# Patient Record
Sex: Female | Born: 1970 | Race: White | Hispanic: No | Marital: Married | Smoking: Never smoker
Health system: Southern US, Community
[De-identification: ages and names within clinical notes are randomized; demographics above are authoritative.]

## PROBLEM LIST (undated history)

## (undated) ENCOUNTER — Emergency Department (HOSPITAL_COMMUNITY): Payer: Self-pay | Source: Home / Self Care

## (undated) DIAGNOSIS — Q059 Spina bifida, unspecified: Secondary | ICD-10-CM

## (undated) DIAGNOSIS — T883XXA Malignant hyperthermia due to anesthesia, initial encounter: Secondary | ICD-10-CM

## (undated) DIAGNOSIS — Q0701 Arnold-Chiari syndrome with spina bifida: Secondary | ICD-10-CM

## (undated) DIAGNOSIS — G919 Hydrocephalus, unspecified: Secondary | ICD-10-CM

## (undated) DIAGNOSIS — Z9889 Other specified postprocedural states: Secondary | ICD-10-CM

## (undated) DIAGNOSIS — R112 Nausea with vomiting, unspecified: Secondary | ICD-10-CM

## (undated) DIAGNOSIS — M1990 Unspecified osteoarthritis, unspecified site: Secondary | ICD-10-CM

## (undated) DIAGNOSIS — K219 Gastro-esophageal reflux disease without esophagitis: Secondary | ICD-10-CM

## (undated) DIAGNOSIS — Z8489 Family history of other specified conditions: Secondary | ICD-10-CM

## (undated) DIAGNOSIS — G709 Myoneural disorder, unspecified: Secondary | ICD-10-CM

## (undated) HISTORY — DX: Hydrocephalus, unspecified: G91.9

## (undated) HISTORY — DX: Unspecified osteoarthritis, unspecified site: M19.90

## (undated) HISTORY — PX: ANKLE ARTHROSCOPY: SUR85

## (undated) HISTORY — DX: Arnold-Chiari syndrome with spina bifida: Q07.01

## (undated) HISTORY — PX: WISDOM TOOTH EXTRACTION: SHX21

## (undated) HISTORY — DX: Gastro-esophageal reflux disease without esophagitis: K21.9

## (undated) HISTORY — DX: Spina bifida, unspecified: Q05.9

## (undated) HISTORY — PX: SHUNT EXTERNALIZATION: SHX341

---
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
## 1999-09-16 ENCOUNTER — Encounter: Admission: RE | Admit: 1999-09-16 | Discharge: 1999-09-16 | Payer: Self-pay | Admitting: Neurosurgery

## 2000-01-29 ENCOUNTER — Other Ambulatory Visit: Admission: RE | Admit: 2000-01-29 | Discharge: 2000-01-29 | Payer: Self-pay | Admitting: *Deleted

## 2000-04-02 ENCOUNTER — Encounter: Payer: Self-pay | Admitting: Neurosurgery

## 2000-04-02 ENCOUNTER — Encounter: Admission: RE | Admit: 2000-04-02 | Discharge: 2000-04-02 | Payer: Self-pay | Admitting: Neurosurgery

## 2000-04-24 ENCOUNTER — Encounter: Payer: Self-pay | Admitting: Orthopaedic Surgery

## 2000-04-24 ENCOUNTER — Encounter: Admission: RE | Admit: 2000-04-24 | Discharge: 2000-04-24 | Payer: Self-pay | Admitting: Orthopaedic Surgery

## 2000-05-18 ENCOUNTER — Emergency Department (HOSPITAL_COMMUNITY): Admission: EM | Admit: 2000-05-18 | Discharge: 2000-05-18 | Payer: Self-pay | Admitting: Emergency Medicine

## 2000-05-19 ENCOUNTER — Encounter: Payer: Self-pay | Admitting: Emergency Medicine

## 2000-10-02 ENCOUNTER — Encounter: Payer: Self-pay | Admitting: *Deleted

## 2000-10-02 ENCOUNTER — Encounter (HOSPITAL_COMMUNITY): Admission: RE | Admit: 2000-10-02 | Discharge: 2000-12-31 | Payer: Self-pay | Admitting: *Deleted

## 2000-10-13 ENCOUNTER — Encounter: Payer: Self-pay | Admitting: *Deleted

## 2000-10-27 ENCOUNTER — Encounter: Payer: Self-pay | Admitting: *Deleted

## 2000-11-03 ENCOUNTER — Encounter: Payer: Self-pay | Admitting: *Deleted

## 2000-11-10 ENCOUNTER — Encounter: Payer: Self-pay | Admitting: *Deleted

## 2000-11-25 ENCOUNTER — Encounter: Payer: Self-pay | Admitting: *Deleted

## 2000-12-03 ENCOUNTER — Inpatient Hospital Stay (HOSPITAL_COMMUNITY): Admission: AD | Admit: 2000-12-03 | Discharge: 2000-12-03 | Payer: Self-pay | Admitting: Obstetrics

## 2000-12-08 ENCOUNTER — Encounter: Payer: Self-pay | Admitting: *Deleted

## 2000-12-13 ENCOUNTER — Inpatient Hospital Stay (HOSPITAL_COMMUNITY): Admission: AD | Admit: 2000-12-13 | Discharge: 2000-12-13 | Payer: Self-pay | Admitting: Obstetrics & Gynecology

## 2000-12-14 ENCOUNTER — Inpatient Hospital Stay (HOSPITAL_COMMUNITY): Admission: AD | Admit: 2000-12-14 | Discharge: 2000-12-14 | Payer: Self-pay | Admitting: Obstetrics

## 2000-12-22 ENCOUNTER — Inpatient Hospital Stay (HOSPITAL_COMMUNITY): Admission: AD | Admit: 2000-12-22 | Discharge: 2000-12-25 | Payer: Self-pay | Admitting: *Deleted

## 2001-01-07 ENCOUNTER — Encounter (HOSPITAL_COMMUNITY): Admission: RE | Admit: 2001-01-07 | Discharge: 2001-03-22 | Payer: Self-pay | Admitting: *Deleted

## 2001-01-11 ENCOUNTER — Inpatient Hospital Stay (HOSPITAL_COMMUNITY): Admission: AD | Admit: 2001-01-11 | Discharge: 2001-01-12 | Payer: Self-pay | Admitting: *Deleted

## 2001-01-12 ENCOUNTER — Encounter: Payer: Self-pay | Admitting: *Deleted

## 2001-03-11 ENCOUNTER — Inpatient Hospital Stay (HOSPITAL_COMMUNITY): Admission: AD | Admit: 2001-03-11 | Discharge: 2001-03-11 | Payer: Self-pay | Admitting: Obstetrics & Gynecology

## 2001-03-12 ENCOUNTER — Inpatient Hospital Stay (HOSPITAL_COMMUNITY): Admission: AD | Admit: 2001-03-12 | Discharge: 2001-03-12 | Payer: Self-pay | Admitting: *Deleted

## 2001-03-21 ENCOUNTER — Inpatient Hospital Stay (HOSPITAL_COMMUNITY): Admission: AD | Admit: 2001-03-21 | Discharge: 2001-03-24 | Payer: Self-pay | Admitting: *Deleted

## 2001-03-28 ENCOUNTER — Inpatient Hospital Stay (HOSPITAL_COMMUNITY): Admission: AD | Admit: 2001-03-28 | Discharge: 2001-03-28 | Payer: Self-pay | Admitting: Obstetrics & Gynecology

## 2001-04-01 ENCOUNTER — Encounter: Admission: RE | Admit: 2001-04-01 | Discharge: 2001-06-02 | Payer: Self-pay | Admitting: Sports Medicine

## 2001-04-30 ENCOUNTER — Inpatient Hospital Stay (HOSPITAL_COMMUNITY): Admission: AD | Admit: 2001-04-30 | Discharge: 2001-04-30 | Payer: Self-pay | Admitting: *Deleted

## 2001-04-30 ENCOUNTER — Encounter (INDEPENDENT_AMBULATORY_CARE_PROVIDER_SITE_OTHER): Payer: Self-pay | Admitting: Specialist

## 2003-09-28 ENCOUNTER — Encounter: Admission: RE | Admit: 2003-09-28 | Discharge: 2003-09-28 | Payer: Self-pay | Admitting: Family Medicine

## 2004-05-28 ENCOUNTER — Encounter: Admission: RE | Admit: 2004-05-28 | Discharge: 2004-05-28 | Payer: Self-pay | Admitting: Neurosurgery

## 2004-12-13 ENCOUNTER — Emergency Department (HOSPITAL_COMMUNITY): Admission: EM | Admit: 2004-12-13 | Discharge: 2004-12-13 | Payer: Self-pay | Admitting: Emergency Medicine

## 2005-06-04 ENCOUNTER — Encounter: Admission: RE | Admit: 2005-06-04 | Discharge: 2005-06-04 | Payer: Self-pay | Admitting: Neurosurgery

## 2005-06-24 ENCOUNTER — Emergency Department (HOSPITAL_COMMUNITY): Admission: EM | Admit: 2005-06-24 | Discharge: 2005-06-24 | Payer: Self-pay | Admitting: Emergency Medicine

## 2005-08-01 ENCOUNTER — Inpatient Hospital Stay (HOSPITAL_COMMUNITY): Admission: RE | Admit: 2005-08-01 | Discharge: 2005-08-05 | Payer: Self-pay | Admitting: Neurosurgery

## 2005-08-15 ENCOUNTER — Emergency Department (HOSPITAL_COMMUNITY): Admission: EM | Admit: 2005-08-15 | Discharge: 2005-08-16 | Payer: Self-pay | Admitting: Emergency Medicine

## 2005-09-25 ENCOUNTER — Encounter: Admission: RE | Admit: 2005-09-25 | Discharge: 2005-09-25 | Payer: Self-pay | Admitting: Neurosurgery

## 2005-11-07 ENCOUNTER — Encounter: Admission: RE | Admit: 2005-11-07 | Discharge: 2005-11-07 | Payer: Self-pay | Admitting: Neurosurgery

## 2005-11-17 ENCOUNTER — Encounter: Admission: RE | Admit: 2005-11-17 | Discharge: 2005-11-17 | Payer: Self-pay | Admitting: Neurosurgery

## 2005-11-27 ENCOUNTER — Encounter: Admission: RE | Admit: 2005-11-27 | Discharge: 2005-11-27 | Payer: Self-pay | Admitting: Neurosurgery

## 2006-01-23 ENCOUNTER — Encounter: Admission: RE | Admit: 2006-01-23 | Discharge: 2006-01-23 | Payer: Self-pay | Admitting: Neurosurgery

## 2006-08-31 ENCOUNTER — Encounter: Admission: RE | Admit: 2006-08-31 | Discharge: 2006-08-31 | Payer: Self-pay | Admitting: Neurosurgery

## 2007-02-10 ENCOUNTER — Encounter: Admission: RE | Admit: 2007-02-10 | Discharge: 2007-02-10 | Payer: Self-pay | Admitting: Neurosurgery

## 2007-03-01 ENCOUNTER — Encounter: Admission: RE | Admit: 2007-03-01 | Discharge: 2007-03-01 | Payer: Self-pay | Admitting: Neurosurgery

## 2007-06-11 ENCOUNTER — Encounter: Admission: RE | Admit: 2007-06-11 | Discharge: 2007-06-11 | Payer: Self-pay | Admitting: Neurosurgery

## 2007-08-18 ENCOUNTER — Encounter: Admission: RE | Admit: 2007-08-18 | Discharge: 2007-08-18 | Payer: Self-pay | Admitting: Neurosurgery

## 2008-01-11 ENCOUNTER — Encounter: Admission: RE | Admit: 2008-01-11 | Discharge: 2008-01-11 | Payer: Self-pay | Admitting: Neurosurgery

## 2008-07-04 ENCOUNTER — Encounter: Admission: RE | Admit: 2008-07-04 | Discharge: 2008-07-04 | Payer: Self-pay | Admitting: Neurosurgery

## 2009-03-05 ENCOUNTER — Encounter: Admission: RE | Admit: 2009-03-05 | Discharge: 2009-03-05 | Payer: Self-pay | Admitting: Neurosurgery

## 2009-06-01 ENCOUNTER — Encounter: Admission: RE | Admit: 2009-06-01 | Discharge: 2009-06-01 | Payer: Self-pay | Admitting: Neurosurgery

## 2009-06-06 ENCOUNTER — Encounter: Admission: RE | Admit: 2009-06-06 | Discharge: 2009-06-06 | Payer: Self-pay | Admitting: Neurosurgery

## 2009-06-20 ENCOUNTER — Encounter: Admission: RE | Admit: 2009-06-20 | Discharge: 2009-06-20 | Payer: Self-pay | Admitting: Neurosurgery

## 2010-08-26 ENCOUNTER — Encounter: Admission: RE | Admit: 2010-08-26 | Discharge: 2010-08-26 | Payer: Self-pay | Admitting: Neurosurgery

## 2010-10-23 ENCOUNTER — Encounter
Admission: RE | Admit: 2010-10-23 | Discharge: 2010-11-28 | Payer: Self-pay | Source: Home / Self Care | Attending: Neurosurgery | Admitting: Neurosurgery

## 2010-11-20 ENCOUNTER — Encounter
Admission: RE | Admit: 2010-11-20 | Discharge: 2010-12-31 | Payer: Self-pay | Source: Home / Self Care | Attending: Neurosurgery | Admitting: Neurosurgery

## 2010-12-01 HISTORY — PX: BACK SURGERY: SHX140

## 2010-12-03 ENCOUNTER — Encounter
Admission: RE | Admit: 2010-12-03 | Discharge: 2010-12-31 | Payer: Self-pay | Source: Home / Self Care | Attending: Neurosurgery | Admitting: Neurosurgery

## 2010-12-22 ENCOUNTER — Encounter: Payer: Self-pay | Admitting: Orthopaedic Surgery

## 2010-12-22 ENCOUNTER — Encounter: Payer: Self-pay | Admitting: Neurosurgery

## 2011-01-01 ENCOUNTER — Ambulatory Visit: Payer: Self-pay | Admitting: Physical Therapy

## 2011-01-03 ENCOUNTER — Ambulatory Visit: Payer: Self-pay | Admitting: Physical Therapy

## 2011-01-07 ENCOUNTER — Ambulatory Visit: Payer: Self-pay | Admitting: Physical Therapy

## 2011-01-07 ENCOUNTER — Ambulatory Visit: Payer: 59 | Attending: Neurosurgery | Admitting: Physical Therapy

## 2011-01-07 DIAGNOSIS — R269 Unspecified abnormalities of gait and mobility: Secondary | ICD-10-CM | POA: Insufficient documentation

## 2011-01-07 DIAGNOSIS — M6281 Muscle weakness (generalized): Secondary | ICD-10-CM | POA: Insufficient documentation

## 2011-01-07 DIAGNOSIS — IMO0001 Reserved for inherently not codable concepts without codable children: Secondary | ICD-10-CM | POA: Insufficient documentation

## 2011-01-09 ENCOUNTER — Ambulatory Visit: Payer: Self-pay | Admitting: Physical Therapy

## 2011-01-09 ENCOUNTER — Ambulatory Visit: Payer: 59 | Admitting: Physical Therapy

## 2011-01-13 ENCOUNTER — Ambulatory Visit: Payer: Self-pay | Admitting: Physical Therapy

## 2011-01-15 ENCOUNTER — Ambulatory Visit: Payer: 59 | Admitting: Physical Therapy

## 2011-01-20 ENCOUNTER — Ambulatory Visit: Payer: Self-pay | Admitting: Physical Therapy

## 2011-01-22 ENCOUNTER — Ambulatory Visit: Payer: 59 | Admitting: Physical Therapy

## 2011-01-24 ENCOUNTER — Ambulatory Visit: Payer: 59 | Admitting: Physical Therapy

## 2011-01-27 ENCOUNTER — Ambulatory Visit: Payer: 59 | Admitting: Physical Therapy

## 2011-01-30 ENCOUNTER — Ambulatory Visit: Payer: 59 | Attending: Neurosurgery | Admitting: Physical Therapy

## 2011-01-30 DIAGNOSIS — IMO0001 Reserved for inherently not codable concepts without codable children: Secondary | ICD-10-CM | POA: Insufficient documentation

## 2011-01-30 DIAGNOSIS — R269 Unspecified abnormalities of gait and mobility: Secondary | ICD-10-CM | POA: Insufficient documentation

## 2011-01-30 DIAGNOSIS — M6281 Muscle weakness (generalized): Secondary | ICD-10-CM | POA: Insufficient documentation

## 2011-02-04 ENCOUNTER — Ambulatory Visit: Payer: 59 | Admitting: Physical Therapy

## 2011-02-11 ENCOUNTER — Ambulatory Visit: Payer: 59 | Admitting: Physical Therapy

## 2011-02-17 ENCOUNTER — Ambulatory Visit: Payer: 59 | Admitting: Physical Therapy

## 2011-02-18 ENCOUNTER — Ambulatory Visit: Payer: 59 | Admitting: Physical Therapy

## 2011-04-18 NOTE — Op Note (Signed)
[name], RANDLE NO.:  [id number]   MEDICAL RECORD NO.:  [id number]          PATIENT TYPE:  INP   LOCATION:  2861                         FACILITY:  [facility]   PHYSICIAN:  Payton Doughty, M.D.      DATE OF BIRTH:  1971/08/06   DATE OF PROCEDURE:  08/01/2005  DATE OF DISCHARGE:                                 OPERATIVE REPORT   PREOPERATIVE DIAGNOSIS:  Hydrocephalus.   POSTOPERATIVE DIAGNOSIS:  Hydrocephalus.   OPERATION PERFORMED:  Right frontal ventriculoperitoneal shunt placement.   SURGEON:  Payton Doughty, M.D.   ANESTHESIA:  General endotracheal.   PREP:  Sterile Betadine prep and scrub with alcohol wipe.   COMPLICATIONS:  None.   NURSE ASSISTANT:  [place].   DOCTOR ASSISTANT:  Hilda Lias, M.D.   INDICATIONS FOR PROCEDURE:  The patient is a 40 year old female with  hydrocephalus.  She has not had a shunt for 35 years.   DESCRIPTION OF PROCEDURE:  The patient was taken to the operating room,  smoothly anesthetized, intubated.  Precautions were taken for malignant  hyperthermia and latex allergy.  Following shave, prep and drape in the  usual sterile fashion, skin was infiltrated with 1% lidocaine with  epinephrine 1:400,000 epinephrine and a small horseshoe shaped skin flap was  created in the right frontal area and a bur hole created.  Through this was  passed a catheter into the right frontal horn.  Estimated pressure was about  120 upon ventriculostomy entering the ventricle.  Tunnel was then created to  the right upper quadrant of the abdomen where through paramidline incision,  the rectus sheath, rectus abdominis muscle and the peritoneum were  identified, opened, confirmation of intestine was seen.  The adjustable  valve in line system was attached to the ventricular catheter which was  placed to 7 cm.  The catheter had been set at 100.  The tubing was passed  through the subcutaneous tunnel, passed into the abdomen with free flow of  CSF  noted.  Both incisions were irrigated and hemostasis assured and closed  with successive layers of 2-0 Vicryl and 3-0 nylon.  Betadine Telfa  dressings were applied.  The patient returned to recovery room in good  condition.           ______________________________  Payton Doughty, M.D.    MWR/MEDQ  D:  08/01/2005  T:  08/01/2005  Job:  [id number]

## 2011-04-18 NOTE — Discharge Summary (Signed)
[name], [name] NO.:  [id number]   MEDICAL RECORD NO.:  [id number]          PATIENT TYPE:  INP   LOCATION:  3005                         FACILITY:  [facility]   PHYSICIAN:  Payton Doughty, M.D.      DATE OF BIRTH:  08/17/1971   DATE OF ADMISSION:  08/01/2005  DATE OF DISCHARGE:  08/05/2005                                 DISCHARGE SUMMARY   ADMITTING DIAGNOSIS:  Hydrocephalus.   DISCHARGE DIAGNOSIS:  Hydrocephalus.   PROCEDURE:  Right frontal ventricular peritoneal shunt.   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  The shunt has not been  revised for 35 years.   Medical history is otherwise benign.   PHYSICAL EXAMINATION:  GENERAL:  Remarkable for obesity.  NEUROLOGIC:  Basically intact with head titubations and Lhermitte's  phenomenon with Valsalva.   She was admitted.  After ascertaining normal laboratory values, she  underwent a right frontal VP shunt.  Postoperatively she has done well.  She  spent the first couple of days in the ICU.  She had some complaints of  headache.  She also was febrile.  During the time of her fever, CBC, C-  reactive protein, chest x-ray , and urinalysis were negative.  Currently her  temperature is 98.4 to 99.5.  On exam she is awake, alert, and oriented.  Cranial nerves are intact.  There is no evidence of papilledema.  Incisions  are dry and well-healed.  She is being discharged home in the care of her  family.  Her followup will be in [facility] office in  10 days.           ______________________________  Payton Doughty, M.D.     MWR/MEDQ  D:  08/05/2005  T:  08/05/2005  Job:  [id number]

## 2011-04-18 NOTE — H&P (Signed)
NAMERASHENA, [name] NO.:  [id number]   MEDICAL RECORD NO.:  [id number]          PATIENT TYPE:  INP   LOCATION:                               FACILITY:  [facility]   PHYSICIAN:  Payton Doughty, M.D.      DATE OF BIRTH:  1971-11-21   DATE OF ADMISSION:  08/01/2005  DATE OF DISCHARGE:                                HISTORY & PHYSICAL   ADMITTING DIAGNOSIS:  Hydrocephalus.   HISTORY OF PRESENT ILLNESS:  This is a now 40 year old right-handed white  lady who had a history of myelomeningocele repair as a child, she was  shunted as an infant, been doing well, has some difficulty with lumbar  spondylosis, low back pain.  Visited with her periodically over the years  and then recently she has developed headaches that are Valsalva related, she  has limited titubation of the head and she complains of progressive weakness  in her arms and legs.  She had an MRI that demonstrated hydrocephalus and  the plan is for a right frontoventricular peritoneal shunt.   MEDICAL HISTORY:  Her medical history is remarkable for myelomeningocele  repaired as a child; she has had no abdominal operations.   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   SOCIAL HISTORY:  She does not smoke and does not drink.  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   FAMILY HISTORY:  Mom is 27 and with leukemia and malignant hyperthermia.  Father is 65, has cervical spondylosis and ___________.   REVIEW OF SYSTEMS:  Review of systems is remarkable for headache, dizziness,  dystaxia.   PHYSICAL EXAMINATION:  HEENT:  Within normal limits.  She does not have  papilledema.  She has reasonably good range of motion in her neck.  CHEST:  Chest clear.  Cardiac exam is regular rate and rhythm, heart rate is  in the 60s.  ABDOMEN:  Her abdomen is nontender; no hepatosplenomegaly.  EXTREMITIES:  Without clubbing or cyanosis.  Peripheral pulses are good.  NEUROLOGICAL:  She is awake, alert and oriented.  Cranial nerves are intact.  She does not have ___________ syndrome.  Pupils equal, round, reactive to  light.  Extraocular movements are intact.  Facial movements and sensation  are intact.  Tongue protrudes in the midline.  Shoulder shrug is normal.  She describes no swallowing difficulties.  Motor exam demonstrates 5/5  strength throughout the upper and lower extremities; she is slightly  dysmetric bilaterally.  Lower extremity strength she has weakness of the  dorsi and plantar flexors which is stable at her baseline.  GU:  Exam deferred.   CLINICAL DATA:  Her MR demonstrates hydrocephalus as noted above.   IMPRESSION:  There is compression of the brainstem secondary to Chiari.   PLAN:  The plan is for placement of a ventriculoperitoneal shunt.  We will  put in a programmable shunt and try to  set her at about 100.           ______________________________  Payton Doughty, M.D.     MWR/MEDQ  D:  08/01/2005  T:  08/01/2005  Job:  [id number]

## 2011-12-05 ENCOUNTER — Ambulatory Visit: Payer: 59

## 2011-12-05 DIAGNOSIS — J0100 Acute maxillary sinusitis, unspecified: Secondary | ICD-10-CM

## 2011-12-09 ENCOUNTER — Other Ambulatory Visit: Payer: Self-pay | Admitting: Neurosurgery

## 2011-12-17 ENCOUNTER — Other Ambulatory Visit: Payer: 59

## 2014-03-07 ENCOUNTER — Other Ambulatory Visit: Payer: Self-pay | Admitting: Neurosurgery

## 2014-03-07 DIAGNOSIS — M79605 Pain in left leg: Secondary | ICD-10-CM

## 2014-03-07 DIAGNOSIS — M79604 Pain in right leg: Secondary | ICD-10-CM

## 2014-03-07 DIAGNOSIS — M791 Myalgia, unspecified site: Secondary | ICD-10-CM

## 2014-03-09 ENCOUNTER — Ambulatory Visit
Admission: RE | Admit: 2014-03-09 | Discharge: 2014-03-09 | Disposition: A | Discharge status: Home / Self Care | Payer: 59 | Source: Ambulatory Visit | Attending: Neurosurgery | Admitting: Neurosurgery

## 2014-03-09 DIAGNOSIS — M79604 Pain in right leg: Secondary | ICD-10-CM

## 2014-03-09 DIAGNOSIS — M791 Myalgia, unspecified site: Secondary | ICD-10-CM

## 2014-03-09 DIAGNOSIS — M79605 Pain in left leg: Secondary | ICD-10-CM

## 2016-07-03 ENCOUNTER — Ambulatory Visit: Payer: Self-pay

## 2016-07-03 ENCOUNTER — Ambulatory Visit (INDEPENDENT_AMBULATORY_CARE_PROVIDER_SITE_OTHER): Payer: 59 | Admitting: Podiatry

## 2016-07-03 ENCOUNTER — Ambulatory Visit (INDEPENDENT_AMBULATORY_CARE_PROVIDER_SITE_OTHER): Payer: 59

## 2016-07-03 ENCOUNTER — Encounter: Payer: Self-pay | Admitting: Podiatry

## 2016-07-03 VITALS — BP 115/67 | HR 86 | Resp 16 | Ht 60.0 in | Wt 231.0 lb

## 2016-07-03 DIAGNOSIS — M722 Plantar fascial fibromatosis: Secondary | ICD-10-CM | POA: Diagnosis not present

## 2016-07-03 DIAGNOSIS — M79671 Pain in right foot: Secondary | ICD-10-CM | POA: Diagnosis not present

## 2016-07-03 DIAGNOSIS — M79672 Pain in left foot: Secondary | ICD-10-CM

## 2016-07-03 MED ORDER — TRIAMCINOLONE ACETONIDE 10 MG/ML IJ SUSP
10.0000 mg | Freq: Once | INTRAMUSCULAR | Status: AC
  Administered 2016-07-03: 10 mg

## 2016-07-03 NOTE — Patient Instructions (Signed)

## 2016-07-03 NOTE — Progress Notes (Signed)
   Subjective:    Patient ID: [name], female    DOB: [birth year], 45 y.o.   MRN: [id number]  HPI Chief Complaint  Patient presents with  . Foot Pain    Bilateral; heel & arch; pt stated, "Right foot swells all the time"; x1 month      Review of Systems  HENT: Positive for tinnitus.   Cardiovascular: Positive for leg swelling.  Neurological: Positive for dizziness and headaches.  All other systems reviewed and are negative.      Objective:   Physical Exam        Assessment & Plan:

## 2016-07-03 NOTE — Progress Notes (Signed)
Subjective:     Patient ID: [name], female   DOB: 1971-09-22, 46 y.o.   MRN: [id number]  HPI patient presents stating that she has developed heel pain in both heels and that she does have spina bifida which has started to become more symptomatic over the last 10 years causing her to use a walker   Review of Systems  All other systems reviewed and are negative.      Objective:   Physical Exam  Constitutional: She is oriented to person, place, and time.  Cardiovascular: Intact distal pulses.   Musculoskeletal: Normal range of motion.  Neurological: She is oriented to person, place, and time.  Skin: Skin is warm.  Nursing note and vitals reviewed.  neurovascular status was found to be intact with significant lower leg lymphedema secondary to pathological systemic condition with discomfort in the plantar heel region bilateral with fluid buildup around the medial tendon at the insertion into the calcaneus. Patient's found to have good digital perfusion and is well oriented 3     Assessment:     Plantar fasciitis bilateral with inflammation fluid buildup and lymphedema which does put more stress on the legs    Plan:     H&P and x-rays reviewed. Today I injected the plantar fascia bilateral 3 mg Kenalog 5 mg Xylocaine and discussed long-term orthotics. Patient be seen back for [place] to recheck again in the next several weeks or earlier if any issues should occur  X-rays reveal significant lower limb edema with no other significant pathology noted

## 2016-07-17 ENCOUNTER — Ambulatory Visit (INDEPENDENT_AMBULATORY_CARE_PROVIDER_SITE_OTHER): Payer: 59 | Admitting: Podiatry

## 2016-07-17 DIAGNOSIS — M722 Plantar fascial fibromatosis: Secondary | ICD-10-CM | POA: Diagnosis not present

## 2016-07-17 DIAGNOSIS — M79672 Pain in left foot: Secondary | ICD-10-CM

## 2016-07-17 MED ORDER — TRIAMCINOLONE ACETONIDE 10 MG/ML IJ SUSP
10.0000 mg | Freq: Once | INTRAMUSCULAR | Status: AC
  Administered 2016-07-17: 10 mg

## 2016-07-17 NOTE — Progress Notes (Signed)
Subjective:     Patient ID: Renee FreesBarbara L Bergeron, female   DOB: 12-14-1970, 45 y.o.   MRN: [id number]  HPI patient has plantar fasciitis of the heels that is improved but still quite tender when palpated   Review of Systems     Objective:   Physical Exam Neurovascular status intact muscle strength adequate range of motion within normal limits with patient found to have discomfort still in the plantar heels bilateral with fluid buildup noted. Patient has moderate depression of the arch and does have history of spina bifida which causes changes in gait    Assessment:     Plantar fasciitis bilateral with inflammation     Plan:     Reinjected the plantar fascia bilateral 3 mg Kenalog 5 mg Xylocaine and scanned for custom orthotics to reduce pressure against the feet

## 2016-08-12 ENCOUNTER — Other Ambulatory Visit: Payer: 59

## 2016-08-15 ENCOUNTER — Ambulatory Visit: Payer: 59 | Admitting: *Deleted

## 2016-08-15 DIAGNOSIS — M722 Plantar fascial fibromatosis: Secondary | ICD-10-CM

## 2016-08-15 NOTE — Patient Instructions (Signed)

## 2016-08-15 NOTE — Progress Notes (Signed)
Patient ID: Renee FreesBarbara L Beltran, female   DOB: Dec 26, 1970, 45 y.o.   MRN: [id number]  Patient presents for orthotic pick up.  Verbal and written break in and wear instructions given.  Patient will follow up in 4 weeks if symptoms worsen or fail to improve.

## 2018-04-12 ENCOUNTER — Emergency Department (HOSPITAL_COMMUNITY): Payer: No Typology Code available for payment source

## 2018-04-12 ENCOUNTER — Encounter (HOSPITAL_COMMUNITY): Payer: Self-pay | Admitting: Emergency Medicine

## 2018-04-12 ENCOUNTER — Other Ambulatory Visit: Payer: Self-pay

## 2018-04-12 ENCOUNTER — Emergency Department (HOSPITAL_COMMUNITY)
Admission: EM | Admit: 2018-04-12 | Discharge: 2018-04-12 | Disposition: A | Discharge status: Home / Self Care | Payer: No Typology Code available for payment source | Attending: Emergency Medicine | Admitting: Emergency Medicine

## 2018-04-12 DIAGNOSIS — S82851A Displaced trimalleolar fracture of right lower leg, initial encounter for closed fracture: Secondary | ICD-10-CM | POA: Insufficient documentation

## 2018-04-12 DIAGNOSIS — Z91040 Latex allergy status: Secondary | ICD-10-CM | POA: Insufficient documentation

## 2018-04-12 DIAGNOSIS — R52 Pain, unspecified: Secondary | ICD-10-CM

## 2018-04-12 DIAGNOSIS — S99911A Unspecified injury of right ankle, initial encounter: Secondary | ICD-10-CM | POA: Diagnosis present

## 2018-04-12 DIAGNOSIS — Y929 Unspecified place or not applicable: Secondary | ICD-10-CM | POA: Diagnosis not present

## 2018-04-12 DIAGNOSIS — Y939 Activity, unspecified: Secondary | ICD-10-CM | POA: Diagnosis not present

## 2018-04-12 DIAGNOSIS — Q059 Spina bifida, unspecified: Secondary | ICD-10-CM | POA: Diagnosis not present

## 2018-04-12 DIAGNOSIS — X501XXA Overexertion from prolonged static or awkward postures, initial encounter: Secondary | ICD-10-CM | POA: Insufficient documentation

## 2018-04-12 DIAGNOSIS — Y999 Unspecified external cause status: Secondary | ICD-10-CM | POA: Diagnosis not present

## 2018-04-12 HISTORY — DX: Malignant hyperthermia due to anesthesia, initial encounter: T88.3XXA

## 2018-04-12 MED ORDER — MORPHINE SULFATE (PF) 4 MG/ML IV SOLN
4.0000 mg | Freq: Once | INTRAVENOUS | Status: AC
  Administered 2018-04-12: 4 mg via INTRAVENOUS
  Filled 2018-04-12: qty 1

## 2018-04-12 MED ORDER — KETAMINE HCL 50 MG/5ML IJ SOSY
0.3000 mg/kg | PREFILLED_SYRINGE | Freq: Once | INTRAMUSCULAR | Status: AC
  Administered 2018-04-12: 37 mg via INTRAVENOUS
  Filled 2018-04-12: qty 5

## 2018-04-12 MED ORDER — HYDROCODONE-ACETAMINOPHEN 5-325 MG PO TABS
2.0000 | ORAL_TABLET | Freq: Once | ORAL | Status: AC
  Administered 2018-04-12: 2 via ORAL
  Filled 2018-04-12: qty 2

## 2018-04-12 MED ORDER — HYDROCODONE-ACETAMINOPHEN 5-325 MG PO TABS: 1.0000 | ORAL_TABLET | Freq: Four times a day (QID) | ORAL | 0 refills | Status: DC | PRN

## 2018-04-12 MED ORDER — ONDANSETRON HCL 4 MG/2ML IJ SOLN
4.0000 mg | Freq: Once | INTRAMUSCULAR | Status: AC
  Administered 2018-04-12: 4 mg via INTRAVENOUS
  Filled 2018-04-12: qty 2

## 2018-04-12 NOTE — ED Provider Notes (Signed)
[facility]-EMERGENCY DEPT Provider Note   CSN: [id number] Arrival date & time: 04/12/18  0756     History   Chief Complaint Chief Complaint  Patient presents with  . Ankle Pain    HPI [name] is a 47 y.o. female.  Patient is a 47 year old female with a history of Chiari malformation, hydrocephalus, malignant hyperthermia, spina bifida presenting today after a mechanical fall.  Patient uses a walker to ambulate and did not see a step-off on the pavement.  She twisted her right ankle and fell.  She has had severe pain 10 out of 10 in her right ankle and inability to stand and walk since this time.  She denies any knee pain or hip pain.  Her left leg feels normal.  She had no head injury or loss of consciousness.  She has chronic decreased sensation in her foot but states it feels about normal.  The history is provided by the patient.    Past Medical History:  Diagnosis Date  . Chiari malformation type II (HCC)   . GERD (gastroesophageal reflux disease)   . Hydrocephalus   . Malignant hyperthermia   . Osteoarthritis   . Spina bifida (HCC)     There are no active problems to display for this patient.   History reviewed. No pertinent surgical history.   OB History   None      Home Medications    Prior to Admission medications   Not on File    Family History No family history on file.  Social History Social History   Tobacco Use  . Smoking status: Never Smoker  . Smokeless tobacco: Never Used  Substance Use Topics  . Alcohol use: Never    Frequency: Never  . Drug use: Never     Allergies   Anesthetics, amide; Percocet [oxycodone-acetaminophen]; Latex; and Other   Review of Systems Review of Systems  All other systems reviewed and are negative.    Physical Exam Updated Vital Signs BP 129/70 (BP Location: Right Arm)   Pulse 68   Temp 98 F (36.7 C) (Oral)   Resp 16   Ht 5' (1.524 m)   Wt 122.5 kg (270 lb)    LMP 04/08/2018   SpO2 99%   BMI 52.73 kg/m   Physical Exam  Constitutional: She is oriented to person, place, and time. She appears well-developed and well-nourished. No distress.  HENT:  Head: Normocephalic and atraumatic.  Mouth/Throat: Oropharynx is clear and moist.  Eyes: Pupils are equal, round, and reactive to light. Conjunctivae and EOM are normal.  Neck: Normal range of motion. Neck supple.  Cardiovascular: Normal rate, regular rhythm and intact distal pulses.  No murmur heard. Pulmonary/Chest: Effort normal and breath sounds normal. No respiratory distress. She has no wheezes. She has no rales.  Abdominal: Soft. She exhibits no distension. There is no tenderness. There is no rebound and no guarding.  Musculoskeletal: She exhibits tenderness and deformity. She exhibits no edema.       Right ankle: She exhibits decreased range of motion, swelling, ecchymosis and deformity. She exhibits normal pulse. Tenderness. Lateral malleolus and medial malleolus tenderness found.  Pt is able to wiggle to toes and light touch is intact in the right foot  Neurological: She is alert and oriented to person, place, and time.  Skin: Skin is warm and dry. No rash noted. No erythema.  Psychiatric: She has a normal mood and affect. Her behavior is normal.  Nursing note  and vitals reviewed.    ED Treatments / Results  Labs (all labs ordered are listed, but only abnormal results are displayed) Labs Reviewed - No data to display  EKG None  Radiology Dg Ankle 2 Views Right  Result Date: 04/12/2018 CLINICAL DATA:  Post reduction EXAM: RIGHT ANKLE - 2 VIEW COMPARISON:  04/12/2018 FINDINGS: Comminuted fracture through the distal right fibular metaphysis with mild displacement. Fracture through the base of the medial malleolus. Probable posterior malleolar fracture. Disruption of the ankle mortise. Interval reduction of the previously seen dislocated talus. IMPRESSION: Trimalleolar fracture with  disruption of the ankle mortise. Interval reduction of the dislocated talus. Electronically Signed   By: Charlett Nose M.D.   On: 04/12/2018 10:03   Dg Ankle 2 Views Right  Result Date: 04/12/2018 CLINICAL DATA:  Status post fall. EXAM: RIGHT ANKLE - 2 VIEW COMPARISON:  None. FINDINGS: Generalized osteopenia. Comminuted oblique fracture of the distal fibular diaphysis with apex medial angulation and 9 mm of lateral displacement. Comminuted and displaced medial malleolar fracture with 14 mm of lateral displacement. Disruption of the ankle mortise with the talar dome dislocated laterally by 17 mm. Displaced posterior malleolar fracture of the distal tibia displaced laterally. No other fracture or dislocation. IMPRESSION: 1. Trimalleolar displaced fracture of right ankle. Lateral dislocation of the talar dome relative to the tibial plafond. Electronically Signed   By: Elige Ko   On: 04/12/2018 09:07    Procedures Reduction of fracture Date/Time: 04/12/2018 9:42 AM Performed by: Gwyneth Sprout, MD Authorized by: Gwyneth Sprout, MD  Consent: Verbal consent obtained. Consent given by: patient Patient understanding: patient states understanding of the procedure being performed Relevant documents: relevant documents present and verified Imaging studies: imaging studies available Patient identity confirmed: verbally with patient Preparation: Patient was prepped and draped in the usual sterile fashion. Local anesthesia used: no  Anesthesia: Local anesthesia used: no  Sedation: Patient sedated: yes Analgesia: morphine and ketamine Vitals: Vital signs were monitored during sedation.  Patient tolerance: Patient tolerated the procedure well with no immediate complications Comments: Patient given analgesic doses of morphine and ketamine.  No procedural sedation was done.  Reduction of fracture dislocation of the ankle.  Gentle traction and external rotation of the ankle with visually better  alignment afterwards.  A splint was placed.  2+ DP pulse present after the procedure.    (including critical care time)  Medications Ordered in ED Medications  morphine 4 MG/ML injection 4 mg (has no administration in time range)  ondansetron (ZOFRAN) injection 4 mg (has no administration in time range)     Initial Impression / Assessment and Plan / ED Course  I have reviewed the triage vital signs and the nursing notes.  Pertinent labs & imaging results that were available during my care of the patient were reviewed by me and considered in my medical decision making (see chart for details).     Patient presenting after mechanical fall with pain and deformity of the right ankle.  Concern for fracture dislocation.  Currently neurovascularly intact.  No other injury.  Ankle films are pending.  Patient given morphine.  9:41 AM X-ray shows a trimalleolar displaced fracture of the right ankle with lateral dislocation of the talar dome.  Patient given ketamine and morphine and analgesic doses and ankle was reduced at bedside and a Cadillac splint was placed.  Repeat postreduction films with improved alignment.  11:21 AM Spoke with Dr. Aundria Rud who will f/u with the pt in 1 week and  surgery in 2 Final Clinical Impressions(s) / ED Diagnoses   Final diagnoses:  Closed trimalleolar fracture of right ankle, initial encounter    ED Discharge Orders        Ordered    HYDROcodone-acetaminophen (NORCO/VICODIN) 5-325 MG tablet  Every 6 hours PRN     04/12/18 1122       Gwyneth Sprout, MD 04/12/18 1122

## 2018-04-12 NOTE — ED Notes (Signed)
EDP and ortho tech at bedside for splint placement

## 2018-04-12 NOTE — ED Notes (Signed)
Patient transported to X-ray 

## 2018-04-12 NOTE — ED Notes (Signed)
Pt denies pain, alert, relaxed.

## 2018-04-12 NOTE — ED Triage Notes (Addendum)
R ankle pain after tripping on uneven cement today at work. Denies hitting head/LOC. Splint in place by EMS.

## 2018-04-12 NOTE — Discharge Instructions (Signed)
Make sure you are not putting any weight on your foot.  Keep it elevated to help with swelling.

## 2018-04-12 NOTE — ED Notes (Signed)
Bed: [facility] Expected date:  Expected time:  Means of arrival:  Comments: 

## 2018-04-20 NOTE — Patient Instructions (Addendum)
[name]  04/20/2018   Your procedure is scheduled on: 04-23-18   Report to [facility] Main  Entrance    Report to Admitting at 5:30 AM    Call this number if you have problems the morning of surgery [phone]   Remember: Do not eat food or drink liquids :After Midnight.     Take these medicines the morning of surgery with A SIP OF WATER: None                                 You may not have any metal on your body including hair pins and              piercings  Do not wear jewelry, make-up, lotions, powders or perfumes, deodorant             Do not wear nail polish.  Do not shave  48 hours prior to surgery.                 Do not bring valuables to the hospital. [facility] IS NOT             RESPONSIBLE   FOR VALUABLES.  Contacts, dentures or bridgework may not be worn into surgery.  Leave suitcase in the car. After surgery it may be brought to your room.      Special Instructions: N/A              Please read over the following fact sheets you were given: _____________________________________________________________________          [facility] - Preparing for Surgery Before surgery, you can play an important role.  Because skin is not sterile, your skin needs to be as free of germs as possible.  You can reduce the number of germs on your skin by washing with CHG (chlorahexidine gluconate) soap before surgery.  CHG is an antiseptic cleaner which kills germs and bonds with the skin to continue killing germs even after washing. Please DO NOT use if you have an allergy to CHG or antibacterial soaps.  If your skin becomes reddened/irritated stop using the CHG and inform your nurse when you arrive at Short Stay. Do not shave (including legs and underarms) for at least 48 hours prior to the first CHG shower.  You may shave your face/neck. Please follow these instructions carefully:  1.  Shower with CHG Soap the night before surgery and the   morning of Surgery.  2.  If you choose to wash your hair, wash your hair first as usual with your  normal  shampoo.  3.  After you shampoo, rinse your hair and body thoroughly to remove the  shampoo.                           4.  Use CHG as you would any other liquid soap.  You can apply chg directly  to the skin and wash                       Gently with a scrungie or clean washcloth.  5.  Apply the CHG Soap to your body ONLY FROM THE NECK DOWN.   Do not use on face/ open  Wound or open sores. Avoid contact with eyes, ears mouth and genitals (private parts).                       Wash face,  Genitals (private parts) with your normal soap.             6.  Wash thoroughly, paying special attention to the area where your surgery  will be performed.  7.  Thoroughly rinse your body with warm water from the neck down.  8.  DO NOT shower/wash with your normal soap after using and rinsing off  the CHG Soap.                9.  Pat yourself dry with a clean towel.            10.  Wear clean pajamas.            11.  Place clean sheets on your bed the night of your first shower and do not  sleep with pets. Day of Surgery : Do not apply any lotions/deodorants the morning of surgery.  Please wear clean clothes to the hospital/surgery center.  FAILURE TO FOLLOW THESE INSTRUCTIONS MAY RESULT IN THE CANCELLATION OF YOUR SURGERY PATIENT SIGNATURE_________________________________  NURSE SIGNATURE__________________________________  ________________________________________________________________________

## 2018-04-21 ENCOUNTER — Encounter (HOSPITAL_COMMUNITY): Payer: Self-pay

## 2018-04-21 ENCOUNTER — Other Ambulatory Visit: Payer: Self-pay

## 2018-04-21 ENCOUNTER — Encounter (HOSPITAL_COMMUNITY)
Admission: RE | Admit: 2018-04-21 | Discharge: 2018-04-21 | Disposition: A | Discharge status: Home / Self Care | Payer: No Typology Code available for payment source | Source: Ambulatory Visit | Attending: Orthopedic Surgery | Admitting: Orthopedic Surgery

## 2018-04-21 DIAGNOSIS — Z01812 Encounter for preprocedural laboratory examination: Secondary | ICD-10-CM | POA: Diagnosis not present

## 2018-04-21 LAB — BASIC METABOLIC PANEL
Anion gap: 12 (ref 5–15)
BUN: 22 mg/dL — ABNORMAL HIGH (ref 6–20)
CO2: 23 mmol/L (ref 22–32)
CREATININE: 0.88 mg/dL (ref 0.44–1.00)
Calcium: 9.6 mg/dL (ref 8.9–10.3)
Chloride: 104 mmol/L (ref 101–111)
Glucose, Bld: 99 mg/dL (ref 65–99)
POTASSIUM: 4.8 mmol/L (ref 3.5–5.1)
Sodium: 139 mmol/L (ref 135–145)

## 2018-04-21 LAB — CBC
HEMATOCRIT: 43.5 % (ref 36.0–46.0)
HEMOGLOBIN: 14.3 g/dL (ref 12.0–15.0)
MCH: 30 pg (ref 26.0–34.0)
MCHC: 32.9 g/dL (ref 30.0–36.0)
MCV: 91.4 fL (ref 78.0–100.0)
PLATELETS: 351 10*3/uL (ref 150–400)
RBC: 4.76 MIL/uL (ref 3.87–5.11)
RDW: 13.6 % (ref 11.5–15.5)
WBC: 8.8 10*3/uL (ref 4.0–10.5)

## 2018-04-21 LAB — HCG, SERUM, QUALITATIVE: PREG SERUM: NEGATIVE

## 2018-04-21 NOTE — Progress Notes (Signed)
Pt had an Anesthesia consult with Dr. Acey Lav regarding her Malignant Hyperthermia diagnosis.

## 2018-04-22 MED ORDER — CEFAZOLIN SODIUM 10 G IJ SOLR
3.0000 g | INTRAMUSCULAR | Status: AC
  Administered 2018-04-23: 3 g via INTRAVENOUS
  Filled 2018-04-22: qty 3

## 2018-04-23 ENCOUNTER — Inpatient Hospital Stay (HOSPITAL_COMMUNITY): Payer: No Typology Code available for payment source

## 2018-04-23 ENCOUNTER — Inpatient Hospital Stay (HOSPITAL_COMMUNITY): Payer: No Typology Code available for payment source | Admitting: Certified Registered Nurse Anesthetist

## 2018-04-23 ENCOUNTER — Encounter (HOSPITAL_COMMUNITY)
Admission: RE | Disposition: A | Discharge status: Rehabilitation Facility | Payer: Self-pay | Source: Ambulatory Visit | Attending: Orthopedic Surgery

## 2018-04-23 ENCOUNTER — Inpatient Hospital Stay (HOSPITAL_COMMUNITY)
Admission: RE | Admit: 2018-04-23 | Discharge: 2018-04-29 | DRG: 493 | Disposition: A | Discharge status: Rehabilitation Facility | Payer: No Typology Code available for payment source | Source: Ambulatory Visit | Attending: Orthopedic Surgery | Admitting: Orthopedic Surgery

## 2018-04-23 ENCOUNTER — Encounter (HOSPITAL_COMMUNITY): Payer: Self-pay

## 2018-04-23 ENCOUNTER — Other Ambulatory Visit: Payer: Self-pay

## 2018-04-23 DIAGNOSIS — Z885 Allergy status to narcotic agent status: Secondary | ICD-10-CM | POA: Diagnosis not present

## 2018-04-23 DIAGNOSIS — Y990 Civilian activity done for income or pay: Secondary | ICD-10-CM | POA: Diagnosis not present

## 2018-04-23 DIAGNOSIS — Z6841 Body Mass Index (BMI) 40.0 and over, adult: Secondary | ICD-10-CM | POA: Diagnosis not present

## 2018-04-23 DIAGNOSIS — Q059 Spina bifida, unspecified: Secondary | ICD-10-CM | POA: Diagnosis not present

## 2018-04-23 DIAGNOSIS — Z888 Allergy status to other drugs, medicaments and biological substances status: Secondary | ICD-10-CM | POA: Diagnosis not present

## 2018-04-23 DIAGNOSIS — W19XXXA Unspecified fall, initial encounter: Secondary | ICD-10-CM | POA: Diagnosis present

## 2018-04-23 DIAGNOSIS — Z982 Presence of cerebrospinal fluid drainage device: Secondary | ICD-10-CM

## 2018-04-23 DIAGNOSIS — Z91048 Other nonmedicinal substance allergy status: Secondary | ICD-10-CM

## 2018-04-23 DIAGNOSIS — Z87728 Personal history of other specified (corrected) congenital malformations of nervous system and sense organs: Secondary | ICD-10-CM | POA: Diagnosis not present

## 2018-04-23 DIAGNOSIS — Z79899 Other long term (current) drug therapy: Secondary | ICD-10-CM | POA: Diagnosis not present

## 2018-04-23 DIAGNOSIS — S82851A Displaced trimalleolar fracture of right lower leg, initial encounter for closed fracture: Secondary | ICD-10-CM | POA: Diagnosis present

## 2018-04-23 DIAGNOSIS — M1990 Unspecified osteoarthritis, unspecified site: Secondary | ICD-10-CM | POA: Diagnosis present

## 2018-04-23 DIAGNOSIS — Q0703 Arnold-Chiari syndrome with spina bifida and hydrocephalus: Secondary | ICD-10-CM

## 2018-04-23 DIAGNOSIS — R2689 Other abnormalities of gait and mobility: Secondary | ICD-10-CM | POA: Diagnosis not present

## 2018-04-23 DIAGNOSIS — S82891A Other fracture of right lower leg, initial encounter for closed fracture: Secondary | ICD-10-CM

## 2018-04-23 DIAGNOSIS — S82851D Displaced trimalleolar fracture of right lower leg, subsequent encounter for closed fracture with routine healing: Secondary | ICD-10-CM | POA: Diagnosis not present

## 2018-04-23 DIAGNOSIS — Q052 Lumbar spina bifida with hydrocephalus: Secondary | ICD-10-CM | POA: Diagnosis not present

## 2018-04-23 DIAGNOSIS — Z8781 Personal history of (healed) traumatic fracture: Secondary | ICD-10-CM | POA: Diagnosis not present

## 2018-04-23 DIAGNOSIS — Z884 Allergy status to anesthetic agent status: Secondary | ICD-10-CM | POA: Diagnosis not present

## 2018-04-23 DIAGNOSIS — D62 Acute posthemorrhagic anemia: Secondary | ICD-10-CM | POA: Diagnosis not present

## 2018-04-23 DIAGNOSIS — Z8669 Personal history of other diseases of the nervous system and sense organs: Secondary | ICD-10-CM | POA: Diagnosis not present

## 2018-04-23 DIAGNOSIS — S82851S Displaced trimalleolar fracture of right lower leg, sequela: Secondary | ICD-10-CM | POA: Diagnosis not present

## 2018-04-23 DIAGNOSIS — E46 Unspecified protein-calorie malnutrition: Secondary | ICD-10-CM | POA: Diagnosis not present

## 2018-04-23 DIAGNOSIS — S82852A Displaced trimalleolar fracture of left lower leg, initial encounter for closed fracture: Secondary | ICD-10-CM | POA: Diagnosis not present

## 2018-04-23 HISTORY — PX: ORIF ANKLE FRACTURE: SHX5408

## 2018-04-23 LAB — CBC
HEMATOCRIT: 41.4 % (ref 36.0–46.0)
HEMOGLOBIN: 13.7 g/dL (ref 12.0–15.0)
MCH: 30.4 pg (ref 26.0–34.0)
MCHC: 33.1 g/dL (ref 30.0–36.0)
MCV: 92 fL (ref 78.0–100.0)
Platelets: 328 10*3/uL (ref 150–400)
RBC: 4.5 MIL/uL (ref 3.87–5.11)
RDW: 13.5 % (ref 11.5–15.5)
WBC: 11.4 10*3/uL — ABNORMAL HIGH (ref 4.0–10.5)

## 2018-04-23 LAB — CREATININE, SERUM
Creatinine, Ser: 0.85 mg/dL (ref 0.44–1.00)
GFR calc Af Amer: >60 mL/min (ref >60)
GFR calc non Af Amer: >60 mL/min (ref >60)

## 2018-04-23 SURGERY — OPEN REDUCTION INTERNAL FIXATION (ORIF) ANKLE FRACTURE
Anesthesia: General | Laterality: Right

## 2018-04-23 MED ORDER — METHOCARBAMOL 500 MG PO TABS
500.0000 mg | ORAL_TABLET | Freq: Four times a day (QID) | ORAL | Status: DC | PRN
  Administered 2018-04-24 – 2018-04-25 (×2): 500 mg via ORAL
  Filled 2018-04-23 (×3): qty 1

## 2018-04-23 MED ORDER — EPHEDRINE SULFATE-NACL 50-0.9 MG/10ML-% IV SOSY
PREFILLED_SYRINGE | INTRAVENOUS | Status: DC | PRN
  Administered 2018-04-23: 10 mg via INTRAVENOUS

## 2018-04-23 MED ORDER — FENTANYL CITRATE (PF) 100 MCG/2ML IJ SOLN
INTRAMUSCULAR | Status: DC | PRN
  Administered 2018-04-23: 100 ug via INTRAVENOUS
  Administered 2018-04-23: 50 ug via INTRAVENOUS

## 2018-04-23 MED ORDER — ISOPROPYL ALCOHOL 70 % SOLN
Status: AC
  Filled 2018-04-23: qty 480

## 2018-04-23 MED ORDER — DOCUSATE SODIUM 100 MG PO CAPS
100.0000 mg | ORAL_CAPSULE | Freq: Two times a day (BID) | ORAL | Status: DC
  Administered 2018-04-23 – 2018-04-29 (×12): 100 mg via ORAL
  Filled 2018-04-23 (×12): qty 1

## 2018-04-23 MED ORDER — HYDROMORPHONE HCL 1 MG/ML IJ SOLN
INTRAMUSCULAR | Status: AC
  Filled 2018-04-23: qty 1

## 2018-04-23 MED ORDER — ONDANSETRON HCL 4 MG PO TABS: 4.0000 mg | ORAL_TABLET | Freq: Four times a day (QID) | ORAL | Status: DC | PRN

## 2018-04-23 MED ORDER — ENOXAPARIN SODIUM 40 MG/0.4ML ~~LOC~~ SOLN: 40.0000 mg | SUBCUTANEOUS | 0 refills | Status: DC

## 2018-04-23 MED ORDER — ONDANSETRON HCL 4 MG/2ML IJ SOLN
INTRAMUSCULAR | Status: DC | PRN
  Administered 2018-04-23: 4 mg via INTRAVENOUS

## 2018-04-23 MED ORDER — MORPHINE SULFATE (PF) 2 MG/ML IV SOLN
2.0000 mg | INTRAVENOUS | Status: DC | PRN
  Administered 2018-04-23 – 2018-04-24 (×2): 2 mg via INTRAVENOUS
  Filled 2018-04-23 (×2): qty 1

## 2018-04-23 MED ORDER — PROPOFOL 10 MG/ML IV BOLUS
INTRAVENOUS | Status: DC | PRN
  Administered 2018-04-23: 200 mg via INTRAVENOUS

## 2018-04-23 MED ORDER — ENOXAPARIN SODIUM 40 MG/0.4ML ~~LOC~~ SOLN
40.0000 mg | SUBCUTANEOUS | Status: DC
  Administered 2018-04-23 – 2018-04-28 (×5): 40 mg via SUBCUTANEOUS
  Filled 2018-04-23 (×7): qty 0.4

## 2018-04-23 MED ORDER — PROPOFOL 10 MG/ML IV BOLUS
INTRAVENOUS | Status: AC
  Filled 2018-04-23: qty 20

## 2018-04-23 MED ORDER — HYDROCODONE-ACETAMINOPHEN 7.5-325 MG PO TABS
1.0000 | ORAL_TABLET | ORAL | Status: DC | PRN
  Administered 2018-04-23 – 2018-04-24 (×2): 2 via ORAL
  Administered 2018-04-24 – 2018-04-27 (×5): 1 via ORAL
  Filled 2018-04-23 (×2): qty 1
  Filled 2018-04-23: qty 2
  Filled 2018-04-23: qty 1
  Filled 2018-04-23: qty 2
  Filled 2018-04-23: qty 1
  Filled 2018-04-23: qty 2
  Filled 2018-04-23: qty 1

## 2018-04-23 MED ORDER — CHLORHEXIDINE GLUCONATE 4 % EX LIQD: 60.0000 mL | Freq: Once | CUTANEOUS | Status: DC

## 2018-04-23 MED ORDER — PROPOFOL 1000 MG/100ML IV EMUL
5.0000 ug/kg/min | INTRAVENOUS | Status: DC
  Administered 2018-04-23: 150 ug/kg/min via INTRAVENOUS
  Filled 2018-04-23 (×2): qty 100

## 2018-04-23 MED ORDER — HYDROMORPHONE HCL 1 MG/ML IJ SOLN
0.2500 mg | INTRAMUSCULAR | Status: DC | PRN
  Administered 2018-04-23 (×2): 0.25 mg via INTRAVENOUS
  Administered 2018-04-23: 0.5 mg via INTRAVENOUS

## 2018-04-23 MED ORDER — MIDAZOLAM HCL 5 MG/5ML IJ SOLN
INTRAMUSCULAR | Status: DC | PRN
  Administered 2018-04-23: 2 mg via INTRAVENOUS

## 2018-04-23 MED ORDER — HYDROCODONE-ACETAMINOPHEN 7.5-325 MG PO TABS: 1.0000 | ORAL_TABLET | Freq: Four times a day (QID) | ORAL | 0 refills | Status: DC | PRN

## 2018-04-23 MED ORDER — LIDOCAINE 2% (20 MG/ML) 5 ML SYRINGE
INTRAMUSCULAR | Status: DC | PRN
  Administered 2018-04-23: 60 mg via INTRAVENOUS

## 2018-04-23 MED ORDER — DULOXETINE HCL 30 MG PO CPEP
30.0000 mg | ORAL_CAPSULE | Freq: Every day | ORAL | Status: DC
  Administered 2018-04-23 – 2018-04-28 (×6): 30 mg via ORAL
  Filled 2018-04-23 (×6): qty 1

## 2018-04-23 MED ORDER — ROPIVACAINE HCL 5 MG/ML IJ SOLN
INTRAMUSCULAR | Status: DC | PRN
  Administered 2018-04-23: 50 mL via PERINEURAL

## 2018-04-23 MED ORDER — ACETAMINOPHEN 325 MG PO TABS: 650.0000 mg | ORAL_TABLET | Freq: Four times a day (QID) | ORAL | Status: DC | PRN

## 2018-04-23 MED ORDER — LACTATED RINGERS IV SOLN
INTRAVENOUS | Status: DC | PRN
  Administered 2018-04-23 (×2): via INTRAVENOUS

## 2018-04-23 MED ORDER — DEXAMETHASONE SODIUM PHOSPHATE 10 MG/ML IJ SOLN
INTRAMUSCULAR | Status: DC | PRN
  Administered 2018-04-23: 5 mg via INTRAVENOUS

## 2018-04-23 MED ORDER — FENTANYL CITRATE (PF) 250 MCG/5ML IJ SOLN
INTRAMUSCULAR | Status: AC
  Filled 2018-04-23: qty 5

## 2018-04-23 MED ORDER — MIDAZOLAM HCL 2 MG/2ML IJ SOLN
INTRAMUSCULAR | Status: AC
  Filled 2018-04-23: qty 2

## 2018-04-23 MED ORDER — PROMETHAZINE HCL 25 MG/ML IJ SOLN: 6.2500 mg | INTRAMUSCULAR | Status: DC | PRN

## 2018-04-23 MED ORDER — MEPERIDINE HCL 50 MG/ML IJ SOLN: 6.2500 mg | INTRAMUSCULAR | Status: DC | PRN

## 2018-04-23 MED ORDER — ONDANSETRON HCL 4 MG/2ML IJ SOLN: 4.0000 mg | Freq: Four times a day (QID) | INTRAMUSCULAR | Status: DC | PRN

## 2018-04-23 MED ORDER — ACETAMINOPHEN 650 MG RE SUPP: 650.0000 mg | Freq: Four times a day (QID) | RECTAL | Status: DC | PRN

## 2018-04-23 MED ORDER — METHOCARBAMOL 1000 MG/10ML IJ SOLN
500.0000 mg | Freq: Four times a day (QID) | INTRAVENOUS | Status: DC | PRN
  Administered 2018-04-23: 500 mg via INTRAVENOUS
  Filled 2018-04-23: qty 550

## 2018-04-23 MED ORDER — 0.9 % SODIUM CHLORIDE (POUR BTL) OPTIME
TOPICAL | Status: DC | PRN
  Administered 2018-04-23: 1000 mL

## 2018-04-23 MED ORDER — IBUPROFEN 400 MG PO TABS
400.0000 mg | ORAL_TABLET | Freq: Three times a day (TID) | ORAL | Status: DC | PRN
  Administered 2018-04-24: 400 mg via ORAL
  Filled 2018-04-23: qty 1
  Filled 2018-04-23: qty 2

## 2018-04-23 SURGICAL SUPPLY — 57 items
BAG SPEC THK2 15X12 ZIP CLS (MISCELLANEOUS) ×1
BAG ZIPLOCK 12X15 (MISCELLANEOUS) ×3 IMPLANT
BANDAGE ACE 4X5 VEL STRL LF (GAUZE/BANDAGES/DRESSINGS) ×3 IMPLANT
BANDAGE ACE 6X5 VEL STRL LF (GAUZE/BANDAGES/DRESSINGS) ×3 IMPLANT
BIT DRILL 2.5 CANN LNG (BIT) ×2 IMPLANT
BIT DRILL 2.6 CANN (BIT) ×2 IMPLANT
CLOSURE WOUND 1/2 X4 (GAUZE/BANDAGES/DRESSINGS) ×1
COVER SURGICAL LIGHT HANDLE (MISCELLANEOUS) ×3 IMPLANT
CUFF TOURN SGL QUICK 34 (TOURNIQUET CUFF) ×3
CUFF TRNQT CYL 34X4X40X1 (TOURNIQUET CUFF) ×1 IMPLANT
DRAPE C-ARM 42X120 X-RAY (DRAPES) ×3 IMPLANT
DRAPE C-ARMOR (DRAPES) ×3 IMPLANT
DRAPE LG THREE QUARTER DISP (DRAPES) ×3 IMPLANT
DRAPE U-SHAPE 47X51 STRL (DRAPES) ×3 IMPLANT
DRSG ADAPTIC 3X8 NADH LF (GAUZE/BANDAGES/DRESSINGS) ×5 IMPLANT
DRSG PAD ABDOMINAL 8X10 ST (GAUZE/BANDAGES/DRESSINGS) ×3 IMPLANT
DURAPREP 26ML APPLICATOR (WOUND CARE) ×3 IMPLANT
ELECT REM PT RETURN 15FT ADLT (MISCELLANEOUS) ×3 IMPLANT
GAUZE SPONGE 4X4 12PLY STRL (GAUZE/BANDAGES/DRESSINGS) ×6 IMPLANT
GLOVE BIO SURGEON STRL SZ7.5 (GLOVE) ×6 IMPLANT
GLOVE BIOGEL PI IND STRL 8 (GLOVE) ×1 IMPLANT
GLOVE BIOGEL PI INDICATOR 8 (GLOVE) ×2
GOWN STRL REUS W/TWL LRG LVL3 (GOWN DISPOSABLE) ×3 IMPLANT
GUIDEWIRE 1.35MM (WIRE) ×2 IMPLANT
IMPL TIGHTROP W/DRV K-LESS (Anchor) IMPLANT
IMPLANT TIGHTROPE W/DRV K-LESS (Anchor) ×6 IMPLANT
MANIFOLD NEPTUNE II (INSTRUMENTS) ×3 IMPLANT
NDL SAFETY ECLIPSE 18X1.5 (NEEDLE) ×1 IMPLANT
NEEDLE HYPO 18GX1.5 SHARP (NEEDLE) ×3
NEEDLE HYPO 22GX1.5 SAFETY (NEEDLE) ×3 IMPLANT
NS IRRIG 1000ML POUR BTL (IV SOLUTION) ×3 IMPLANT
PACK TOTAL JOINT (CUSTOM PROCEDURE TRAY) ×3 IMPLANT
PAD ABD 8X10 STRL (GAUZE/BANDAGES/DRESSINGS) ×4 IMPLANT
PAD CAST 4YDX4 CTTN HI CHSV (CAST SUPPLIES) ×2 IMPLANT
PADDING CAST COTTON 4X4 STRL (CAST SUPPLIES) ×6
PADDING CAST COTTON 6X4 STRL (CAST SUPPLIES) ×5 IMPLANT
PLATE ANKLE 98 10H 1/3 TUBUAL (Plate) ×2 IMPLANT
POSITIONER SURGICAL ARM (MISCELLANEOUS) ×3 IMPLANT
SCREW CANCELLOUS LP 4.0X18 (Screw) ×2 IMPLANT
SCREW CANN 4.0X30MM LP THD (Screw) ×4 IMPLANT
SCREW LOW PROFILE 3.5X16 (Screw) ×4 IMPLANT
SCREW NLOCK T15 FT 18X3.5XST (Screw) IMPLANT
SCREW NON LOCK 3.5X18MM (Screw) ×3 IMPLANT
SCREW NON LOCKING 4.0X20 (Screw) ×2 IMPLANT
SCREW NON-LOCKING 3.5X12MM (Screw) ×2 IMPLANT
SPLINT FIBERGLASS 4X30 (CAST SUPPLIES) ×4 IMPLANT
STRIP CLOSURE SKIN 1/2X4 (GAUZE/BANDAGES/DRESSINGS) ×2 IMPLANT
SUT ETHILON 3 0 PS 1 (SUTURE) ×3 IMPLANT
SUT MNCRL AB 4-0 PS2 18 (SUTURE) ×3 IMPLANT
SUT VIC AB 0 CT1 36 (SUTURE) ×6 IMPLANT
SUT VIC AB 1 CT1 27 (SUTURE) ×6
SUT VIC AB 1 CT1 27XBRD ANTBC (SUTURE) ×2 IMPLANT
SUT VIC AB 2-0 CT1 27 (SUTURE) ×3
SUT VIC AB 2-0 CT1 TAPERPNT 27 (SUTURE) ×1 IMPLANT
SYR CONTROL 10ML LL (SYRINGE) ×3 IMPLANT
TOWEL OR 17X26 10 PK STRL BLUE (TOWEL DISPOSABLE) ×6 IMPLANT
YANKAUER SUCT BULB TIP NO VENT (SUCTIONS) ×3 IMPLANT

## 2018-04-23 NOTE — Anesthesia Preprocedure Evaluation (Signed)
Anesthesia Evaluation  Patient identified by MRN, date of birth, ID band Patient awake    Reviewed: Allergy & Precautions, NPO status , Patient's Chart, lab work & pertinent test results  History of Anesthesia Complications (+) MALIGNANT HYPERTHERMIA  Airway Mallampati: II  TM Distance: >3 FB Neck ROM: Full    Dental no notable dental hx.    Pulmonary neg pulmonary ROS,    Pulmonary exam normal breath sounds clear to auscultation       Cardiovascular negative cardio ROS Normal cardiovascular exam Rhythm:Regular Rate:Normal     Neuro/Psych negative neurological ROS  negative psych ROS   GI/Hepatic negative GI ROS, Neg liver ROS, GERD  ,  Endo/Other  negative endocrine ROSMorbid obesity  Renal/GU negative Renal ROS  negative genitourinary   Musculoskeletal negative musculoskeletal ROS (+) Arthritis , Osteoarthritis,    Abdominal (+) + obese,   Peds negative pediatric ROS (+)  Hematology negative hematology ROS (+)   Anesthesia Other Findings Spina Bifida  Reproductive/Obstetrics negative OB ROS                             Anesthesia Physical Anesthesia Plan  ASA: III  Anesthesia Plan: General   Post-op Pain Management:  Regional for Post-op pain   Induction: Intravenous  PONV Risk Score and Plan: 3 and Ondansetron, Dexamethasone and Midazolam  Airway Management Planned: LMA  Additional Equipment:   Intra-op Plan:   Post-operative Plan: Extubation in OR  Informed Consent: I have reviewed the patients History and Physical, chart, labs and discussed the procedure including the risks, benefits and alternatives for the proposed anesthesia with the patient or authorized representative who has indicated his/her understanding and acceptance.   Dental advisory given  Plan Discussed with: CRNA  Anesthesia Plan Comments:         Anesthesia Quick Evaluation

## 2018-04-23 NOTE — H&P (Signed)
ORTHOPAEDIC CONSULTATION  REQUESTING PHYSICIAN: Yolonda Kida, MD  PCP:  Patient, No Pcp Per  Chief Complaint: Right ankle fracture  HPI: [name] is a 47 y.o. female who complains of right ankle pain following a fall at work last week.  She was evaluated in the emergency department and found to have an unstable right ankle fracture.  She presented to my office last week where she was evaluated and recommendation was made for operative fixation.  She presents today for that surgery.  He has no new complaints at this time.     Past Medical History:  Diagnosis Date  . Chiari malformation type II (HCC)   . GERD (gastroesophageal reflux disease)   . Hydrocephalus   . Malignant hyperthermia   . Osteoarthritis   . Spina bifida [facility])    Past Surgical History:  Procedure Laterality Date  . ANKLE ARTHROSCOPY Left    related to Tendon  . BACK SURGERY  1971   Spinal Bifida  . KNEE ARTHROSCOPY Left 1983  . SHUNT EXTERNALIZATION     Head x 2   Social History   Socioeconomic History  . Marital status: Married    Spouse name: Not on file  . Number of children: Not on file  . Years of education: Not on file  . Highest education level: Not on file  Occupational History  . Not on file  Social Needs  . Financial resource strain: Not on file  . Food insecurity:    Worry: Not on file    Inability: Not on file  . Transportation needs:    Medical: Not on file    Non-medical: Not on file  Tobacco Use  . Smoking status: Never Smoker  . Smokeless tobacco: Never Used  Substance and Sexual Activity  . Alcohol use: Never    Frequency: Never  . Drug use: Never  . Sexual activity: Not on file  Lifestyle  . Physical activity:    Days per week: Not on file    Minutes per session: Not on file  . Stress: Not on file  Relationships  . Social connections:    Talks on phone: Not on file    Gets together: Not on file    Attends religious service: Not on file   Active member of club or organization: Not on file    Attends meetings of clubs or organizations: Not on file    Relationship status: Not on file  Other Topics Concern  . Not on file  Social History Narrative  . Not on file   History reviewed. No pertinent family history. Allergies  Allergen Reactions  . Anesthetics, Amide Other (See Comments)    Pt has Malignant Hyperthermia which is an allergy to general anesthesia  . Gabapentin Other (See Comments)    Depression.  Marland Kitchen Percocet [Oxycodone-Acetaminophen] Itching  . Latex Rash  . Other Rash    Latex Adhesive tape: prefers to use paper tape   Prior to Admission medications   Medication Sig Start Date End Date Taking? Authorizing Provider  DULoxetine (CYMBALTA) 30 MG capsule Take 30 mg by mouth daily.  04/06/18 05/06/18 Yes [provider]  HYDROcodone-acetaminophen (NORCO/VICODIN) 5-325 MG tablet Take 1-2 tablets by mouth every 6 (six) hours as needed for severe pain. Patient taking differently: Take 1 tablet by mouth every 6 (six) hours as needed for severe pain.  04/12/18  Yes Plunkett, Alphonzo Lemmings, MD  ibuprofen (ADVIL,MOTRIN) 200 MG tablet Take 400-600 mg by mouth  every 8 (eight) hours as needed for moderate pain.    Yes [provider]   No results found.  Positive ROS: All other systems have been reviewed and were otherwise negative with the exception of those mentioned in the HPI and as above.  Physical Exam: General: Alert, no acute distress Cardiovascular: No pedal edema Respiratory: No cyanosis, no use of accessory musculature GI: No organomegaly, abdomen is soft and non-tender Skin: No lesions in the area of chief complaint Neurologic: Sensation intact distally Psychiatric: Patient is competent for consent with normal mood and affect Lymphatic: No axillary or cervical lymphadenopathy    Assessment: Right ankle trimalleolar fracture, closed.  Plan: -Plan for operative fixation with internal stabilization  today.  We again reviewed the risks and benefits of this procedure as well as the postoperative course.  She is provided informed consent. -She will be admitted postoperatively to the inpatient service due to some concomitant medical issues that will make it difficult for her to mobilize postoperatively.  She will likely need a skilled nursing facility postop.    Yolonda Kida, MD Cell [phone]    04/23/2018 7:10 AM

## 2018-04-23 NOTE — Brief Op Note (Signed)
04/23/2018  9:55 AM  PATIENT:  [name]  47 y.o. female  PRE-OPERATIVE DIAGNOSIS:  Right Trimalleolar ankle fracture  POST-OPERATIVE DIAGNOSIS:  Right Trimalleolar ankle fracture  PROCEDURE:  Procedure(s) with comments: OPEN REDUCTION INTERNAL FIXATION (ORIF) TRIMALLEOLAR ANKLE FRACTURE (Right) - 120 mins  SURGEON:  Surgeon(s) and Role:    * Aundria Rud, Noah Delaine, MD - Primary  PHYSICIAN ASSISTANT:   ASSISTANTS: none   ANESTHESIA:   regional and general  EBL:  50 mL   BLOOD ADMINISTERED:none  DRAINS: none   LOCAL MEDICATIONS USED:  NONE  SPECIMEN:  No Specimen  DISPOSITION OF SPECIMEN:  N/A  COUNTS:  YES  TOURNIQUET:   Total Tourniquet Time Documented: Thigh (Right) - 96 minutes Total: Thigh (Right) - 96 minutes   DICTATION: .Note written in EPIC  PLAN OF CARE: Admit to inpatient   PATIENT DISPOSITION:  PACU - hemodynamically stable.   Delay start of Pharmacological VTE agent (>24hrs) due to surgical blood loss or risk of bleeding:n/a

## 2018-04-23 NOTE — Discharge Instructions (Signed)
orthopedic discharge instructions:  -Nonweightbearing to right lower extremity. -Elevate right lower extremity with "toes above nose." -For mild to moderate pain use Tylenol and/or Advil as needed.  For breakthrough pain use your norco as directed. -For prevention of blood clots use the Lovenox injection once daily for 6 weeks. -Return to see Dr. Aundria Rud in 2 weeks for wound check and suture removal.

## 2018-04-23 NOTE — Anesthesia Postprocedure Evaluation (Signed)
Anesthesia Post Note  Patient: [name]  Procedure(s) Performed: OPEN REDUCTION INTERNAL FIXATION (ORIF) TRIMALLEOLAR ANKLE FRACTURE (Right )     Patient location during evaluation: PACU Anesthesia Type: General Level of consciousness: awake and alert Pain management: pain level controlled Vital Signs Assessment: post-procedure vital signs reviewed and stable Respiratory status: spontaneous breathing, nonlabored ventilation and respiratory function stable Cardiovascular status: blood pressure returned to baseline and stable Postop Assessment: no apparent nausea or vomiting Anesthetic complications: no    Last Vitals:  Vitals:   04/23/18 1056 04/23/18 1113  BP: 128/73 134/89  Pulse: 77 68  Resp: 17 16  Temp: (!) 36.3 C 36.5 C  SpO2: 100% 100%    Last Pain:  Vitals:   04/23/18 1113  TempSrc: Oral  PainSc: 0-No pain                 Lowella Curb

## 2018-04-23 NOTE — Op Note (Signed)
Date of Surgery: 04/23/2018  INDICATIONS: [name] is a 47 y.o.-year-old female who sustained a right ankle fracture; she was indicated for open reduction and internal fixation due to the displaced nature of the articular fracture and came to the operating room today for this procedure. The patient did consent to the procedure after discussion of the risks and benefits.  This is a Teacher, adult education. Related injury.  PREOPERATIVE DIAGNOSIS: right Closed trimalleolar ankle fracture  POSTOPERATIVE DIAGNOSIS: Same.  PROCEDURE:  1. Open treatment of right ankle fracture with internal fixation. Trimalleolar w/o fixation of posterior malleolus 2. Open reduction and internal fixation of distal tibiofibular joint.  (Syndesmotic fixation). 3.  Intraoperative fluoroscopy, 3 views right ankle, interpreted by myself.   SURGEON: Leovardo Thoman P. Aundria Rud, M.D.  ASSIST: None.  ANESTHESIA:  General With regional  TOURNIQUET TIME: 90 minutes at 350 mmHg  IV FLUIDS AND URINE: See anesthesia.  ESTIMATED BLOOD LOSS: 30 mL.  IMPLANTS: [employer] Stainless steel One third tubular locking plate with 3.5 mm cortical nonlocking screws and 4.0 cancellus screws  2 cannulated 30 mm length screws medial  2 tightrope syndesmotic fixation devices, stainless steel.  COMPLICATIONS: None.  DESCRIPTION OF PROCEDURE: The patient was brought to the operating room and placed supine on the operating table.  The patient had been signed prior to the procedure and this was documented. The patient had the anesthesia placed by the anesthesiologist.  A nonsterile tourniquet was placed on the upper thigh.  The prep verification and incision time-outs were performed to confirm that this was the correct patient, site, side and location. The patient had an SCD on the opposite lower extremity. The patient did receive antibiotics prior to the incision and was re-dosed during the procedure as needed at indicated intervals.  The patient had the  lower extremity prepped and draped in the standard surgical fashion.  The extremity was exsanguinated using an esmarch bandage and the tourniquet was inflated to 350 mm Hg.   Incision was made over the distal fibula and the fracture was exposed and reduced anatomically with a clamp.  There was noted to be marked comminution at the oblique distal fibula fracture.  This was not able to be lagged.  The decision was made to bridge the comminution with a 10 hole plate.. I then applied a 1/3 tubular locking plate and secured it proximally and distally with non-locking screws. Bone quality was Fair. I used c-arm to confirm satisfactory reduction and fixation.   I then turned my attention to the medial malleolus. Incision was made over the medial malleolus and the fracture exposed and held provisionally with a clamp. 2 guidepins were placed for the 4.0 mm cannulated screws and then confirmation of reduction was made with fluoroscopy. I then placed 2  30mm screws which had satisfactory fixation.   The syndesmosis was stressed using live fluoroscopy and found to be Widened and unstable to stress radiography.  At this point the decision was made to fix the syndesmosis.  Using a King tong clamp the ankle was floated off of the bed using a towel bump.  The ankle itself was held in neutral dorsiflexion.  A King tong clamp was utilized to reduce the syndesmosis anatomically.  Next we drilled from the lateral plate across through to the medial cortex of the tibia and through that cortex for a total of 4 cortices.  The tightrope fixation device was next utilized per the manufacturer's recommendations to cinch down the distal tibiofibular joint.  This  was duplicated for a second tight rope as well.   Next we obtained AP, lateral, mortise, and stress radiography to assess for the adequacy of reduction as well as the placement of orthopedic hardware.  The wounds were irrigated, and closed with vicryl with routine closure  for the skin. The wounds were injected with local anesthetic. Sterile gauze was applied followed by a posterior splint. She was awakened and returned to the PACU in stable and satisfactory condition. There were no complications.    The patient lives alone and will likely require an inpatient hospitalization for placement.  POSTOPERATIVE PLAN: [name] will remain nonweightbearing on this leg for approximately 8 weeks; [name] will return for suture removal in 2 weeks.  He will be immobilized in a short leg splint and then transitioned to a CAM walker at his first follow up appointment.  [name] will receive DVT prophylaxis based on other medications, activity level, and risk ratio of bleeding to thrombosis.  Due to her upcoming decreased mobility and her large body habitus we are going to place her on 40 mg of Lovenox once daily for 6 weeks for DVT prophylaxis.  Maryan Rued, MD [facility] [phone] 8:41 PM

## 2018-04-23 NOTE — Plan of Care (Signed)
Reviewed plan of care with pt and family, specifically pain control, IS use, safety precautions, and importance of notifying RN with any questions or concerns. Pt attentive and verbalized understanding of all education.

## 2018-04-23 NOTE — Anesthesia Procedure Notes (Signed)
Anesthesia Regional Block: Popliteal block   Pre-Anesthetic Checklist: ,, timeout performed, Correct Patient, Correct Site, Correct Laterality, Correct Procedure, Correct Position, site marked, Risks and benefits discussed,  Surgical consent,  Pre-op evaluation,  At surgeon's request and post-op pain management  Laterality: Right  Prep: chloraprep       Needles:  Injection technique: Single-shot  Needle Type: Stimiplex     Needle Length: 9cm  Needle Gauge: 21     Additional Needles:   Procedures:,,,, ultrasound used (permanent image in chart),,,,  Narrative:  Start time: 04/23/2018 7:36 AM End time: 04/23/2018 7:41 AM Injection made incrementally with aspirations every 5 mL.  Performed by: Personally  Anesthesiologist: Lowella Curb, MD       20 ml 0.5% Ropivacaine added to right adductor canal space

## 2018-04-23 NOTE — Transfer of Care (Signed)
Immediate Anesthesia Transfer of Care Note  Patient: [name]  Procedure(s) Performed: OPEN REDUCTION INTERNAL FIXATION (ORIF) TRIMALLEOLAR ANKLE FRACTURE (Right )  Patient Location: PACU  Anesthesia Type:General  Level of Consciousness: sedated, patient cooperative and responds to stimulation  Airway & Oxygen Therapy: Patient Spontanous Breathing and Patient connected to face mask oxygen  Post-op Assessment: Report given to RN and Post -op Vital signs reviewed and stable  Post vital signs: Reviewed and stable  Last Vitals:  Vitals Value Taken Time  BP 130/85 04/23/2018 10:00 AM  Temp    Pulse 76 04/23/2018 10:03 AM  Resp 17 04/23/2018 10:03 AM  SpO2 100 % 04/23/2018 10:03 AM  Vitals shown include unvalidated device data.  Last Pain:  Vitals:   04/23/18 0632  TempSrc:   PainSc: 1       Patients Stated Pain Goal: 1 (04/23/18 1610)  Complications: No apparent anesthesia complications

## 2018-04-23 NOTE — Anesthesia Procedure Notes (Signed)
Procedure Name: LMA Insertion Performed by: Jazminn Pomales J, CRNA Pre-anesthesia Checklist: Patient identified, Emergency Drugs available, Suction available, Patient being monitored and Timeout performed Patient Re-evaluated:Patient Re-evaluated prior to induction Oxygen Delivery Method: Circle system utilized Preoxygenation: Pre-oxygenation with 100% oxygen Induction Type: IV induction Ventilation: Mask ventilation without difficulty LMA: LMA inserted LMA Size: 4.0 Number of attempts: 1 Placement Confirmation: positive ETCO2,  CO2 detector and breath sounds checked- equal and bilateral Tube secured with: Tape Dental Injury: Teeth and Oropharynx as per pre-operative assessment        

## 2018-04-24 LAB — HIV ANTIBODY (ROUTINE TESTING W REFLEX): HIV SCREEN 4TH GENERATION: NONREACTIVE

## 2018-04-24 NOTE — Evaluation (Signed)
Physical Therapy Evaluation Patient Details Name: [name] MRN: [id number] DOB: [birth year] Today's Date: 04/24/2018   History of Present Illness  47yo female who fell at work adn has since had R ankle pain; she was found to have unstable R ankle fracture in the ED. She recieved R ankle ORIF for trimalleolar fracture on 04/23/18. PMH chiari malformation, hydrocephalus, spina bifida, hx L ankle arthroscopy, hx back surgery secondary to spina bifida, hx knee arthroscopy, head shunt externalization   Clinical Impression   Patient received in bed, very pleasant and willing to participate in PT today; of note, she reports a history of spina bifida affecting her gait and balance at baseline and has had difficulty with mobility since she sustained this fracture. She is able to perform functional bed mobility with MinA, as well as functional transfers with Min guard-MinA +2 for safety and balance assist. Did not attempt gait today due to concerns related to balance, however she was able to take pivotal shuffle steps to chair while maintaining NWB R LE. She was left up in the chair with all needs met this morning, CNA educated regarding patient mobility status. She will continue to benefit from skilled PT services in the acute setting, and will also benefit from aggressive rehabilitation in the CIR setting to assist in return to optimal level of function moving forward.     Follow Up Recommendations CIR;Supervision/Assistance - 24 hour    Equipment Recommendations  Other (comment)(defer to next venue )    Recommendations for Other Services       Precautions / Restrictions Precautions Precautions: Fall;Other (comment) Precaution Comments: NWB R ankle  Restrictions Weight Bearing Restrictions: Yes RLE Weight Bearing: Non weight bearing      Mobility  Bed Mobility Overal bed mobility: Needs Assistance Bed Mobility: Supine to Sit     Supine to sit: Min assist     General bed mobility  comments: MinA to manage R LE   Transfers Overall transfer level: Needs assistance Equipment used: Rolling walker (2 wheeled) Transfers: Sit to/from [facility] Sit to Stand: Min assist;+2 safety/equipment Stand pivot transfers: Min guard;+2 safety/equipment       General transfer comment: Min guard-MinA +2 for safety and balance assist, cues and feedback for technique and to maintain NWB status   Ambulation/Gait             General Gait Details: DNT today, patient reports she has difficutly just transferring at baseline and has not been able to ambualte since her fracture   Stairs            Wheelchair Mobility    Modified Rankin (Stroke Patients Only)       Balance Overall balance assessment: Needs assistance;History of Falls Sitting-balance support: Bilateral upper extremity supported;Feet supported Sitting balance-Leahy Scale: Good     Standing balance support: Bilateral upper extremity supported;During functional activity Standing balance-Leahy Scale: Fair Standing balance comment: Min guard-MinA +2 for safety                              Pertinent Vitals/Pain Pain Assessment: Faces Faces Pain Scale: Hurts a little bit Pain Location: R ankle  Pain Descriptors / Indicators: Aching;Sore Pain Intervention(s): Limited activity within patient's tolerance;Monitored during session;Repositioned    Home Living Family/patient expects to be discharged to:: Private residence Living Arrangements: Spouse/significant other Available Help at Discharge: Family;Available 24 hours/day Type of Home: House Home Access: Ramped entrance  Home Layout: One level Home Equipment: Walker - 2 wheels;Electric scooter      Prior Function Level of Independence: Independent with assistive device(s)         Comments: works, usually independent in self care, able to [employer] hold distances with walker but does require electric scooter for  longer distances      Hand Dominance        Extremity/Trunk Assessment   Upper Extremity Assessment Upper Extremity Assessment: Defer to OT evaluation    Lower Extremity Assessment Lower Extremity Assessment: Generalized weakness    Cervical / Trunk Assessment Cervical / Trunk Assessment: Kyphotic  Communication   Communication: No difficulties  Cognition Arousal/Alertness: Awake/alert Behavior During Therapy: WFL for tasks assessed/performed Overall Cognitive Status: Within Functional Limits for tasks assessed                                        General Comments      Exercises     Assessment/Plan    PT Assessment Patient needs continued PT services  PT Problem List Decreased strength;Decreased mobility;Decreased coordination;Obesity;Decreased activity tolerance;Decreased balance;Impaired sensation       PT Treatment Interventions DME instruction;Therapeutic activities;Gait training;Therapeutic exercise;Patient/family education;Stair training;Balance training;Functional mobility training;Neuromuscular re-education;Manual techniques    PT Goals (Current goals can be found in the Care Plan section)  Acute Rehab PT Goals Patient Stated Goal: to get well, get back to baseline  PT Goal Formulation: With patient/family Time For Goal Achievement: 05/08/18 Potential to Achieve Goals: Good    Frequency Min 4X/week   Barriers to discharge        Co-evaluation               AM-PAC PT "6 Clicks" Daily Activity  Outcome Measure Difficulty turning over in bed (including adjusting bedclothes, sheets and blankets)?: None Difficulty moving from lying on back to sitting on the side of the bed? : A Little Difficulty sitting down on and standing up from a chair with arms (e.g., wheelchair, bedside commode, etc,.)?: A Little Help needed moving to and from a bed to chair (including a wheelchair)?: A Little Help needed walking in hospital room?: A  Lot Help needed climbing 3-5 steps with a railing? : A Lot 6 Click Score: 17    End of Session Equipment Utilized During Treatment: Gait belt Activity Tolerance: Patient tolerated treatment well Patient left: in chair;with family/visitor present;with call bell/phone within reach Nurse Communication: Mobility status;Weight bearing status;Precautions PT Visit Diagnosis: Unsteadiness on feet (R26.81);History of falling (Z91.81);Difficulty in walking, not elsewhere classified (R26.2);Muscle weakness (generalized) (M62.81);Other symptoms and signs involving the nervous system (R29.898)    Time: 6812-7517 PT Time Calculation (min) (ACUTE ONLY): 23 min   Charges:   PT Evaluation $PT Eval Moderate Complexity: 1 Mod PT Treatments $Therapeutic Activity: 8-22 mins   PT G Codes:        Deniece Ree PT, DPT, CBIS  Supplemental Physical Therapist [place]   Pager [phone]

## 2018-04-24 NOTE — Progress Notes (Signed)
Subjective: 1 Day Post-Op Procedure(s) (LRB): OPEN REDUCTION INTERNAL FIXATION (ORIF) TRIMALLEOLAR ANKLE FRACTURE (Right) Patient reports pain as mild.  Reports a good night. Denies tingling. Denies SOB or CP.   Objective: Vital signs in last 24 hours: Temp:  [97.4 F (36.3 C)-98.4 F (36.9 C)] 98 F (36.7 C) (05/25 0544) Pulse Rate:  [65-85] 67 (05/25 0544) Resp:  [13-24] 17 (05/25 0544) BP: (115-136)/(59-89) 136/67 (05/25 0544) SpO2:  [97 %-100 %] 100 % (05/25 0544)  Intake/Output from previous day: 05/24 0701 - 05/25 0700 In: 2282 [P.O.:660; I.V.:1567; IV Piggyback:55] Out: 650 [Urine:600; Blood:50] Intake/Output this shift: No intake/output data recorded.  Recent Labs    04/21/18 1210 04/23/18 1150  HGB 14.3 13.7   Recent Labs    04/21/18 1210 04/23/18 1150  WBC 8.8 11.4*  RBC 4.76 4.50  HCT 43.5 41.4  PLT 351 328   Recent Labs    04/21/18 1210 04/23/18 1150  NA 139  --   K 4.8  --   CL 104  --   CO2 23  --   BUN 22*  --   CREATININE 0.88 0.85  GLUCOSE 99  --   CALCIUM 9.6  --    No results for input(s): LABPT, INR in the last 72 hours.  Well nourished. Alert and oriented x3. RRR, Lungs clear, BS x4. Abdomen soft and non tender. Right Calf soft and non tender. Right lower leg dressing C/D/I. No DVT signs. Compartment soft. No signs of infection.  Right LE grossly neurovascular intact.  Anticipated LOS equal to or greater than 2 midnights due to - Age 48 and older with one or more of the following:  - Obesity  - Expected need for hospital services (PT, OT, Nursing) required for safe  discharge  - Anticipated need for postoperative skilled nursing care or inpatient rehab  - Active co-morbidities: None OR   - Unanticipated findings during/Post Surgery: None  - Patient is a high risk of re-admission due to: None   Assessment/Plan: 1 Day Post-Op Procedure(s) (LRB): OPEN REDUCTION INTERNAL FIXATION (ORIF) TRIMALLEOLAR ANKLE FRACTURE (Right) D/C IV  fluids  Pain management NWB RLE Plan to D/c when ready  Morbid Obesity: Diet and Weight loss exercise     [name], [name] 04/24/2018, 8:28 AM

## 2018-04-25 NOTE — Progress Notes (Signed)
Physical Therapy Treatment Patient Details Name: [name] MRN: [id number] DOB: 1971-04-12 Today's Date: 04/25/2018    History of Present Illness 47yo female who fell at work adn has since had R ankle pain; she was found to have unstable R ankle fracture in the ED. She recieved R ankle ORIF for trimalleolar fracture on 04/23/18. PMH chiari malformation, hydrocephalus, spina bifida, hx L ankle arthroscopy, hx back surgery secondary to spina bifida, hx knee arthroscopy, head shunt externalization     PT Comments    Assisted OOB to [facility] to void then to recliner to perform some R LE TE's followed by ICE.   Follow Up Recommendations  CIR;Supervision/Assistance - 24 hour     Equipment Recommendations       Recommendations for Other Services       Precautions / Restrictions Precautions Precautions: Fall Restrictions Weight Bearing Restrictions: Yes RLE Weight Bearing: Non weight bearing    Mobility  Bed Mobility Overal bed mobility: Needs Assistance Bed Mobility: Supine to Sit     Supine to sit: Min assist     General bed mobility comments: MinA to manage R LE and increased time  Transfers Overall transfer level: Needs assistance Equipment used: None Transfers: Stand Pivot Transfers Sit to Stand: Min assist;+2 safety/equipment Stand pivot transfers: Min assist       General transfer comment: instructed and demonstarted how to transfer 1/4 pivot without need for a walker.  Pt performed from elevated bed to [facility] then again from [facility] to recliner.    Ambulation/Gait             General Gait Details: transfers only this session   Stairs             Wheelchair Mobility    Modified Rankin (Stroke Patients Only)       Balance                                            Cognition Arousal/Alertness: Awake/alert Behavior During Therapy: WFL for tasks assessed/performed Overall Cognitive Status: Within Functional Limits for tasks  assessed                                        Exercises      General Comments        Pertinent Vitals/Pain Pain Assessment: 0-10 Pain Score: 5  Pain Location: R ankle  Pain Descriptors / Indicators: Aching;Sore Pain Intervention(s): Monitored during session;Repositioned;Ice applied    Home Living                      Prior Function            PT Goals (current goals can now be found in the care plan section) Progress towards PT goals: Progressing toward goals    Frequency    Min 4X/week      PT Plan Current plan remains appropriate    Co-evaluation              AM-PAC PT "6 Clicks" Daily Activity  Outcome Measure  Difficulty turning over in bed (including adjusting bedclothes, sheets and blankets)?: A Little Difficulty moving from lying on back to sitting on the side of the bed? : A Little Difficulty sitting down on and standing up  from a chair with arms (e.g., wheelchair, bedside commode, etc,.)?: A Little Help needed moving to and from a bed to chair (including a wheelchair)?: A Little Help needed walking in hospital room?: A Little Help needed climbing 3-5 steps with a railing? : Total 6 Click Score: 16    End of Session Equipment Utilized During Treatment: Gait belt Activity Tolerance: Patient tolerated treatment well Patient left: in chair;with family/visitor present;with call bell/phone within reach Nurse Communication: Mobility status;Weight bearing status;Precautions PT Visit Diagnosis: Unsteadiness on feet (R26.81);History of falling (Z91.81);Difficulty in walking, not elsewhere classified (R26.2);Muscle weakness (generalized) (M62.81);Other symptoms and signs involving the nervous system (R29.898)     Time: 8657-8469 PT Time Calculation (min) (ACUTE ONLY): 30 min  Charges:  $Therapeutic Exercise: 8-22 mins $Therapeutic Activity: 8-22 mins                    G Codes:      Felecia Shelling  PTA [facility]  Acute   Rehab Pager      [phone]

## 2018-04-25 NOTE — Progress Notes (Signed)
     Subjective: 2 Days Post-Op Procedure(s) (LRB): OPEN REDUCTION INTERNAL FIXATION (ORIF) TRIMALLEOLAR ANKLE FRACTURE (Right)   Patient reports pain as moderate, controlled with meds.  No reported events throughout the night.  States that she has had a little drainage through the splint.     Objective:   VITALS:   Vitals:   04/24/18 2136 04/25/18 0531  BP: 101/62 111/70  Pulse: 84 77  Resp: 16 18  Temp: 99.6 F (37.6 C) 98 F (36.7 C)  SpO2: 95% 97%    Incision: mild to moderate drainage No cellulitis present Compartment soft  LABS Recent Labs    04/23/18 1150  HGB 13.7  HCT 41.4  WBC 11.4*  PLT 328    Recent Labs    04/23/18 1150  CREATININE 0.85     Assessment/Plan: 2 Days Post-Op Procedure(s) (LRB): OPEN REDUCTION INTERNAL FIXATION (ORIF) TRIMALLEOLAR ANKLE FRACTURE (Right)    Up with therapy NWB right leg CIR consult placed as she is not doing well with PT   Anastasio Auerbach. Gardenia Witter   PAC  04/25/2018, 8:44 AM

## 2018-04-25 NOTE — Progress Notes (Signed)
OT Cancellation Note  Patient Details Name: [name] MRN: [id number] DOB: [birth year]   Cancelled Treatment:    Reason Eval/Treat Not Completed: Other (comment)  Pt sitting EOB with CNA and felt as if she was going to pass out. OTed Aed pt back to supine position. BP 111/70 Pt felt better after transitioning to supine. OT will check on pt later in day or next day Lise Auer, [place] [phone]  Einar Crow D 04/25/2018, 10:58 AM

## 2018-04-25 NOTE — Progress Notes (Signed)
Physical Therapy Treatment Patient Details Name: [name] MRN: [id number] DOB: 07-24-1971 Today's Date: 04/25/2018    History of Present Illness 46yo female who fell at work adn has since had R ankle pain; she was found to have unstable R ankle fracture in the ED. She recieved R ankle ORIF for trimalleolar fracture on 04/23/18. PMH chiari malformation, hydrocephalus, spina bifida, hx L ankle arthroscopy, hx back surgery secondary to spina bifida, hx knee arthroscopy, head shunt externalization     PT Comments    POD # 2 Spouse present during session.  Assisted pt from recliner back to bed General transfer comment: assisted from recliner back to bed no walker, just stand pivot 1/4 turn towrds her LEFT  Follow Up Recommendations  CIR;Supervision/Assistance - 24 hour     Equipment Recommendations       Recommendations for Other Services       Precautions / Restrictions Precautions Precautions: Fall Precaution Comments: NWB R ankle  Restrictions Weight Bearing Restrictions: Yes RLE Weight Bearing: Non weight bearing    Mobility  Bed Mobility Overal bed mobility: Needs Assistance Bed Mobility: Sit to Supine       Sit to supine: Min assist;Mod assist   General bed mobility comments: assisted back to bed  Transfers Overall transfer level: Needs assistance Equipment used: None Transfers: Stand Pivot Transfers Sit to Stand: Min assist;+2 safety/equipment         General transfer comment: assisted from recliner back to bed no walker, just stand pivot 1/4 turn towrds her LEFT  Ambulation/Gait                 Stairs             Wheelchair Mobility    Modified Rankin (Stroke Patients Only)       Balance                                            Cognition Arousal/Alertness: Awake/alert Behavior During Therapy: WFL for tasks assessed/performed Overall Cognitive Status: Within Functional Limits for tasks assessed                                  General Comments: very pleasant      Exercises      General Comments        Pertinent Vitals/Pain Pain Assessment: 0-10 Pain Score: 3  Pain Location: R ankle  Pain Descriptors / Indicators: Aching;Sore Pain Intervention(s): Monitored during session;Repositioned    Home Living                      Prior Function            PT Goals (current goals can now be found in the care plan section) Progress towards PT goals: Progressing toward goals    Frequency    Min 4X/week      PT Plan Current plan remains appropriate    Co-evaluation              AM-PAC PT "6 Clicks" Daily Activity  Outcome Measure  Difficulty turning over in bed (including adjusting bedclothes, sheets and blankets)?: A Little Difficulty moving from lying on back to sitting on the side of the bed? : A Little Difficulty sitting down on and standing up from a  chair with arms (e.g., wheelchair, bedside commode, etc,.)?: A Little Help needed moving to and from a bed to chair (including a wheelchair)?: A Little Help needed walking in hospital room?: A Little Help needed climbing 3-5 steps with a railing? : Total 6 Click Score: 16    End of Session Equipment Utilized During Treatment: Gait belt Activity Tolerance: Patient tolerated treatment well Patient left: in bed;with call bell/phone within reach;with family/visitor present Nurse Communication: Mobility status;Weight bearing status;Precautions PT Visit Diagnosis: Unsteadiness on feet (R26.81);History of falling (Z91.81);Difficulty in walking, not elsewhere classified (R26.2);Muscle weakness (generalized) (M62.81);Other symptoms and signs involving the nervous system (R29.898)     Time: 8119-1478 PT Time Calculation (min) (ACUTE ONLY): 17 min  Charges:  $Therapeutic Activity: 8-22 mins                    G Codes:       Felecia Shelling  PTA [facility]  Acute  Rehab Pager      [phone]

## 2018-04-26 NOTE — Progress Notes (Signed)
Orthopedics Progress Note  Subjective: Called to see the patient due to a foul smell coming from the right leg short leg splint and persistent drainage from the bandage.   Objective:  Vitals:   04/25/18 2224 04/26/18 0603  BP: 130/71 135/72  Pulse: 69 68  Resp: 16 16  Temp: 98.9 F (37.2 C) 98.1 F (36.7 C)  SpO2: 98% 97%    General: Awake and alert  Musculoskeletal: Splint taken down and incisions checked. Minimal bleeding from the incisions, no erythema and no evidence for infection. Dressings completely replaced with 4x4s and 4 inch Kerlex, extra padding for heel. Splint reapplied Neurovascularly intact  Lab Results  Component Value Date   WBC 11.4 (H) 04/23/2018   HGB 13.7 04/23/2018   HCT 41.4 04/23/2018   MCV 92.0 04/23/2018   PLT 328 04/23/2018       Component Value Date/Time   NA 139 04/21/2018 1210   K 4.8 04/21/2018 1210   CL 104 04/21/2018 1210   CO2 23 04/21/2018 1210   GLUCOSE 99 04/21/2018 1210   BUN 22 (H) 04/21/2018 1210   CREATININE 0.85 04/23/2018 1150   CALCIUM 9.6 04/21/2018 1210   GFRNONAA >60 04/23/2018 1150   GFRAA >60 04/23/2018 1150    No results found for: INR, PROTIME  Assessment/Plan: POD #3 s/p Procedure(s): OPEN REDUCTION INTERNAL FIXATION (ORIF) TRIMALLEOLAR ANKLE FRACTURE Splint changed. Continue NWB on the right  Almedia Balls. Ranell Patrick, MD 04/26/2018 11:48 AM

## 2018-04-26 NOTE — Progress Notes (Signed)
   Subjective: 3 Days Post-Op Procedure(s) (LRB): OPEN REDUCTION INTERNAL FIXATION (ORIF) TRIMALLEOLAR ANKLE FRACTURE (Right)  Pt doing well this morning Minimal to no pain as long as she does not put try and lift the right leg Denies any new symptoms or issues Patient reports pain as mild.  Objective:   VITALS:   Vitals:   04/25/18 2224 04/26/18 0603  BP: 130/71 135/72  Pulse: 69 68  Resp: 16 16  Temp: 98.9 F (37.2 C) 98.1 F (36.7 C)  SpO2: 98% 97%    Right lower extremity - currently in splint  Slight decreased sensation to dorsal foot but nvt distal toes No rashes Mild edema as compared to left foot  LABS Recent Labs    04/23/18 1150  HGB 13.7  HCT 41.4  WBC 11.4*  PLT 328    Recent Labs    04/23/18 1150  CREATININE 0.85     Assessment/Plan: 3 Days Post-Op Procedure(s) (LRB): OPEN REDUCTION INTERNAL FIXATION (ORIF) TRIMALLEOLAR ANKLE FRACTURE (Right) Continue PT/OT with strict non weight bearing CIR vs rehab placement Pain management as needed    Elizebeth Koller, MPAS [facility] is now [facility] [employer] [facility] [address], [address], [place], [place] [address] Phone: [phone] [email or web address] Facebook  [employer]

## 2018-04-26 NOTE — Progress Notes (Signed)
Called office to page MD or PA, in regards to patients heel or incision draining since transfer from PACU on Friday. Compression wrap removed by OT and showed a moderate amount of red and green draining on white splint with odor. Waiting on return call at this time.

## 2018-04-26 NOTE — Progress Notes (Signed)
Physical Therapy Treatment Patient Details Name: [name] MRN: [id number] DOB: 08/18/1971 Today's Date: 04/26/2018    History of Present Illness 46yo female who fell at work adn has since had R ankle pain; she was found to have unstable R ankle fracture in the ED. She recieved R ankle ORIF for trimalleolar fracture on 04/23/18. PMH chiari malformation, hydrocephalus, spina bifida, hx L ankle arthroscopy, hx back surgery secondary to spina bifida, hx knee arthroscopy, head shunt externalization     PT Comments    Practiced transfers without walker 1/4 pivot towards pts LEFT from recliner to [facility] then again from [facility] to bed.  Assisted back to bed and performed some R LE TE's followed by ICE and elevation.   Follow Up Recommendations  CIR;Supervision/Assistance - 24 hour     Equipment Recommendations  Other (comment)    Recommendations for Other Services       Precautions / Restrictions Precautions Precautions: Fall Precaution Comments: NWB R ankle  Restrictions Weight Bearing Restrictions: Yes RLE Weight Bearing: Non weight bearing    Mobility  Bed Mobility Overal bed mobility: Needs Assistance Bed Mobility: Sit to Supine       Sit to supine: Min assist;Mod assist   General bed mobility comments: assisted back to bed  Transfers Overall transfer level: Needs assistance Equipment used: None Transfers: Squat Pivot Transfers Sit to Stand: Mod assist Stand pivot transfers: Mod assist       General transfer comment: squat pivot no walker from recliner to [facility] then from [facility] to bed all towards her LEFT with good hand placement, transition all while NWB R LE.    Ambulation/Gait             General Gait Details: transfers only this session   Stairs             Wheelchair Mobility    Modified Rankin (Stroke Patients Only)       Balance                                            Cognition Arousal/Alertness:  Awake/alert Behavior During Therapy: WFL for tasks assessed/performed Overall Cognitive Status: Within Functional Limits for tasks assessed                                        Exercises  10 reps B LE knee presses  10 reps R LE HS  10 reps R LE ABD/ADd  10 reps R LE SLR AAROM (cast heavy)  10 reps R LE SAQ's     General Comments        Pertinent Vitals/Pain Pain Assessment: No/denies pain Pain Score: 2  Pain Location: R ankle "this new ACE cast is heavier" Pain Descriptors / Indicators: Discomfort;Constant Pain Intervention(s): Monitored during session;Repositioned;Ice applied    Home Living                      Prior Function            PT Goals (current goals can now be found in the care plan section) Progress towards PT goals: Progressing toward goals    Frequency    Min 4X/week      PT Plan Current plan remains appropriate    Co-evaluation  AM-PAC PT "6 Clicks" Daily Activity  Outcome Measure    Difficulty moving from lying on back to sitting on the side of the bed? : A Little Difficulty sitting down on and standing up from a chair with arms (e.g., wheelchair, bedside commode, etc,.)?: A Little Help needed moving to and from a bed to chair (including a wheelchair)?: A Little Help needed walking in hospital room?: A Little Help needed climbing 3-5 steps with a railing? : Total 6 Click Score: 13    End of Session Equipment Utilized During Treatment: Gait belt Activity Tolerance: Patient tolerated treatment well Patient left: in bed;with call bell/phone within reach;with family/visitor present Nurse Communication: Mobility status;Weight bearing status;Precautions PT Visit Diagnosis: Unsteadiness on feet (R26.81);History of falling (Z91.81);Difficulty in walking, not elsewhere classified (R26.2);Muscle weakness (generalized) (M62.81);Other symptoms and signs involving the nervous system (R29.898)     Time:  1610-9604 PT Time Calculation (min) (ACUTE ONLY): 25 min  Charges:  $Therapeutic Exercise: 8-22 mins $Therapeutic Activity: 8-22 mins                    G Codes:       Felecia Shelling  PTA [facility]  Acute  Rehab Pager      [phone]

## 2018-04-26 NOTE — Evaluation (Signed)
Occupational Therapy Evaluation Patient Details Name: [name] MRN: [id number] DOB: 1971-06-21 Today's Date: 04/26/2018    History of Present Illness 47yo female who fell at work adn has since had R ankle pain; she was found to have unstable R ankle fracture in the ED. She recieved R ankle ORIF for trimalleolar fracture on 04/23/18. PMH chiari malformation, hydrocephalus, spina bifida, hx L ankle arthroscopy, hx back surgery secondary to spina bifida, hx knee arthroscopy, head shunt externalization    Clinical Impression   Pt admitted with fall and ankle fx. Pt currently with functional limitations due to the deficits listed below (see OT Problem List).  Pt will benefit from skilled OT to increase their safety and independence with ADL and functional mobility for ADL to facilitate discharge to venue listed below.      Follow Up Recommendations  CIR    Equipment Recommendations  3 in 1 bedside commode       Precautions / Restrictions Precautions Precautions: Fall Precaution Comments: NWB R ankle  Restrictions Weight Bearing Restrictions: Yes RLE Weight Bearing: Non weight bearing      Mobility Bed Mobility Overal bed mobility: Needs Assistance Bed Mobility: Supine to Sit     Supine to sit: Min assist        Transfers Overall transfer level: Needs assistance Equipment used: None Transfers: Squat Pivot Transfers Sit to Stand: Mod assist   Squat pivot transfers: Mod assist     General transfer comment: assited to recliner. Squat pivot to her left    Balance Overall balance assessment: Needs assistance;History of Falls Sitting-balance support: Bilateral upper extremity supported;Feet supported Sitting balance-Leahy Scale: Good     Standing balance support: Bilateral upper extremity supported;During functional activity Standing balance-Leahy Scale: Fair Standing balance comment: Min guard-MinA +2 for safety                            ADL  either performed or assessed with clinical judgement   ADL Overall ADL's : Needs assistance/impaired Eating/Feeding: Set up;Sitting   Grooming: Set up;Sitting   Upper Body Bathing: Minimal assistance;Sitting   Lower Body Bathing: Maximal assistance;Sitting/lateral leans;Bed level   Upper Body Dressing : Set up;Sitting   Lower Body Dressing: Maximal assistance;Bed level;Sitting/lateral leans   Toilet Transfer: Squat-pivot;Moderate assistance Toilet Transfer Details (indicate cue type and reason): squat pivot to recliner            General ADL Comments: pts cast/ACE wrap with oder. RM notified. Pt VERY pleasant and participative     Vision Patient Visual Report: No change from baseline              Pertinent Vitals/Pain Pain Score: 3  Pain Location: R ankle  Pain Descriptors / Indicators: Discomfort Pain Intervention(s): Limited activity within patient's tolerance;Monitored during session     Hand Dominance     Extremity/Trunk Assessment Upper Extremity Assessment Upper Extremity Assessment: Generalized weakness           Communication Communication Communication: No difficulties   Cognition Arousal/Alertness: Awake/alert Behavior During Therapy: WFL for tasks assessed/performed Overall Cognitive Status: Within Functional Limits for tasks assessed                                 General Comments: very pleasant   General Comments       Exercises     Shoulder Instructions  Home Living Family/patient expects to be discharged to:: Private residence Living Arrangements: Spouse/significant other Available Help at Discharge: Family;Available 24 hours/day Type of Home: House Home Access: Ramped entrance     Home Layout: One level     Bathroom Shower/Tub: Chief Strategy Officer: Standard     Home Equipment: Environmental consultant - 2 wheels;Electric scooter          Prior Functioning/Environment Level of Independence:  Independent with assistive device(s)        Comments: works, usually independent in self care, able to [employer] hold distances with walker but does require electric scooter for longer distances         OT Problem List: Decreased strength;Impaired balance (sitting and/or standing);Decreased knowledge of precautions;Decreased knowledge of use of DME or AE;Pain;Obesity      OT Treatment/Interventions: Self-care/ADL training;Patient/family education;DME and/or AE instruction    OT Goals(Current goals can be found in the care plan section) Acute Rehab OT Goals Patient Stated Goal: to get well, get back to baseline  OT Goal Formulation: With patient Time For Goal Achievement: 05/10/18 Potential to Achieve Goals: Good  OT Frequency: Min 2X/week   Barriers to D/C: Decreased caregiver support             AM-PAC PT "6 Clicks" Daily Activity     Outcome Measure Help from another person eating meals?: A Little Help from another person taking care of personal grooming?: A Little Help from another person toileting, which includes using toliet, bedpan, or urinal?: Total Help from another person bathing (including washing, rinsing, drying)?: A Lot Help from another person to put on and taking off regular upper body clothing?: A Little Help from another person to put on and taking off regular lower body clothing?: Total 6 Click Score: 13   End of Session Nurse Communication: Mobility status  Activity Tolerance: Patient tolerated treatment well Patient left: in chair;with call bell/phone within reach  OT Visit Diagnosis: Unsteadiness on feet (R26.81);Other abnormalities of gait and mobility (R26.89);History of falling (Z91.81);Pain Pain - Right/Left: Right Pain - part of body: Ankle and joints of foot                Time: 9604-5409 OT Time Calculation (min): 25 min Charges:  OT General Charges $OT Visit: 1 Visit OT Evaluation $OT Eval Moderate Complexity: 1 Mod OT  Treatments $Self Care/Home Management : 8-22 mins G-Codes:     Lise Auer, OT [phone]  Einar Crow D 04/26/2018, 10:03 AM

## 2018-04-26 NOTE — Progress Notes (Signed)
Rehab admissions - I am following for potential acute inpatient rehab admission.  Noted PT/OT recommending CIR.  Insurances are closed today due to holiday.  I will follow up with patient tomorrow.  Call me for questions.  #[phone]

## 2018-04-27 ENCOUNTER — Encounter (HOSPITAL_COMMUNITY): Payer: Self-pay | Admitting: Orthopedic Surgery

## 2018-04-27 NOTE — Progress Notes (Signed)
Occupational Therapy Treatment Patient Details Name: [name] MRN: [id number] DOB: Apr 21, 1971 Today's Date: 04/27/2018    History of present illness 47yo female who fell at work adn has since had R ankle pain; she was found to have unstable R ankle fracture in the ED. She recieved R ankle ORIF for trimalleolar fracture on 04/23/18. PMH chiari malformation, hydrocephalus, spina bifida, hx L ankle arthroscopy, hx back surgery secondary to spina bifida, hx knee arthroscopy, head shunt externalization    OT comments  Pt very pleasant and participative  Follow Up Recommendations  CIR    Equipment Recommendations  3 in 1 bedside commode    Recommendations for Other Services      Precautions / Restrictions Precautions Precautions: Fall Precaution Comments: NWB R ankle  Restrictions Weight Bearing Restrictions: Yes RLE Weight Bearing: Non weight bearing       Mobility Bed Mobility Overal bed mobility: Needs Assistance Bed Mobility: Sit to Supine       Sit to supine: Min assist;Mod assist   General bed mobility comments: assisted back to bed  Transfers Overall transfer level: Needs assistance Equipment used: None Transfers: Squat Pivot Transfers   Stand pivot transfers: Mod assist Squat pivot transfers: Mod assist          Balance Overall balance assessment: Needs assistance;History of Falls Sitting-balance support: Bilateral upper extremity supported;Feet supported Sitting balance-Leahy Scale: Good     Standing balance support: Bilateral upper extremity supported;During functional activity Standing balance-Leahy Scale: Fair                             ADL either performed or assessed with clinical judgement   ADL Overall ADL's : Needs assistance/impaired Eating/Feeding: Set up;Sitting   Grooming: Set up;Sitting   Upper Body Bathing: Set up;Sitting   Lower Body Bathing: Cueing for sequencing;Cueing for safety;Sitting/lateral  leans;Minimal assistance   Upper Body Dressing : Set up;Sitting   Lower Body Dressing: Sitting/lateral leans;Moderate assistance Lower Body Dressing Details (indicate cue type and reason): Pt sat EOB and performed bath.  Pt needed increased time as bottom was dirty from BM .   Toilet Transfer: Squat-pivot;Moderate assistance Toilet Transfer Details (indicate cue type and reason): squat pivot to recliner to left side Toileting- Clothing Manipulation and Hygiene: Sitting/lateral lean;Moderate assistance Toileting - Clothing Manipulation Details (indicate cue type and reason): sitting EOB leaning left and right        General ADL Comments: pt performed bathing EOB     Vision Patient Visual Report: No change from baseline            Cognition Arousal/Alertness: Awake/alert Behavior During Therapy: WFL for tasks assessed/performed Overall Cognitive Status: Within Functional Limits for tasks assessed                                                     Pertinent Vitals/ Pain       Pain Assessment: No/denies pain Pain Score: 2  Pain Location: R ankle Pain Descriptors / Indicators: Discomfort Pain Intervention(s): Limited activity within patient's tolerance;Repositioned;Monitored during session         Frequency  Min 2X/week        Progress Toward Goals  OT Goals(current goals can now be found in the care plan section)  Progress towards OT goals: Progressing  toward goals     Plan         AM-PAC PT "6 Clicks" Daily Activity     Outcome Measure   Help from another person eating meals?: A Little Help from another person taking care of personal grooming?: A Little Help from another person toileting, which includes using toliet, bedpan, or urinal?: Total Help from another person bathing (including washing, rinsing, drying)?: A Lot Help from another person to put on and taking off regular upper body clothing?: A Little Help from another person to  put on and taking off regular lower body clothing?: Total 6 Click Score: 13    End of Session    OT Visit Diagnosis: Unsteadiness on feet (R26.81);Other abnormalities of gait and mobility (R26.89);History of falling (Z91.81);Pain Pain - Right/Left: Right Pain - part of body: Ankle and joints of foot   Activity Tolerance Patient tolerated treatment well   Patient Left in chair;with call bell/phone within reach   Nurse Communication Mobility status        Time: 0930-1004 OT Time Calculation (min): 34 min  Charges: OT General Charges $OT Visit: 1 Visit OT Treatments $Self Care/Home Management : 23-37 mins  [place], [place] [phone]   Einar Crow D 04/27/2018, 10:38 AM

## 2018-04-27 NOTE — PMR Pre-admission (Signed)
[facility] PMR Admission Coordinator Pre-Admission Assessment  Patient: [name] is an 47 y.o., female MRN: [id number] DOB: 13-Jul-1971 Height: 5' (152.4 cm) Weight: 123.1 kg (271 lb 5 oz)  Insurance Information HMO:     PPO:       PCP:       IPA:       80/20:       OTHER:   PRIMARY: [name], Worker's Comp       Policy#: [id number]      Subscriber: patient CM Name: [name]      Phone#: [phone]     Fax#: [phone], Alternate fax#: [phone] Pre-Cert#: Claim # [id number] for an initial 7 days, 04/29/18-05/05/18    Employer: [employer] Benefits:  Phone #: [phone]     Name:   [name]. Date:      Deduct:       Out of Pocket Max:       Life Max:  CIR: Per Worker's Comp      SNF:  Outpatient:      Co-Pay:  Home Health:       Co-Pay:  DME:      Co-Pay:  Providers: Per Worker's Comp  Emergency Conservator, museum/gallery Information    Name Relation Home Work Mobile   Ettrick Spouse [phone]       Current Medical History  Patient Admitting Diagnosis:  Right ankle fracture  History of Present Illness: a 47 y.o. female who complains of right ankle pain following a fall at work last week.  She was evaluated in the emergency department and found to have an unstable right ankle fracture.  She presented to Dr. Aundria Rud office last week where she was evaluated and recommendation was made for operative fixation.  She was admitted to [facility] of 04/23/18 and underwent an ORIF of a right trimaleolar ankle fracture.  PT/OT evaluations were completed with recommendations for acute inpatient rehab admission and patient admitted 04/29/18.                                                                                        Patient's medical record from [facility] has been reviewed by the rehabilitation admission coordinator and physician.  Past Medical History  Past Medical History:   Diagnosis Date  . Chiari malformation type II (HCC)   . GERD (gastroesophageal reflux disease)   . Hydrocephalus   . Malignant hyperthermia   . Osteoarthritis   . Spina bifida [facility])     Family History   family history is not on file.  Prior Rehab/Hospitalizations Has the patient had major surgery during 100 days prior to admission? No   Current Medications See MAR from [facility]  Patients Current Diet:  Regular diet, thin liquids  Precautions / Restrictions Precautions Precautions: Fall Precaution Comments: NWB R ankle  Restrictions Weight Bearing Restrictions: Yes RLE Weight Bearing: Non weight bearing   Has the patient had 2 or more falls or a fall with injury in the past year?Yes.  Patient reports 5-10 falls in the past year, with injuries.  Prior Activity Level Community (  5-7x/wk): Went out daily, worked in Clinical biochemist at [employer].  Prior Functional Level Self Care: Did the patient need help bathing, dressing, using the toilet or eating? Independent  Indoor Mobility: Did the patient need assistance with walking from room to room (with or without device)? Independent  Stairs: Did the patient need assistance with internal or external stairs (with or without device)? Dependent  Functional Cognition: Did the patient need help planning regular tasks such as shopping or remembering to take medications? Independent  Home Assistive Devices / Equipment Home Assistive Devices/Equipment: Other (Comment), Electric scooter, Grab bars around toilet, Eyeglasses Home Equipment: Environmental consultant - 2 wheels, Electric scooter  Prior Device Use: Indicate devices/aids used by the patient prior to current illness, exacerbation or injury? Motorized wheelchair or scooter and Environmental consultant   Prior Functional Level Current Functional Level  Bed Mobility  Independent  Mod assist   Transfers  Independent  Mod assist   Mobility - Walk/Wheelchair  Independent  Mod  assist   Upper Body Dressing  Independent  Other(set up)   Lower Body Dressing  Independent  Mod assist   Grooming  Independent  Other(set up)   Eating/Drinking  Independent  Other(set up)   Toilet Transfer  Independent  Mod assist   Bladder Continence   WDL  Urgency   Bowel Management  WDL  Last BM 04/26/18   Stair Climbing  Dependent, takes the elevator  Other(Not tried)   Civil engineer, contracting, pleasant   Memory  WDL  WDL   Cooking/Meal Prep  Independent      Housework  Independent    Money Management  Independent    Driving  Yes, independent      Special needs/care consideration BiPAP/CPAP No CPM No Continuous Drip IV No Dialysis No       Life Vest NO Oxygen No Special Bed No Trach Size No Wound Vac (area) No      Skin Has post op incision with dressing right ankle                        Bowel mgmt: Last BM 04/26/18 Bladder mgmt: Urgency Diabetic mgmt No  Previous Home Environment Living Arrangements: Spouse/significant other Available Help at Discharge: Family, Available 24 hours/day Type of Home: House Home Layout: One level Home Access: Ramped entrance Bathroom Shower/Tub: Engineer, manufacturing systems: Standard Home Care Services: No  Discharge Living Setting Plans for Discharge Living Setting: Patient's home, House, Lives with (comment)(Lives with husband and 68 yo son.) Type of Home at Discharge: House Discharge Home Layout: One level Discharge Home Access: Ramped entrance Does the patient have any problems obtaining your medications?: No  Social/Family/Support Systems Patient Roles: Spouse, Parent(Has a husband and a 60 yo son.) Contact Information: Donis Kotowski - spouse Anticipated Caregiver: self and husband Anticipated Caregiver's Contact Information: [name] - husband - [phone] Ability/Limitations of Caregiver: Husband works Medical laboratory scientific officer: Intermittent Discharge Plan Discussed with Primary  Caregiver: Yes Is Caregiver In Agreement with Plan?: Yes Does Caregiver/Family have Issues with Lodging/Transportation while Pt is in Rehab?: No  Goals/Additional Needs Patient/Family Goal for Rehab: PT/OT mod I goals Expected length of stay: 7-10 days Cultural Considerations: None Dietary Needs: Regular diet, thin liquids Equipment Needs: TBD Pt/Family Agrees to Admission and willing to participate: Yes Program Orientation Provided & Reviewed with Pt/Caregiver Including Roles  & Responsibilities: Yes  Patient Condition: I have reviewed all clinical records and I have spoken with this patient.  She  would like to come to inpatient rehab for therapies.  She will benefit and can tolerate 3 hours of therapy a day.  She needs the coordinated efforts of the rehab team to regain her prior level of function.  I have shared all information with rehab MD and I have approval for acute inpatient rehab admission.  Preadmission Screen Completed By: Roderic Palau with brief updates by Fae Pippin, 04/29/2018 10:28 AM ______________________________________________________________________   Discussed status with Dr. Wynn Banker on 04/29/18 at 1028 and received telephone approval for admission today.  Admission Coordinator:  Fae Pippin, time 1028/Date 04/29/18   Assessment/Plan: Diagnosis:RIGHT TRIMALLEOLAR FRACTURE DUE TO FALL S/P ORIF WITH NWB 1. Does the need for close, 24 hr/day  Medical supervision in concert with the patient's rehab needs make it unreasonable for this patient to be served in a less intensive setting? Yes 2. Co-Morbidities requiring supervision/potential complications: POST OP PAIN, WOUND CARE 3. Due to bladder management, bowel management, safety, skin/wound care, disease management, medication administration, pain management and patient education, does the patient require 24 hr/day rehab nursing? Yes 4. Does the patient require coordinated care of a physician, rehab nurse, PT (1-2  hrs/day, 5 days/week) and OT (1-2 hrs/day, 5 days/week) to address physical and functional deficits in the context of the above medical diagnosis(es)? Yes Addressing deficits in the following areas: balance, endurance, locomotion, strength, transferring, bowel/bladder control, bathing, dressing, toileting and psychosocial support 5. Can the patient actively participate in an intensive therapy program of at least 3 hrs of therapy 5 days a week? Yes 6. The potential for patient to make measurable gains while on inpatient rehab is excellent 7. Anticipated functional outcomes upon discharge from inpatients are: modified independent PT, modified independent OT, n/a SLP 8. Estimated rehab length of stay to reach the above functional goals is: 7D 9. Does the patient have adequate social supports to accommodate these discharge functional goals? Yes 10. Anticipated D/C setting: Home 11. Anticipated post D/C treatments: HH therapy 12. Overall Rehab/Functional Prognosis: excellent    RECOMMENDATIONS: This patient's condition is appropriate for continued rehabilitative care in the following setting: CIR Patient has agreed to participate in recommended program. Potentially Note that insurance prior authorization may be required for reimbursement for recommended care.  Comment:  Fae Pippin 04/29/2018

## 2018-04-27 NOTE — Progress Notes (Signed)
Orthopedics Progress Note  Subjective: Pt doing well this am, no complaints of SOB/CP/N/V.  Pain well controlled   Objective:  Vitals:   04/26/18 2138 04/27/18 0530  BP: 128/72 130/64  Pulse: 88 75  Resp: 18 18  Temp: 98.9 F (37.2 C) 98.7 F (37.1 C)  SpO2: 96% 98%    General: Awake and alert  Musculoskeletal: RLE- splinted, in good repair.  No drainage.  Decreased sensation in SP, otherwise intact in DP and tib N.  Cap refill < 2sec  Lab Results  Component Value Date   WBC 11.4 (H) 04/23/2018   HGB 13.7 04/23/2018   HCT 41.4 04/23/2018   MCV 92.0 04/23/2018   PLT 328 04/23/2018       Component Value Date/Time   NA 139 04/21/2018 1210   K 4.8 04/21/2018 1210   CL 104 04/21/2018 1210   CO2 23 04/21/2018 1210   GLUCOSE 99 04/21/2018 1210   BUN 22 (H) 04/21/2018 1210   CREATININE 0.85 04/23/2018 1150   CALCIUM 9.6 04/21/2018 1210   GFRNONAA >60 04/23/2018 1150   GFRAA >60 04/23/2018 1150    No results found for: INR, PROTIME  Assessment/Plan: POD #4 s/p Procedure(s): OPEN REDUCTION INTERNAL FIXATION (ORIF) TRIMALLEOLAR ANKLE FRACTURE - maintain inpatient until disposition and placement finalized. Continue NWB on the right leg  Noah Delaine Larua Collier,MD 04/27/2018 1:31 PM

## 2018-04-28 NOTE — Progress Notes (Addendum)
Inpatient Rehabilitation  Continuing to follow for timing of medical readiness, insurance approval, and IP Rehab bed availability.  I have called worker's Designer, industrial/product x2 today requesting an update.  Will plan to update team when I have a decision.  Call if questions.   Update: Spoke with worker's Designer, industrial/product and she reported that she needed to speak with the patient and evaluate the case.  Update dated the nurse case manager.  Will update team when I have a decision.  Call if questions.   Charlane Ferretti., CCC/SLP Admission Coordinator  [facility] Inpatient Rehabilitation  Cell [phone]

## 2018-04-28 NOTE — Progress Notes (Signed)
Orthopedics Progress Note  Subjective: Pt doing well, no complaints of SOB/CP/N/V.  Pain well controlled   Objective:  Vitals:   04/28/18 0522 04/28/18 1538  BP: 118/69 129/76  Pulse: 77 (!) 101  Resp: 16 16  Temp: 98.1 F (36.7 C) 98.1 F (36.7 C)  SpO2: 93% 97%    General: Awake and alert  Musculoskeletal: RLE- splinted, in good repair.  No drainage.  Decreased sensation in SP, otherwise intact in DP and tib N.  Cap refill < 2sec  Lab Results  Component Value Date   WBC 11.4 (H) 04/23/2018   HGB 13.7 04/23/2018   HCT 41.4 04/23/2018   MCV 92.0 04/23/2018   PLT 328 04/23/2018       Component Value Date/Time   NA 139 04/21/2018 1210   K 4.8 04/21/2018 1210   CL 104 04/21/2018 1210   CO2 23 04/21/2018 1210   GLUCOSE 99 04/21/2018 1210   BUN 22 (H) 04/21/2018 1210   CREATININE 0.85 04/23/2018 1150   CALCIUM 9.6 04/21/2018 1210   GFRNONAA >60 04/23/2018 1150   GFRAA >60 04/23/2018 1150    No results found for: INR, PROTIME  Assessment/Plan: POD #5 s/p Procedure(s): OPEN REDUCTION INTERNAL FIXATION (ORIF) TRIMALLEOLAR ANKLE FRACTURE - maintain inpatient until disposition and placement finalized. Continue NWB on the right leg - awainting work Tax adviser.  Will hold as inpatient until clafified - continue PT/OT - elevate.   SCD and lovenox for DVT ppx  Noah Delaine Rogers,MD 04/28/2018 3:40 PM

## 2018-04-29 ENCOUNTER — Inpatient Hospital Stay (HOSPITAL_COMMUNITY)
Admission: RE | Admit: 2018-04-29 | Discharge: 2018-05-12 | DRG: 560 | Disposition: A | Discharge status: Home Health | Payer: No Typology Code available for payment source | Source: Intra-hospital | Attending: Physical Medicine & Rehabilitation | Admitting: Physical Medicine & Rehabilitation

## 2018-04-29 ENCOUNTER — Encounter (HOSPITAL_COMMUNITY): Payer: Self-pay | Admitting: *Deleted

## 2018-04-29 DIAGNOSIS — Q059 Spina bifida, unspecified: Secondary | ICD-10-CM | POA: Diagnosis not present

## 2018-04-29 DIAGNOSIS — K219 Gastro-esophageal reflux disease without esophagitis: Secondary | ICD-10-CM | POA: Diagnosis present

## 2018-04-29 DIAGNOSIS — E46 Unspecified protein-calorie malnutrition: Secondary | ICD-10-CM | POA: Diagnosis not present

## 2018-04-29 DIAGNOSIS — Z982 Presence of cerebrospinal fluid drainage device: Secondary | ICD-10-CM

## 2018-04-29 DIAGNOSIS — Q0703 Arnold-Chiari syndrome with spina bifida and hydrocephalus: Secondary | ICD-10-CM

## 2018-04-29 DIAGNOSIS — M8580 Other specified disorders of bone density and structure, unspecified site: Secondary | ICD-10-CM | POA: Diagnosis present

## 2018-04-29 DIAGNOSIS — Z6841 Body Mass Index (BMI) 40.0 and over, adult: Secondary | ICD-10-CM

## 2018-04-29 DIAGNOSIS — S82852A Displaced trimalleolar fracture of left lower leg, initial encounter for closed fracture: Secondary | ICD-10-CM

## 2018-04-29 DIAGNOSIS — Z8669 Personal history of other diseases of the nervous system and sense organs: Secondary | ICD-10-CM | POA: Diagnosis not present

## 2018-04-29 DIAGNOSIS — D62 Acute posthemorrhagic anemia: Secondary | ICD-10-CM | POA: Diagnosis present

## 2018-04-29 DIAGNOSIS — R2689 Other abnormalities of gait and mobility: Secondary | ICD-10-CM

## 2018-04-29 DIAGNOSIS — Q052 Lumbar spina bifida with hydrocephalus: Secondary | ICD-10-CM | POA: Diagnosis not present

## 2018-04-29 DIAGNOSIS — Z8781 Personal history of (healed) traumatic fracture: Secondary | ICD-10-CM | POA: Diagnosis not present

## 2018-04-29 DIAGNOSIS — E8809 Other disorders of plasma-protein metabolism, not elsewhere classified: Secondary | ICD-10-CM

## 2018-04-29 DIAGNOSIS — W19XXXD Unspecified fall, subsequent encounter: Secondary | ICD-10-CM | POA: Diagnosis present

## 2018-04-29 DIAGNOSIS — K5900 Constipation, unspecified: Secondary | ICD-10-CM | POA: Diagnosis present

## 2018-04-29 DIAGNOSIS — Y990 Civilian activity done for income or pay: Secondary | ICD-10-CM | POA: Diagnosis not present

## 2018-04-29 DIAGNOSIS — S82851D Displaced trimalleolar fracture of right lower leg, subsequent encounter for closed fracture with routine healing: Secondary | ICD-10-CM | POA: Diagnosis present

## 2018-04-29 DIAGNOSIS — S82899A Other fracture of unspecified lower leg, initial encounter for closed fracture: Secondary | ICD-10-CM

## 2018-04-29 DIAGNOSIS — Z87728 Personal history of other specified (corrected) congenital malformations of nervous system and sense organs: Secondary | ICD-10-CM

## 2018-04-29 DIAGNOSIS — R52 Pain, unspecified: Secondary | ICD-10-CM

## 2018-04-29 DIAGNOSIS — E669 Obesity, unspecified: Secondary | ICD-10-CM | POA: Diagnosis present

## 2018-04-29 DIAGNOSIS — S82851S Displaced trimalleolar fracture of right lower leg, sequela: Secondary | ICD-10-CM | POA: Diagnosis not present

## 2018-04-29 DIAGNOSIS — M7989 Other specified soft tissue disorders: Secondary | ICD-10-CM | POA: Diagnosis not present

## 2018-04-29 LAB — CBC
HCT: 38.9 % (ref 36.0–46.0)
Hemoglobin: 12.6 g/dL (ref 12.0–15.0)
MCH: 29.4 pg (ref 26.0–34.0)
MCHC: 32.4 g/dL (ref 30.0–36.0)
MCV: 90.7 fL (ref 78.0–100.0)
Platelets: 416 10*3/uL — ABNORMAL HIGH (ref 150–400)
RBC: 4.29 MIL/uL (ref 3.87–5.11)
RDW: 13.3 % (ref 11.5–15.5)
WBC: 7 10*3/uL (ref 4.0–10.5)

## 2018-04-29 LAB — TRIGLYCERIDES: TRIGLYCERIDES: 92 mg/dL (ref <150)

## 2018-04-29 LAB — CREATININE, SERUM
Creatinine, Ser: 0.79 mg/dL (ref 0.44–1.00)
GFR calc non Af Amer: >60 mL/min (ref >60)

## 2018-04-29 MED ORDER — ENOXAPARIN SODIUM 40 MG/0.4ML ~~LOC~~ SOLN: 40.0000 mg | SUBCUTANEOUS | Status: DC

## 2018-04-29 MED ORDER — ENOXAPARIN SODIUM 40 MG/0.4ML ~~LOC~~ SOLN
40.0000 mg | SUBCUTANEOUS | Status: DC
Start: 2018-04-29 — End: 2018-05-12
  Administered 2018-04-29 – 2018-05-11 (×12): 40 mg via SUBCUTANEOUS
  Filled 2018-04-29 (×14): qty 0.4

## 2018-04-29 MED ORDER — ACETAMINOPHEN 325 MG PO TABS
650.0000 mg | ORAL_TABLET | Freq: Four times a day (QID) | ORAL | Status: DC | PRN
  Administered 2018-05-04 – 2018-05-10 (×7): 650 mg via ORAL
  Filled 2018-04-29 (×7): qty 2

## 2018-04-29 MED ORDER — ACETAMINOPHEN 650 MG RE SUPP: 650.0000 mg | Freq: Four times a day (QID) | RECTAL | Status: DC | PRN

## 2018-04-29 MED ORDER — DOCUSATE SODIUM 100 MG PO CAPS
100.0000 mg | ORAL_CAPSULE | Freq: Two times a day (BID) | ORAL | Status: DC
  Administered 2018-04-29 – 2018-05-11 (×4): 100 mg via ORAL
  Filled 2018-04-29 (×18): qty 1

## 2018-04-29 MED ORDER — HYDROCODONE-ACETAMINOPHEN 7.5-325 MG PO TABS
1.0000 | ORAL_TABLET | ORAL | Status: DC | PRN
Start: 2018-04-29 — End: 2018-05-12
  Administered 2018-05-06 – 2018-05-07 (×2): 1 via ORAL
  Administered 2018-05-10: 2 via ORAL
  Administered 2018-05-11: 1 via ORAL
  Filled 2018-04-29: qty 2
  Filled 2018-04-29 (×4): qty 1

## 2018-04-29 MED ORDER — METHOCARBAMOL 500 MG PO TABS: 500.0000 mg | ORAL_TABLET | Freq: Four times a day (QID) | ORAL | Status: DC | PRN

## 2018-04-29 MED ORDER — METHOCARBAMOL 1000 MG/10ML IJ SOLN
500.0000 mg | Freq: Four times a day (QID) | INTRAVENOUS | Status: DC | PRN
  Filled 2018-04-29: qty 5

## 2018-04-29 MED ORDER — ONDANSETRON HCL 4 MG PO TABS: 4.0000 mg | ORAL_TABLET | Freq: Four times a day (QID) | ORAL | Status: DC | PRN

## 2018-04-29 MED ORDER — DULOXETINE HCL 30 MG PO CPEP
30.0000 mg | ORAL_CAPSULE | Freq: Every day | ORAL | Status: DC
  Administered 2018-04-29 – 2018-05-11 (×13): 30 mg via ORAL
  Filled 2018-04-29 (×13): qty 1

## 2018-04-29 MED ORDER — ONDANSETRON HCL 4 MG/2ML IJ SOLN: 4.0000 mg | Freq: Four times a day (QID) | INTRAMUSCULAR | Status: DC | PRN

## 2018-04-29 MED ORDER — IBUPROFEN 400 MG PO TABS
400.0000 mg | ORAL_TABLET | Freq: Three times a day (TID) | ORAL | Status: DC | PRN
  Administered 2018-05-01: 600 mg via ORAL
  Administered 2018-05-03 (×2): 400 mg via ORAL
  Administered 2018-05-04 – 2018-05-12 (×4): 600 mg via ORAL
  Filled 2018-04-29: qty 2
  Filled 2018-04-29: qty 1
  Filled 2018-04-29: qty 2
  Filled 2018-04-29: qty 1
  Filled 2018-04-29 (×2): qty 2
  Filled 2018-04-29: qty 1
  Filled 2018-04-29: qty 2

## 2018-04-29 NOTE — Progress Notes (Signed)
Patient ID: [name], female   DOB: 26-Jan-1971, 47 y.o.   MRN: [id number] Patient arrived via [facility] from [place]. Patient oriented to room, nurse call system, rehab schedule, rehab process, fall prevention plan, safety plan, and health resource notebook wit verbal understanding. Patient informed this RN Optometrist the urgency with bladder is pre-existing and any bowel incontinence is pre-existing. She wore a brief prior to admission to the hospital. She has no complaints of pain. She in resting comfortably in bed with bed alarm on and call bell at her side. Patient prefers all 4 side rails up as indicated on the safety plan and the care plan.

## 2018-04-29 NOTE — H&P (Signed)
Physical Medicine and Rehabilitation Admission H&P    Chief complaint: Weakness  HPI: [name] is a 47 year old right-handed female with history of obesity, Chiari malformation, hydrocephalus with shunting 2006 by Dr. Trey Sailors, spina bifida with back surgery in 2012 at the [facility] receiving inpatient rehab services..  Per chart review patient lives with spouse.  Patient still works.  Independent with self-care.  She would require an electric scooter for longer distances.  One level home with ramped entrance.  Family plans to assist as needed.  Presented 04/23/2018 after recent fall approximate 1 week ago while at work injuring her right ankle.  Follow-up outpatient with orthopedic services Dr. Aundria Rud after x-rays revealed right ankle trimalleolar fracture.  Underwent ORIF 04/23/2018 per Dr. Aundria Rud.  Hospital course pain management.  Nonweightbearing right lower extremity.  Subcutaneous Lovenox for DVT prophylaxis.  On physical and occupational therapy evaluations completed with recommendations of physical medicine rehab consult.  Patient was admitted for a comprehensive rehab program.  Patient states that at home her usual bowel pattern is 2-3 times per week She has chronic bladder urgency and occasional incontinence  Review of Systems  Constitutional: Negative for chills and fever.  HENT: Negative for hearing loss.   Eyes: Negative for blurred vision and double vision.  Respiratory: Negative for cough and shortness of breath.   Cardiovascular: Positive for leg swelling. Negative for palpitations.  Gastrointestinal: Positive for constipation. Negative for nausea.       GERD  Genitourinary: Negative for flank pain and hematuria.  Musculoskeletal: Positive for back pain and falls.  Skin: Negative for rash.  Neurological:       Occasional headaches  All other systems reviewed and are negative.      Past Medical History:  Diagnosis Date  . Chiari malformation type  II (HCC)   . GERD (gastroesophageal reflux disease)   . Hydrocephalus   . Malignant hyperthermia   . Osteoarthritis   . Spina bifida [facility])         Past Surgical History:  Procedure Laterality Date  . ANKLE ARTHROSCOPY Left    related to Tendon  . BACK SURGERY  1971   Spinal Bifida  . KNEE ARTHROSCOPY Left 1983  . ORIF ANKLE FRACTURE Right 04/23/2018   Procedure: OPEN REDUCTION INTERNAL FIXATION (ORIF) TRIMALLEOLAR ANKLE FRACTURE;  Surgeon: Yolonda Kida, MD;  Location: [facility];  Service: Orthopedics;  Laterality: Right;  120 mins  . SHUNT EXTERNALIZATION     Head x 2   History reviewed. No pertinent family history. Social History:  reports that she has never smoked. She has never used smokeless tobacco. She reports that she does not drink alcohol or use drugs. Allergies:       Allergies  Allergen Reactions  . Anesthetics, Amide Other (See Comments)    Pt has Malignant Hyperthermia which is an allergy to general anesthesia  . Gabapentin Other (See Comments)    Depression.  Marland Kitchen Percocet [Oxycodone-Acetaminophen] Itching  . Latex Rash  . Other Rash    Latex Adhesive tape: prefers to use paper tape         Medications Prior to Admission  Medication Sig Dispense Refill  . DULoxetine (CYMBALTA) 30 MG capsule Take 30 mg by mouth daily.     Marland Kitchen HYDROcodone-acetaminophen (NORCO/VICODIN) 5-325 MG tablet Take 1-2 tablets by mouth every 6 (six) hours as needed for severe pain. (Patient taking differently: Take 1 tablet by mouth every 6 (six) hours as needed for severe pain. )  20 tablet 0  . ibuprofen (ADVIL,MOTRIN) 200 MG tablet Take 400-600 mg by mouth every 8 (eight) hours as needed for moderate pain.       Drug Regimen Review Drug regimen was reviewed and remains appropriate with no significant issues identified  Home: Home Living Family/patient expects to be discharged to:: Private residence Living Arrangements: Spouse/significant  other Available Help at Discharge: Family, Available 24 hours/day Type of Home: House Home Access: Ramped entrance Home Layout: One level Bathroom Shower/Tub: Engineer, manufacturing systems: Standard Home Equipment: Environmental consultant - 2 wheels, Research scientist (life sciences) History: Prior Function Level of Independence: Independent with assistive device(s) Comments: works, usually independent in self care, able to [employer] house hold distances with walker but does require electric scooter for longer distances   Functional Status:  Mobility: Bed Mobility Overal bed mobility: Needs Assistance Bed Mobility: Sit to Supine Supine to sit: Min assist Sit to supine: Min assist, Mod assist General bed mobility comments: assisted back to bed Transfers Overall transfer level: Needs assistance Equipment used: None Transfers: Squat Pivot Transfers Sit to Stand: Mod assist Stand pivot transfers: Mod assist Squat pivot transfers: Mod assist General transfer comment: squat pivot no walker from recliner to [facility] then from [facility] to bed all towards her LEFT with good hand placement, transition all while NWB R LE.   Ambulation/Gait General Gait Details: transfers only this session  ADL: ADL Overall ADL's : Needs assistance/impaired Eating/Feeding: Set up, Sitting Grooming: Set up, Sitting Upper Body Bathing: Set up, Sitting Lower Body Bathing: Cueing for sequencing, Cueing for safety, Sitting/lateral leans, Minimal assistance Upper Body Dressing : Set up, Sitting Lower Body Dressing: Sitting/lateral leans, Moderate assistance Lower Body Dressing Details (indicate cue type and reason): Pt sat EOB and performed bath.  Pt needed increased time as bottom was dirty from BM .   Toilet Transfer: Squat-pivot, Moderate assistance Statistician Details (indicate cue type and reason): squat pivot to recliner to left side Toileting- Clothing Manipulation and Hygiene: Sitting/lateral lean, Moderate  assistance Toileting - Clothing Manipulation Details (indicate cue type and reason): sitting EOB leaning left and right  General ADL Comments: pt performed bathing EOB  Cognition: Cognition Overall Cognitive Status: Within Functional Limits for tasks assessed Orientation Level: Oriented X4 Cognition Arousal/Alertness: Awake/alert Behavior During Therapy: WFL for tasks assessed/performed Overall Cognitive Status: Within Functional Limits for tasks assessed General Comments: very pleasant  Physical Exam: Blood pressure 118/69, pulse 77, temperature 98.1 F (36.7 C), temperature source Oral, resp. rate 16, height 5' (1.524 m), weight 123.1 kg (271 lb 5 oz), last menstrual period 04/08/2018, SpO2 93 %. Physical Exam  Vitals reviewed. Constitutional: She is oriented to person, place, and time.  HENT:  Head: Normocephalic.  Eyes: EOM are normal. Right eye exhibits no discharge. Left eye exhibits no discharge.  Neck: Normal range of motion. Neck supple. No thyromegaly present.  Cardiovascular: Normal rate, regular rhythm and normal heart sounds.  Respiratory: Effort normal and breath sounds normal. No respiratory distress.  GI: Soft. Bowel sounds are normal. She exhibits no distension.  Neurological: She is alert and oriented to person, place, and time.  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  Dressing has been changed  Motor strength is 5/5 bilateral deltoid bicep tricep grip left hip flexor knee extensor ankle dorsiflexor 4/5 right hip flexor knee extensor ankle not tested secondary to right lower  extremity splint at the ankle. Sensation reduced in the right great toe and little toe.  Intact sensation in the left great toe and little toe.        Medical Problem List and Plan: 1.  Decreased functional  mobility secondary to right trimalleolar ankle fracture status post ORIF 04/23/2018.  Nonweightbearing. 2.  DVT Prophylaxis/Anticoagulation: Subcutaneous Lovenox.  Check vascular study 3. Pain Management: Cymbalta 30 mg nightly, hydrocodone Robaxin and Advil as needed. 4. Mood: Provide emotional support 5. Neuropsych: This patient is capable of making decisions on her own behalf. 6. Skin/Wound Care: Routine skin checks 7. Fluids/Electrolytes/Nutrition: Routine in and outs with follow-up chemistries 8.  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  Recurrent hydrocephalus.  Latest shunt revision 2006 9.  Spina bifida.  Patient did use electric scooter for long distances. 10.  Constipation.  Laxative assistance 11.  Obesity.  BMI 52.99.  Follow-up dietary  Post Admission Physician Evaluation: 1. Functional deficits secondary  To right trimalleolar ankle fracture status post ORIF 04/23/2018.  Nonweightbearing . 2. Patient admitted to receive collaborative, interdisciplinary care between the physiatrist, rehab nursing staff, and therapy team. 3. Patient's level of medical complexity and substantial therapy needs in context of that medical necessity cannot be provided at a lesser intensity of care. 4. Patient has experienced substantial functional loss from his/her baseline.Judging by the patient's diagnosis, physical exam, and functional history, the patient has potential for functional progress which will result in measurable gains while on inpatient rehab.  These gains will be of substantial and practical use upon discharge in facilitating mobility and self-care at the household level. 5. Physiatrist will provide 24 hour management of medical needs as well as oversight of the therapy plan/treatment and provide guidance as appropriate regarding the interaction of the two. 6. 24 hour rehab nursing will assist in the management of  bladder management, bowel management, safety, skin/wound care,  disease management, medication administration, pain management and patient education  and help integrate therapy concepts, techniques,education, etc. 7. PT will assess and treat for:pre gait, gait training, endurance , safety, equipment, neuromuscular re education  .  Goals are: independent with assistive device. 8. OT will assess and treat for ADLs, Cognitive perceptual skills, Neuromuscular re education, safety, endurance, equipment  .  Goals are: independent with assistive device.  9. SLP will assess and treat for  .NA  Goals are: N/A. 10. Case Management and Social Worker will assess and treat for psychological issues and discharge planning. 11. Team conference will be held weekly to assess progress toward goals and to determine barriers to discharge. 12.  Patient will receive at least 3 hours of therapy per day at least 5 days per week. 13. ELOS and Prognosis: 7-10d excellent   "I have personally performed a face to face diagnostic evaluation of this patient.  Additionally, I have reviewed and concur with the physician assistant's documentation above." Erick Colace M.D. [facility] FAAPM&R (Sports Med, Neuromuscular Med) Diplomate Am Board of Electrodiagnostic Med    Lynnae Prude 04/28/2018

## 2018-04-29 NOTE — Plan of Care (Signed)
Patient discharged to inpatient rehab at [facility]. Report given to [facility]. The patient left with [facility], [facility] given all paperwork. IV removed. Patient left in stable condition with all belongings.

## 2018-04-29 NOTE — Progress Notes (Signed)
[employer] PMR Admission Coordinator Pre-Admission Assessment  Patient: [name] is an 47 y.o., female MRN: [id number] DOB: January 16, 1971 Height: 5' (152.4 cm) Weight: 123.1 kg (271 lb 5 oz)  Insurance Information HMO:     PPO:       PCP:       IPA:       80/20:       OTHER:   PRIMARY: [name], Worker's Comp               Policy#: [id number]      Subscriber: patient CM Name: [name]      Phone#: [phone]     Fax#: [phone], Alternate fax#: [phone] Pre-Cert#: Claim # [id number] for an initial 7 days, 04/29/18-05/05/18    Employer: [employer] Benefits:  Phone #: [phone]     Name:   [name]. Date:      Deduct:       Out of Pocket Max:       Life Max:  CIR: Per Worker's Comp      SNF:  Outpatient:      Co-Pay:  Home Health:       Co-Pay:  DME:      Co-Pay:  Providers: Per Worker's Comp  Emergency Chief Operating Officer Information    Name Relation Home Work Mobile   [name] Spouse [phone]       Current Medical History  Patient Admitting Diagnosis:  Right ankle fracture  History of Present Illness: a 47 y.o.femalewho complains of right ankle pain following a fall at work last week. She was evaluated in the emergency department and found to have an unstable right ankle fracture. She presented to Dr. Aundria Rud office last week where she was evaluated and recommendation was made for operative fixation.  She was admitted to [facility] 04/23/18 and underwent an ORIF of a right trimaleolar ankle fracture.  PT/OT evaluations were completed with recommendations for acute inpatient rehab admission and patient admitted 04/29/18.                                                                                         Patient's medical record from [facility] has been reviewed by the rehabilitation admission coordinator and physician.  Past Medical History      Past Medical History:  Diagnosis Date  . Chiari malformation type II (HCC)   . GERD (gastroesophageal reflux disease)   . Hydrocephalus   . Malignant hyperthermia   . Osteoarthritis   . Spina bifida [facility])     Family History   family history is not on file.  Prior Rehab/Hospitalizations Has the patient had major surgery during 100 days prior to admission? No              Current Medications See MAR from [facility]  Patients Current Diet:  Regular diet, thin liquids  Precautions / Restrictions Precautions Precautions: Fall Precaution Comments: NWB R ankle  Restrictions Weight Bearing Restrictions: Yes RLE Weight Bearing: Non weight bearing   Has the patient had 2 or more  falls or a fall with injury in the past year?Yes.  Patient reports 5-10 falls in the past year, with injuries.  Prior Activity Level Community (5-7x/wk): Went out daily, worked in Clinical biochemist at [employer].  Prior Functional Level Self Care: Did the patient need help bathing, dressing, using the toilet or eating? Independent  Indoor Mobility: Did the patient need assistance with walking from room to room (with or without device)? Independent  Stairs: Did the patient need assistance with internal or external stairs (with or without device)? Dependent  Functional Cognition: Did the patient need help planning regular tasks such as shopping or remembering to take medications? Independent  Home Assistive Devices / Equipment Home Assistive Devices/Equipment: Other (Comment), Electric scooter, Grab bars around toilet, Eyeglasses Home Equipment: Environmental consultant - 2 wheels, Electric scooter  Prior Device Use: Indicate devices/aids used by the patient prior to current illness, exacerbation or injury? Motorized wheelchair or scooter and Environmental consultant   Prior Functional Level Current Functional Level  Bed Mobility  Independent  Mod assist   Transfers  Independent   Mod assist   Mobility - Walk/Wheelchair  Independent  Mod assist   Upper Body Dressing  Independent  Other(set up)   Lower Body Dressing  Independent  Mod assist   Grooming  Independent  Other(set up)   Eating/Drinking  Independent  Other(set up)   Toilet Transfer  Independent  Mod assist   Bladder Continence   WDL  Urgency   Bowel Management  WDL  Last BM 04/26/18   Stair Climbing  Dependent, takes the elevator  Other(Not tried)   Civil engineer, contracting, pleasant   Memory  WDL  WDL   Cooking/Meal Prep  Independent      Housework  Independent    Money Management  Independent    Driving  Yes, independent      Special needs/care consideration BiPAP/CPAP No CPM No Continuous Drip IV No Dialysis No       Life Vest NO Oxygen No Special Bed No Trach Size No Wound Vac (area) No      Skin Has post op incision with dressing right ankle                        Bowel mgmt: Last BM 04/26/18 Bladder mgmt: Urgency Diabetic mgmt No  Previous Home Environment Living Arrangements: Spouse/significant other Available Help at Discharge: Family, Available 24 hours/day Type of Home: House Home Layout: One level Home Access: Ramped entrance Bathroom Shower/Tub: Engineer, manufacturing systems: Standard Home Care Services: No  Discharge Living Setting Plans for Discharge Living Setting: Patient's home, House, Lives with (comment)(Lives with husband and 65 yo son.) Type of Home at Discharge: House Discharge Home Layout: One level Discharge Home Access: Ramped entrance Does the patient have any problems obtaining your medications?: No  Social/Family/Support Systems Patient Roles: Spouse, Parent(Has a husband and a 54 yo son.) Contact Information: [name] - spouse Anticipated Caregiver: self and husband Anticipated Caregiver's Contact Information: [name] - husband -  [phone] Ability/Limitations of Caregiver: Husband works Medical laboratory scientific officer: Intermittent Discharge Plan Discussed with Primary Caregiver: Yes Is Caregiver In Agreement with Plan?: Yes Does Caregiver/Family have Issues with Lodging/Transportation while Pt is in Rehab?: No  Goals/Additional Needs Patient/Family Goal for Rehab: PT/OT mod I goals Expected length of stay: 7-10 days Cultural Considerations: None Dietary Needs: Regular diet, thin liquids Equipment Needs: TBD Pt/Family Agrees to Admission and willing to participate: Yes Program Orientation Provided & Reviewed  with Pt/Caregiver Including Roles  & Responsibilities: Yes  Patient Condition: I have reviewed all clinical records and I have spoken with this patient.  She would like to come to inpatient rehab for therapies.  She will benefit and can tolerate 3 hours of therapy a day.  She needs the coordinated efforts of the rehab team to regain her prior level of function.  I have shared all information with rehab MD and I have approval for acute inpatient rehab admission.  Preadmission Screen Completed By: Roderic Palau with brief updates by Fae Pippin, 04/29/2018 10:28 AM ______________________________________________________________________   Discussed status with Dr. Wynn Banker on 04/29/18 at 1028 and received telephone approval for admission today.  Admission Coordinator:  Fae Pippin, time 1028/Date 04/29/18   Assessment/Plan: Diagnosis:RIGHT TRIMALLEOLAR FRACTURE DUE TO FALL S/P ORIF WITH NWB 1. Does the need for close, 24 hr/day  Medical supervision in concert with the patient's rehab needs make it unreasonable for this patient to be served in a less intensive setting? Yes 2. Co-Morbidities requiring supervision/potential complications: POST OP PAIN, WOUND CARE 3. Due to bladder management, bowel management, safety, skin/wound care, disease management, medication administration, pain management and patient  education, does the patient require 24 hr/day rehab nursing? Yes 4. Does the patient require coordinated care of a physician, rehab nurse, PT (1-2 hrs/day, 5 days/week) and OT (1-2 hrs/day, 5 days/week) to address physical and functional deficits in the context of the above medical diagnosis(es)? Yes Addressing deficits in the following areas: balance, endurance, locomotion, strength, transferring, bowel/bladder control, bathing, dressing, toileting and psychosocial support 5. Can the patient actively participate in an intensive therapy program of at least 3 hrs of therapy 5 days a week? Yes 6. The potential for patient to make measurable gains while on inpatient rehab is excellent 7. Anticipated functional outcomes upon discharge from inpatients are: modified independent PT, modified independent OT, n/a SLP 8. Estimated rehab length of stay to reach the above functional goals is: 7D 9. Does the patient have adequate social supports to accommodate these discharge functional goals? Yes 10. Anticipated D/C setting: Home 11. Anticipated post D/C treatments: HH therapy 12. Overall Rehab/Functional Prognosis: excellent    RECOMMENDATIONS: This patient's condition is appropriate for continued rehabilitative care in the following setting: CIR Patient has agreed to participate in recommended program. Potentially Note that insurance prior authorization may be required for reimbursement for recommended care.  Comment:  Fae Pippin 04/29/2018          Revision History

## 2018-04-29 NOTE — Progress Notes (Signed)
Inpatient Rehabilitation  I received approval from [employer]era with patient's worker's comp for an IP Rehab admission.  Notified nurse case manager as well as patient.  Requested that RN, Victorino Dike notify MD and arrange transport for 2pm.  Patient will be admitting to [phone] and report can be called to [phone].  If questions please call.    Charlane Ferretti., CCC/SLP Admission Coordinator  [facility] Inpatient Rehabilitation  Cell [phone]

## 2018-04-29 NOTE — Discharge Summary (Signed)
Patient ID: [name] MRN: [id number] DOB/AGE: 12/14/1970 47 y.o.  Admit date: 04/23/2018 Discharge date:   Primary Diagnosis: Right trimalleolar ankle fracture  Admission Diagnoses:  Past Medical History:  Diagnosis Date  . 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 GERD (gastroesophageal reflux disease)   . Hydrocephalus   . Malignant hyperthermia   . Osteoarthritis   . Spina bifida [facility])    Discharge Diagnoses:   Active Problems:   Closed displaced trimalleolar fracture of right ankle  Estimated body mass index is 52.99 kg/m as calculated from the following:   Height as of this encounter: 5' (1.524 m).   Weight as of this encounter: 123.1 kg (271 lb 5 oz).  Procedure:  Procedure(s) (LRB): OPEN REDUCTION INTERNAL FIXATION (ORIF) TRIMALLEOLAR ANKLE FRACTURE (Right)   Consults: rehabilitation medicine  HPI: [name] had an unfortunate work related injury resulting in a right closed trimalleolar ankle fracture that requires operative fixation.  She presented to the hospital for the surgery.  Laboratory Data: Admission on 04/23/2018  Component Date Value Ref Range Status  . WBC 04/21/2018 8.8  4.0 - 10.5 K/uL Final  . RBC 04/21/2018 4.76  3.87 - 5.11 MIL/uL Final  . Hemoglobin 04/21/2018 14.3  12.0 - 15.0 g/dL Final  . HCT 04/21/2018 43.5  36.0 - 46.0 % Final  . MCV 04/21/2018 91.4  78.0 - 100.0 fL Final  . MCH 04/21/2018 30.0  26.0 - 34.0 pg Final  . MCHC 04/21/2018 32.9  30.0 - 36.0 g/dL Final  . RDW 04/21/2018 13.6  11.5 - 15.5 % Final  . Platelets 04/21/2018 351  150 - 400 K/uL Final   Performed at [facility], [place] [address], [place], [place] [address]  . Sodium 04/21/2018 139  135 - 145 mmol/L Final  . Potassium 04/21/2018 4.8  3.5 - 5.1 mmol/L Final  . Chloride 04/21/2018 104  101 - 111 mmol/L Final  . CO2 04/21/2018 23  22 - 32 mmol/L Final  . Glucose, Bld 04/21/2018 99  65 - 99 mg/dL Final  . BUN 04/21/2018 22* 6 - 20 mg/dL Final  .  Creatinine, Ser 04/21/2018 0.88  0.44 - 1.00 mg/dL Final  . Calcium 04/21/2018 9.6  8.9 - 10.3 mg/dL Final  . GFR calc non Af Amer 04/21/2018 >60  >60 mL/min Final  . GFR calc Af Amer 04/21/2018 >60  >60 mL/min Final   Comment: (NOTE) The eGFR has been calculated using the CKD EPI equation. This calculation has not been validated in all clinical situations. eGFR's persistently <60 mL/min signify possible Chronic Kidney Disease.   Georgiann Hahn gap 04/21/2018 12  5 - 15 Final   Performed at [facility], [place] [address] [place], [place] [address]  . HIV Screen 4th Generation wRfx 04/23/2018 Non Reactive  Non Reactive Final   Comment: (NOTE) Performed At: [facility] [place], [place] [id number] Rush Farmer MD DE:[id number] Performed at [facility], [place] [address] [place], [place] [address]   . WBC 04/23/2018 11.4* 4.0 - 10.5 K/uL Final  . RBC 04/23/2018 4.50  3.87 - 5.11 MIL/uL Final  . Hemoglobin 04/23/2018 13.7  12.0 - 15.0 g/dL Final  . HCT 04/23/2018 41.4  36.0 - 46.0 % Final  . MCV 04/23/2018 92.0  78.0 - 100.0 fL Final  . MCH 04/23/2018 30.4  26.0 - 34.0 pg Final  . MCHC 04/23/2018 33.1  30.0 - 36.0 g/dL Final  . RDW 04/23/2018  13.5  11.5 - 15.5 % Final  . Platelets 04/23/2018 328  150 - 400 K/uL Final   Performed at [facility], [place] [address], [place], [place] [address]  . Creatinine, Ser 04/23/2018 0.85  0.44 - 1.00 mg/dL Final  . GFR calc non Af Amer 04/23/2018 >60  >60 mL/min Final  . GFR calc Af Amer 04/23/2018 >60  >60 mL/min Final   Comment: (NOTE) The eGFR has been calculated using the CKD EPI equation. This calculation has not been validated in all clinical situations. eGFR's persistently <60 mL/min signify possible Chronic Kidney Disease. Performed at [facility], [place] [address], [place], [place] [address]   . Triglycerides 04/29/2018 92  <150 mg/dL Final    Performed at [facility], [place] [address], [place], [place] [address]  Hospital Outpatient Visit on 04/21/2018  Component Date Value Ref Range Status  . Preg, Serum 04/21/2018 NEGATIVE  NEGATIVE Final   Comment:        THE SENSITIVITY OF THIS METHODOLOGY IS >10 mIU/mL. Performed at [facility], [place] [address] [place], [place] [address]      X-Rays:Dg Ankle 2 Views Right  Result Date: 04/23/2018 CLINICAL DATA:  Status post ankle repair EXAM: RIGHT ANKLE - 2 VIEW COMPARISON:  04/12/2018 FLUOROSCOPY TIME:  Radiation Exposure Index (as provided by the fluoroscopic device): 4.26 mGy If the device does not provide the exposure index: Fluoroscopy Time:  73 seconds Number of Acquired Images:  3 FINDINGS: Fixation sideplate is noted along the distal fibula with the fracture fragments in anatomic position. Suture based fixation devices are noted traversing the tibia and fibula. Two fixation screws are noted extending into the medial malleolus. IMPRESSION: ORIF of distal tibial and fibular fractures. Electronically Signed   By: Inez Catalina M.D.   On: 04/23/2018 09:39   Dg Ankle 2 Views Right  Result Date: 04/12/2018 CLINICAL DATA:  Post reduction EXAM: RIGHT ANKLE - 2 VIEW COMPARISON:  04/12/2018 FINDINGS: Comminuted fracture through the distal right fibular metaphysis with mild displacement. Fracture through the base of the medial malleolus. Probable posterior malleolar fracture. Disruption of the ankle mortise. Interval reduction of the previously seen dislocated talus. IMPRESSION: Trimalleolar fracture with disruption of the ankle mortise. Interval reduction of the dislocated talus. Electronically Signed   By: Rolm Baptise M.D.   On: 04/12/2018 10:03   Dg Ankle 2 Views Right  Result Date: 04/12/2018 CLINICAL DATA:  Status post fall. EXAM: RIGHT ANKLE - 2 VIEW COMPARISON:  None. FINDINGS: Generalized osteopenia. Comminuted oblique fracture of the distal fibular  diaphysis with apex medial angulation and 9 mm of lateral displacement. Comminuted and displaced medial malleolar fracture with 14 mm of lateral displacement. Disruption of the ankle mortise with the talar dome dislocated laterally by 17 mm. Displaced posterior malleolar fracture of the distal tibia displaced laterally. No other fracture or dislocation. IMPRESSION: 1. Trimalleolar displaced fracture of right ankle. Lateral dislocation of the talar dome relative to the tibial plafond. Electronically Signed   By: Kathreen Devoid   On: 04/12/2018 09:07   Ct Ankle Right Wo Contrast  Result Date: 04/12/2018 CLINICAL DATA:  Status post fall, trimalleolar fracture with interval reduction EXAM: CT OF THE RIGHT ANKLE WITHOUT CONTRAST TECHNIQUE: Multidetector CT imaging of the right ankle was performed according to the standard protocol. Multiplanar CT image reconstructions were also generated. COMPARISON:  None. FINDINGS: Bones/Joint/Cartilage Comminuted oblique fracture of the distal fibular diaphysis with 4 mm of lateral  displacement. Mild apex medial angulation. Displaced posterior malleolar fracture with the fracture fragment displaced laterally by 12 mm. Comminuted medial malleolar fracture with the medial malleolus displaced laterally by 13 mm. Widening of the tibiotalar joint most consistent with a syndesmotic injury. 14 mm lateral subluxation of the talar dome relative to the tibial plafond. Mild tibiotalar joint space narrowing. No significant ankle joint effusion. Normal subtalar joints. No other acute fracture or dislocation. Ligaments Suboptimally assessed by CT. Muscles and Tendons Flexor, extensor, and peroneal tendons are intact. Achilles tendon is intact. Plantar fascia is grossly intact. Soft tissues No fluid collection or hematoma.  Muscles are normal. IMPRESSION: 1. Trimalleolar fracture with lateral subluxation of the talar dome relative to the tibial plafond and as described above. Electronically Signed    By: Kathreen Devoid   On: 04/12/2018 12:59   Dg C-arm 1-60 Min-no Report  Result Date: 04/23/2018 Fluoroscopy was utilized by the requesting physician.  No radiographic interpretation.    EKG:No orders found for this or any previous visit.   Hospital Course: RELDA AGOSTO is a 47 y.o. who was admitted to Hospital. They were brought to the operating room on 04/23/2018 and underwent Procedure(s): OPEN REDUCTION INTERNAL FIXATION (ORIF) TRIMALLEOLAR ANKLE FRACTURE.  Patient tolerated the procedure well and was later transferred to the recovery room and then to the orthopaedic floor for postoperative care.  They were given PO and IV analgesics for pain control following their surgery.  They were given 24 hours of postoperative antibiotics of  Anti-infectives (From admission, onward)   Start     Dose/Rate Route Frequency Ordered Stop   04/23/18 0600  ceFAZolin (ANCEF) 3 g in dextrose 5 % 50 mL IVPB     3 g 100 mL/hr over 30 Minutes Intravenous On call to O.R. 04/22/18 1215 04/24/18 1344     and started on DVT prophylaxis in the form of Lovenox.   PT and OT were ordered for.  Discharge planning consulted to help with postop disposition and equipment needs.  Patient had a good night on the evening of surgery.  They started to get up OOB with therapy on day one.  Continued to work with therapy into day two.  Dressing was changed on day two and the incision was c/d/i.  By day three, the patient had progressed with therapy and meeting their goals.  Incision was healing well.  Patient was seen in rounds and was [facility]   Diet: Regular diet Activity:NWB Follow-up:in 2 weeks Disposition - Rehab Discharged Condition: good   Discharge Instructions    Call MD / Call 911   Complete by:  As directed    If you experience chest pain or shortness of breath, CALL 911 and be transported to the hospital emergency room.  If you develope a fever above 101 F, pus (white drainage) or  increased drainage or redness at the wound, or calf pain, call your surgeon's office.   Constipation Prevention   Complete by:  As directed    Drink plenty of fluids.  Prune juice may be helpful.  You may use a stool softener, such as Colace (over the counter) 100 mg twice a day.  Use MiraLax (over the counter) for constipation as needed.   Diet - low sodium heart healthy   Complete by:  As directed    Increase activity slowly as tolerated   Complete by:  As directed      Allergies as of  04/29/2018      Reactions   Anesthetics, Amide Other (See Comments)   Pt has Malignant Hyperthermia which is an allergy to general anesthesia   Gabapentin Other (See Comments)   Depression.   Percocet [oxycodone-acetaminophen] Itching   Latex Rash   Other Rash   Latex Adhesive tape: prefers to use paper tape      Medication List    STOP taking these medications   HYDROcodone-acetaminophen 5-325 MG tablet Commonly known as:  NORCO/VICODIN Replaced by:  HYDROcodone-acetaminophen 7.5-325 MG tablet     TAKE these medications   DULoxetine 30 MG capsule Commonly known as:  CYMBALTA Take 30 mg by mouth daily.   enoxaparin 40 MG/0.4ML injection Commonly known as:  LOVENOX Inject 0.4 mLs (40 mg total) into the skin daily.   HYDROcodone-acetaminophen 7.5-325 MG tablet Commonly known as:  NORCO Take 1-2 tablets by mouth every 6 (six) hours as needed for moderate pain. Replaces:  HYDROcodone-acetaminophen 5-325 MG tablet   ibuprofen 200 MG tablet Commonly known as:  ADVIL,MOTRIN Take 400-600 mg by mouth every 8 (eight) hours as needed for moderate pain.      Follow-up Information    Nicholes Stairs, MD In 2 weeks.   Specialty:  Orthopedic Surgery Why:  For suture removal, For wound re-check Contact information: [address] [place] [address] [phone]           Signed: Geralynn Rile, MD Orthopaedic Surgery 04/29/2018, 11:08 AM

## 2018-04-30 ENCOUNTER — Inpatient Hospital Stay (HOSPITAL_COMMUNITY): Payer: No Typology Code available for payment source | Admitting: Physical Therapy

## 2018-04-30 ENCOUNTER — Other Ambulatory Visit: Payer: Self-pay

## 2018-04-30 ENCOUNTER — Inpatient Hospital Stay (HOSPITAL_COMMUNITY): Payer: Self-pay

## 2018-04-30 ENCOUNTER — Inpatient Hospital Stay (HOSPITAL_COMMUNITY): Payer: No Typology Code available for payment source

## 2018-04-30 DIAGNOSIS — D62 Acute posthemorrhagic anemia: Secondary | ICD-10-CM

## 2018-04-30 DIAGNOSIS — Q059 Spina bifida, unspecified: Secondary | ICD-10-CM

## 2018-04-30 DIAGNOSIS — S82851S Displaced trimalleolar fracture of right lower leg, sequela: Secondary | ICD-10-CM

## 2018-04-30 LAB — CBC WITH DIFFERENTIAL/PLATELET
Abs Immature Granulocytes: 0 10*3/uL (ref 0.0–0.1)
Basophils Absolute: 0 10*3/uL (ref 0.0–0.1)
Basophils Relative: 1 %
EOS PCT: 4 %
Eosinophils Absolute: 0.3 10*3/uL (ref 0.0–0.7)
HEMATOCRIT: 37.3 % (ref 36.0–46.0)
HEMOGLOBIN: 11.8 g/dL — AB (ref 12.0–15.0)
IMMATURE GRANULOCYTES: 0 %
LYMPHS PCT: 34 %
Lymphs Abs: 2.3 10*3/uL (ref 0.7–4.0)
MCH: 29.2 pg (ref 26.0–34.0)
MCHC: 31.6 g/dL (ref 30.0–36.0)
MCV: 92.3 fL (ref 78.0–100.0)
Monocytes Absolute: 0.7 10*3/uL (ref 0.1–1.0)
Monocytes Relative: 10 %
Neutro Abs: 3.5 10*3/uL (ref 1.7–7.7)
Neutrophils Relative %: 51 %
Platelets: 363 10*3/uL (ref 150–400)
RBC: 4.04 MIL/uL (ref 3.87–5.11)
RDW: 13.3 % (ref 11.5–15.5)
WBC: 6.8 10*3/uL (ref 4.0–10.5)

## 2018-04-30 LAB — COMPREHENSIVE METABOLIC PANEL
ALBUMIN: 2.9 g/dL — AB (ref 3.5–5.0)
ALK PHOS: 94 U/L (ref 38–126)
ALT: 21 U/L (ref 14–54)
AST: 18 U/L (ref 15–41)
Anion gap: 11 (ref 5–15)
BILIRUBIN TOTAL: 0.6 mg/dL (ref 0.3–1.2)
BUN: 13 mg/dL (ref 6–20)
CALCIUM: 8.7 mg/dL — AB (ref 8.9–10.3)
CO2: 24 mmol/L (ref 22–32)
Chloride: 100 mmol/L — ABNORMAL LOW (ref 101–111)
Creatinine, Ser: 0.72 mg/dL (ref 0.44–1.00)
GFR calc Af Amer: >60 mL/min (ref >60)
GFR calc non Af Amer: >60 mL/min (ref >60)
GLUCOSE: 88 mg/dL (ref 65–99)
Potassium: 4.2 mmol/L (ref 3.5–5.1)
Sodium: 135 mmol/L (ref 135–145)
TOTAL PROTEIN: 6.1 g/dL — AB (ref 6.5–8.1)

## 2018-04-30 NOTE — Progress Notes (Signed)
Occupational Therapy Session Note  Patient Details  Name: ANISA LEANOS MRN: [id number] Date of Birth: 04/25/1971  Today's Date: 04/30/2018 OT Individual Time: 1445-1555 OT Individual Time Calculation (min): 70 min    Short Term Goals: Week 1:  OT Short Term Goal 1 (Week 1): STG=LTGs d/t ELOS  Skilled Therapeutic Interventions/Progress Updates:  1;1. Pt seated in w/c upon arrival with no c/o pain. Pt requesting to toilet with MIN A lateral scoot transfer w/c<>BSC with Vc for hand and foot placement to [facility]. Pt able to lateral lean into w/c and standard chair to advance pants past hips with min A. Pt able to void bladder and complete peri care seated. Pt unable to advance pants past hips with lateral lean and completes w/c push up through BUE and LLE for OT to advance pants up hips. In tx gym, pt completes sit to stand with RW and stand pivot transfer with min-MOD A for balance while pivoting. OT able to find wider [facility] and pt practices transfer to BSC/clothing management in standing with min A for balance. At this time pt prefers lateral scoot to strong (L) side and stand pivot transfer to weak (R) side with RW. Exited session with pt seated in bed, call light in reach and all needs met Therapy Documentation Precautions:  Precautions Precautions: Fall Precaution Comments: s/p ankle ORIF Restrictions Weight Bearing Restrictions: Yes RLE Weight Bearing: Non weight bearing  See Function Navigator for Current Functional Status.   Therapy/Group: Individual Therapy  Tonny Branch 04/30/2018, 4:04 PM

## 2018-04-30 NOTE — Progress Notes (Signed)
PHYSICAL MEDICINE & REHABILITATION     PROGRESS NOTE    Subjective/Complaints: Had a good night. Pain seems controlled. Moving bowels and bladder per norm  ROS: Patient denies fever, rash, sore throat, blurred vision, nausea, vomiting, diarrhea, cough, shortness of breath or chest pain, joint or back pain, headache, or mood change.   Objective:  No results found. Recent Labs    04/29/18 1442 04/30/18 0625  WBC 7.0 6.8  HGB 12.6 11.8*  HCT 38.9 37.3  PLT 416* 363   Recent Labs    04/29/18 1442 04/30/18 0625  NA  --  135  K  --  4.2  CL  --  100*  GLUCOSE  --  88  BUN  --  13  CREATININE 0.79 0.72  CALCIUM  --  8.7*   CBG (last 3)  No results for input(s): GLUCAP in the last 72 hours.  Wt Readings from Last 3 Encounters:  04/29/18 121.6 kg (268 lb 1.3 oz)  04/23/18 123.1 kg (271 lb 5 oz)  04/12/18 122.5 kg (270 lb)     Intake/Output Summary (Last 24 hours) at 04/30/2018 0841 Last data filed at 04/30/2018 1610 Gross per 24 hour  Intake 480 ml  Output 150 ml  Net 330 ml    Vital Signs: Blood pressure 122/71, pulse 72, temperature (!) 97.4 F (36.3 C), temperature source Oral, resp. rate 16, height 5' (1.524 m), weight 121.6 kg (268 lb 1.3 oz), last menstrual period 04/08/2018, SpO2 97 %. Physical Exam:  Constitutional: No distress . Vital signs reviewed. obese HEENT: EOMI, oral membranes moist Neck: supple Cardiovascular: RRR without murmur. No JVD    Respiratory: CTA Bilaterally without wheezes or rales. Normal effort    GI: BS +, non-tender, non-distended  Neurological: She isalertand oriented to person, place, and time. 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 (Large wrap) Motor strength is 5/5 UE, LLE 4-5/5. RLE 3/5 HF, can wiggle toes. Decreased sensation 1st/2nd  toes.webspace Psych: pleasant and cooperative   Assessment/Plan: 1. Impaired functional mobility  secondary to right trimalleolar ankle fracture which require 3+ hours per day of interdisciplinary therapy in a comprehensive inpatient rehab setting. Physiatrist is providing close team supervision and 24 hour management of active medical problems listed below. Physiatrist and rehab team continue to assess barriers to discharge/monitor patient progress toward functional and medical goals.  Function:  Bathing Bathing position Bathing activity did not occur: N/A    Bathing parts      Bathing assist        Upper Body Dressing/Undressing Upper body dressing Upper body dressing/undressing activity did not occur: N/A                  Upper body assist        Lower Body Dressing/Undressing Lower body dressing Lower body dressing/undressing activity did not occur: N/A                                Lower body assist        Toileting Toileting Toileting activity did not occur: N/A        Toileting assist     Transfers Chair/bed transfer Chair/bed transfer activity did not occur: N/A           Locomotion Ambulation  Wheelchair          Cognition Comprehension Comprehension assist level: Understands basic 90% of the time/cues < 10% of the time  Expression Expression assist level: Expresses basic 90% of the time/requires cueing < 10% of the time.  Social Interaction Social Interaction assist level: Interacts appropriately 90% of the time - Needs monitoring or encouragement for participation or interaction.  Problem Solving Problem solving assist level: Solves complex 90% of the time/cues < 10% of the time  Memory Memory assist level: Complete Independence: No helper   Medical Problem List and Plan: 1.Decreased functional mobilitysecondary to right trimalleolar ankle fracture status post ORIF 04/23/2018. Hx of spina bifida  Nonweightbearing.  -beginning therapies today 2. DVT Prophylaxis/Anticoagulation: Subcutaneous Lovenox.   vascular study pending 3. Pain Management:Cymbalta 30 mg nightly, hydrocodone Robaxin and Advil as needed. 4. Mood:Provide emotional support 5. Neuropsych: This patientiscapable of making decisions on herown behalf. 6. Skin/Wound Care:Routine skin checks. No visible drainage through splint 7. Fluids/Electrolytes/Nutrition:encourage PO  -I personally reviewed all of the patient's labs today, and lab work is within normal limits. 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 Patient did use electric scooter for long distances. 10.Constipation. Laxative assistance 11.Obesity. BMI 52.99. Follow-up dietary 12. ABLA: hgb sl decreased to 11.8 today   -continue to encourage adequate nutrition  -follow up next week    LOS (Days) 1 A FACE TO FACE EVALUATION WAS PERFORMED  Ranelle OysterZachary T Swartz, MD 04/30/2018 8:41 AM

## 2018-04-30 NOTE — Progress Notes (Signed)
Inpatient Rehabilitation Center Individual Statement of Services  Patient Name:  [name]  Date:  04/30/2018  Welcome to the Inpatient Rehabilitation Center.  Our goal is to provide you with an individualized program based on your diagnosis and situation, designed to meet your specific needs.  With this comprehensive rehabilitation program, you will be expected to participate in at least 3 hours of rehabilitation therapies Monday-Friday, with modified therapy programming on the weekends.  Your rehabilitation program will include the following services:  Physical Therapy (PT), Occupational Therapy (OT), 24 hour per day rehabilitation nursing, Case Management (Social Worker), Rehabilitation Medicine, Nutrition Services and Pharmacy Services  Weekly team conferences will be held on Tuesdays to discuss your progress.  Your Social Worker will talk with you frequently to get your input and to update you on team discussions.  Team conferences with you and your family in attendance may also be held.  Expected length of stay:  7 to 10 days  Overall anticipated outcome:  Modified independent with supervision for car transfers and wheelchair propulsion  Depending on your progress and recovery, your program may change. Your Social Worker will coordinate services and will keep you informed of any changes. Your Social Worker's name and contact numbers are listed  below.  The following services may also be recommended but are not provided by the Inpatient Rehabilitation Center:   Driving Evaluations  Home Health Rehabiltiation Services  Outpatient Rehabilitation Services  Vocational Rehabilitation   Arrangements will be made to provide these services after discharge if needed.  Arrangements include referral to agencies that provide these services.  Your insurance has been verified to be:  Workers' Psychiatrist primary doctor is:  You do not currently have one, but you are open to [place]  assisting with calling your husband's [facility] to see if they would accept you as a new patient.  Pertinent information will be shared with your doctor and your insurance company.  Social Worker:  Staci Acosta, LCSW  [phone] or [phone]  Information discussed with and copy given to patient by: Elvera Lennox, 04/30/2018, 2:49 PM

## 2018-04-30 NOTE — IPOC Note (Signed)
Overall Plan of Care [facility]) Patient Details Name: [name] MRN: [id number] DOB: [birth year]  Admitting Diagnosis: <principal problem not specified>trimalleolar ankle fx  Hospital Problems: Active Problems:   Trimalleolar fracture of ankle, closed, left, initial encounter     Functional Problem List: Nursing Edema, Endurance, Medication Management, Pain, Safety, Skin Integrity  PT Balance, Endurance, Motor, Sensory  OT Balance, Edema, Endurance, Sensory  SLP    TR         Basic ADL's: OT Grooming, Bathing, Dressing, Toileting     Advanced  ADL's: OT Simple Meal Preparation     Transfers: PT Bed Mobility, Bed to Chair, Car, Occupational psychologist, Research scientist (life sciences): PT Psychologist, prison and probation services, Ambulation     Additional Impairments: OT    SLP        TR      Anticipated Outcomes Item Anticipated Outcome  Self Feeding no goal  Swallowing      Basic self-care  MOD I  Toileting  MOD I   Bathroom Transfers MOD I; S shower  Bowel/Bladder  Supervision  Transfers  supervision<>mod I  Locomotion  supervision in manual w/c in CIR, pt planning to use motorized scooter upon d/c  Communication     Cognition     Pain  <3 on a 0-10 pain scale  Safety/Judgment  min assist with ambulation and OOB   Therapy Plan: PT Intensity: Minimum of 1-2 x/day ,45 to 90 minutes PT Frequency: 5 out of 7 days PT Duration Estimated Length of Stay: 7-10 days OT Intensity: Minimum of 1-2 x/day, 45 to 90 minutes OT Frequency: 5 out of 7 days OT Duration/Estimated Length of Stay: 7-10      Team Interventions: Nursing Interventions Patient/Family Education, Pain Management, Medication Management, Skin Care/Wound Management, Discharge Planning, Psychosocial Support  PT interventions Ambulation/gait training, Discharge planning, DME/adaptive equipment instruction, Functional mobility training, Psychosocial support, Therapeutic Activities, UE/LE Strength taining/ROM,  Wheelchair propulsion/positioning, UE/LE Coordination activities, Therapeutic Exercise, Skin care/wound management, Patient/family education, Neuromuscular re-education, Disease management/prevention, Firefighter, Warden/ranger  OT Interventions Warden/ranger, Discharge planning, Pain management, Self Care/advanced ADL retraining, Therapeutic Activities, UE/LE Coordination activities, Disease mangement/prevention, Functional mobility training, Patient/family education, Therapeutic Exercise, Community reintegration, Fish farm manager, Psychosocial support, Splinting/orthotics, UE/LE Strength taining/ROM, Wheelchair propulsion/positioning  SLP Interventions    TR Interventions    SW/CM Interventions Discharge Planning, Psychosocial Support, Patient/Family Education   Barriers to Discharge MD  Medical stability  Nursing Decreased caregiver support, Weight bearing restrictions    PT Weight bearing restrictions NWB RLE  OT      SLP      SW       Team Discharge Planning: Destination: PT-Home ,OT- Home , SLP-  Projected Follow-up: PT-Home health PT, OT-  Home health OT, SLP-  Projected Equipment Needs: PT-To be determined, OT- Tub/shower bench, Other (comment), SLP-  Equipment Details: PT- , OT-DAC Patient/family involved in discharge planning: PT- Patient,  OT-Patient, SLP-   MD ELOS: 7-10 days Medical Rehab Prognosis:  Excellent Assessment: The patient has been admitted for CIR therapies with the diagnosis of right trimalleolar fx in setting of spina bifida. The team will be addressing functional mobility, strength, stamina, balance, safety, adaptive techniques and equipment, self-care, bowel and bladder mgt, patient and caregiver education, NMR, ortho precautions, pain control, community reentry. Goals have been set at mod I to supervision with basic self=care and ADL's, and mod I to supervision with mobility and transfers.     [name].  Naaman Plummer, MD, [facility]      See Team Conference Notes for weekly updates to the plan of care

## 2018-04-30 NOTE — Evaluation (Signed)
Physical Therapy Assessment and Plan  Patient Details  Name: [name] MRN: [id number] Date of Birth: 1971-03-28  PT Diagnosis: Abnormality of gait, Difficulty walking and Muscle weakness Rehab Potential: Excellent ELOS: 7-10 days   Today's Date: 04/30/2018 PT Individual Time: 0807-0907 PT Individual Time Calculation (min): 60 min    Problem List:  Patient Active Problem List   Diagnosis Date Noted  . Trimalleolar fracture of ankle, closed, left, initial encounter 04/29/2018  . Closed displaced trimalleolar fracture of right ankle 04/23/2018    Past Medical History:  Past Medical History:  Diagnosis Date  . 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 GERD (gastroesophageal reflux disease)   . Hydrocephalus   . Malignant hyperthermia   . Osteoarthritis   . Spina bifida [facility])    Past Surgical History:  Past Surgical History:  Procedure Laterality Date  . ANKLE ARTHROSCOPY Left    related to Tendon  . BACK SURGERY  1971   Spinal Bifida  . KNEE ARTHROSCOPY Left 1983  . ORIF ANKLE FRACTURE Right 04/23/2018   Procedure: OPEN REDUCTION INTERNAL FIXATION (ORIF) TRIMALLEOLAR ANKLE FRACTURE;  Surgeon: Nicholes Stairs, MD;  Location: [facility];  Service: Orthopedics;  Laterality: Right;  120 mins  . SHUNT EXTERNALIZATION     Head x 2    Assessment & Plan Clinical Impression: Patient is a 47 year old right-handed female with history ofobesity,Chiari malformation, hydrocephalus with shunting 2006by Dr. Glenna Fellows, spina bifida with back surgeryin 2012 at the [place] receiving inpatient rehab services.. Per chart review patient lives with spouse. Patient still works. Independent with self-care. She would require an electric scooter for longer distances. One level home with ramped entrance. Family plans to assist as needed. Presented 04/23/2018 after recent fall approximate 1 week ago while at work injuring her right ankle. Follow-up outpatient with  orthopedic services Dr. Stann Mainland after x-rays revealed right ankle trimalleolar fracture. Underwent ORIF 04/23/2018 per Dr. Stann Mainland. Hospital course pain management. Nonweightbearing right lower extremity. Subcutaneous Lovenox for DVT prophylaxis. On physical and occupational therapy evaluations completed with recommendations of physical medicine rehab consult. Patient was admitted for a comprehensive rehab program.  Patient transferred to CIR on 04/29/2018 .   Patient currently requires min with mobility secondary to muscle weakness, decreased cardiorespiratoy endurance, and decreased sitting balance and difficulty maintaining precautions.  Prior to hospitalization, patient was modified independent  with mobility and lived with Spouse, Son(30 y/o son) in a House home.  Home access is  Ramped entrance.  Patient will benefit from skilled PT intervention to maximize safe functional mobility, minimize fall risk and decrease caregiver burden for planned discharge home with intermittent assist.  Anticipate patient will benefit from follow up [facility] at discharge.  PT - End of Session Activity Tolerance: Decreased this session Endurance Deficit: Yes Endurance Deficit Description: (2/2 generalized weakness) PT Assessment Rehab Potential (ACUTE/IP ONLY): Excellent PT Barriers to Discharge: Weight bearing restrictions PT Barriers to Discharge Comments: NWB RLE PT Patient demonstrates impairments in the following area(s): Balance;Endurance;Motor;Sensory PT Transfers Functional Problem(s): Bed Mobility;Bed to Chair;Car;Furniture PT Locomotion Functional Problem(s): Wheelchair Mobility;Ambulation PT Plan PT Intensity: Minimum of 1-2 x/day ,45 to 90 minutes PT Frequency: 5 out of 7 days PT Duration Estimated Length of Stay: 7-10 days PT Treatment/Interventions: Ambulation/gait training;Discharge planning;DME/adaptive equipment instruction;Functional mobility training;Psychosocial support;Therapeutic  Activities;UE/LE Strength taining/ROM;Wheelchair propulsion/positioning;UE/LE Coordination activities;Therapeutic Exercise;Skin care/wound management;Patient/family education;Neuromuscular re-education;Disease management/prevention;Community reintegration;Balance/vestibular training PT Transfers Anticipated Outcome(s): supervision<>mod I PT Locomotion Anticipated Outcome(s): supervision in manual w/c in CIR,  pt planning to use motorized scooter upon d/c PT Recommendation Recommendations for Other Services: Therapeutic Recreation consult Therapeutic Recreation Interventions: Pet therapy;Outing/community reintergration;Kitchen group;Stress management Follow Up Recommendations: Home health PT Patient destination: Home Equipment Recommended: To be determined  Skilled Therapeutic Intervention Pt received in bed & agreeable to tx. PT evaluation initiated with therapist educating pt on ELOS, daily therapy schedule, weekly interdisciplinary team meeting, safety plan/precautions, NWB RLE, and other various CIR Information. Spent significant time discussing pt's PLOF & goals for therapy. Pt reports she ambulated short, household distances at baseline with a 3 wheeled walker, and has 2 scooters she can use in the home but primarily used for long distance, community mobility. Pt reports she has a grab bar in her bathroom and is able to stand pivot to toilet. Pt's main goal is to be able to perform clothing management and toileting without assistance, as her 33 y/o son is the only one home during the day with her (he will be out of school for the summer in 1 week but her husband, [name], has no additional time off from work he can use). Pt reports her BLE have given out on her in the past and she will fall but this only happens when she is very fatigued. Educated pt on transfer level goals and recommendation of f/u HHPT then progress to OPPT once weight bearing restrictions are lifted. Encouraged pt to have husband  bring in her walker & scooter & she reports he can do this. Provided pt with 22x18 w/c for increased comfort and positioning to increase OOB tolerance and functional mobility from w/c level. Pt completes multiple lateral scoots bed<>w/c with min assist to support RLE and instruction/demonstration regarding transfer. Pt demonstrates good safety awareness overall. At end of session pt left sitting in w/c with all needs within reach.   PT Evaluation Precautions/Restrictions Precautions Precautions: Fall Precaution Comments: s/p ankle ORIF Restrictions Weight Bearing Restrictions: Yes RLE Weight Bearing: Non weight bearing  General Chart Reviewed: Yes Additional Pertinent History: spina bifida, OA, malignant hyperthermia, hydrocephalus Response to Previous Treatment: Not applicable Family/Caregiver Present: No  Pain Pain Assessment Denies c/o pain  Home Living/Prior Functioning Home Living Available Help at Discharge: Family;Available 24 hours/day Type of Home: House Home Access: Ramped entrance Home Layout: One level Additional Comments: grab bar by toilet  Lives With: Spouse;Son(17 y/o son) Prior Function Level of Independence: Independent with basic ADLs;Independent with homemaking with ambulation;Independent with gait;Independent with transfers;Requires assistive device for independence(used 3 wheeled walker for short distance/househould gait, motorized scooter for long distance ambulation) Driving: Yes Vocation: Full time employment 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 No changes in vision reported.   Cognition Overall Cognitive Status: Within Functional Limits for tasks assessed Arousal/Alertness: Awake/alert Memory: Appears intact Awareness: Appears intact Safety/Judgment: Appears intact  Sensation Sensation Light Touch: (pt reports impaired sensation at baseline 2/2 spina  bifida)  Motor  Motor Motor - Skilled Clinical Observations: generalized weakness   Mobility Bed Mobility Bed Mobility: Supine to Sit Supine to Sit: 5: Supervision;HOB elevated;With rails Transfers Transfers: Yes Lateral/Scoot Transfers: 4: Min assist Lateral/Scoot Transfer Details: Manual facilitation for weight bearing;Verbal cues for sequencing;Verbal cues for precautions/safety;Verbal cues for technique  Locomotion  Ambulation Ambulation: No Gait Gait: No Stairs / Additional Locomotion Stairs: No Wheelchair Mobility Wheelchair Mobility: No   Trunk/Postural Assessment  Hx of spina bifida & major back surgery in 2012  Balance Balance Balance Assessed: Yes Dynamic  Sitting Balance Dynamic Sitting - Balance Support: (LLE & BUE supported) Dynamic Sitting - Level of Assistance: 4: Min assist Dynamic Sitting - Balance Activities: (during transfer bed<>w/c)  Extremity Assessment  RUE Assessment RUE Assessment: Within Functional Limits LUE Assessment LUE Assessment: Within Functional Limits RLE Assessment RLE Assessment: (splint on RLE therefore ROM restricted, NWB RLE) LLE Assessment LLE Assessment: Within Functional Limits   See Function Navigator for Current Functional Status.   Refer to Care Plan for Long Term Goals  Recommendations for other services: Therapeutic Recreation  Pet therapy, Kitchen group, Stress management and Outing/community reintegration  Discharge Criteria: Patient will be discharged from PT if patient refuses treatment 3 consecutive times without medical reason, if treatment goals not met, if there is a change in medical status, if patient makes no progress towards goals or if patient is discharged from hospital.  The above assessment, treatment plan, treatment alternatives and goals were discussed and mutually agreed upon: by patient  Waunita Schooner 04/30/2018, 9:58 AM

## 2018-04-30 NOTE — Progress Notes (Signed)
Social Work Assessment and Plan  Patient Details  Name: [name] MRN: [id number] Date of Birth: 06-Mar-1971  Today's Date: 04/30/2018  Problem List:  Patient Active Problem List   Diagnosis Date Noted  . Trimalleolar fracture of ankle, closed, left, initial encounter 04/29/2018  . Closed displaced trimalleolar fracture of right ankle 04/23/2018   Past Medical History:  Past Medical History:  Diagnosis Date  . 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 GERD (gastroesophageal reflux disease)   . Hydrocephalus   . Malignant hyperthermia   . Osteoarthritis   . Spina bifida [facility])    Past Surgical History:  Past Surgical History:  Procedure Laterality Date  . ANKLE ARTHROSCOPY Left    related to Tendon  . BACK SURGERY  1971   Spinal Bifida  . KNEE ARTHROSCOPY Left 1983  . ORIF ANKLE FRACTURE Right 04/23/2018   Procedure: OPEN REDUCTION INTERNAL FIXATION (ORIF) TRIMALLEOLAR ANKLE FRACTURE;  Surgeon: Nicholes Stairs, MD;  Location: [facility];  Service: Orthopedics;  Laterality: Right;  120 mins  . SHUNT EXTERNALIZATION     Head x 2   Social History:  reports that she has never smoked. She has never used smokeless tobacco. She reports that she does not drink alcohol or use drugs.  Family / Support Systems Marital Status: Married How Long?: will be 20 years in September Patient Roles: Spouse, Parent, Other (Comment)(dtr; sister; employee) Spouse/Significant Other: Brunella Wileman - husband - [phone] - home; [phone] - mobile Children: 86 y/o son in the home ([place]); 69 y/o son lives 3 miles away and is a Emergency planning/management officer in [place] ([place]) Other Supports: extended family; co-workers Anticipated Caregiver: self and husband Ability/Limitations of Caregiver: Husband works Careers adviser: Intermittent Family Dynamics: close, supportive relationships  Social History Preferred language: English Religion: Baptist Read: Yes Write: Yes Employment Status:  Employed Name of 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 Legal History/Current Legal Issues: none reported, but this is a Workers' Compensation case since pt fell while at work Guardian/Conservator: N/A - MD has determined that pt is capable of making her own decisions.   Abuse/Neglect Abuse/Neglect Assessment Can Be Completed: Yes Physical Abuse: Denies Verbal Abuse: Denies Sexual Abuse: Denies Exploitation of patient/patient's resources: Denies Self-Neglect: Denies  Emotional Status Pt's affect, behavior and adjustment status: Pt is very positive and upbeat about her rehabilitation.  She is motivated to get better and has done intensive inpt rehab in 2012 in [place], so she is happy to be closer to home this time.  Pt enjoys therapy. Recent Psychosocial Issues: Pt with Spina Bifida which has provided struggles throughout her life, but that she copes with well.  She has a good support system and strong mindset. Psychiatric History: none reported Substance Abuse History: none reported  Patient / Family Perceptions, Expectations & Goals Pt/Family understanding of illness & functional limitations: Pt reports a good understanding of her current condition and of her chronic conditions. Premorbid pt/family roles/activities: Pt works full time and enjoys spending time with her family. Anticipated changes in roles/activities/participation: Pt hopes to return to work when she is able and be able to do her household chores and spend time with family. Pt/family expectations/goals: Pt wants to strengthen her good leg and to learn to do her transfers independently so she can take care of her personal care needs.  Community Resources [employer]:  None Premorbid Home Care/DME Agencies: Other (Comment)(Pt has a knee scooter, wide bedside commode, walker, and two scooters - new one  is smaller and moves throughtout the home easier.  Agency unknown.) Transportation available at discharge: husband  He also plans to drive pt to and from work so that she can use her scooter at work.  Discharge Planning Living Arrangements: Spouse/significant other, Children Support Systems: Spouse/significant other, Children, Parent, Other relatives, Friends/neighbors, Other (Comment)(Dr. Glenna Fellows - neurosurgeon) Type of Residence: Private residence Insurance Resources: Multimedia programmer (specify)(Workers' Compensation; Pt has Theme park manager through work, as well) Pensions consultant: Employment, Family Support(husband is working, as well) Museum/gallery curator Screen Referred: No Money Management: Patient, Spouse(Pt also manages her mother's finances.  She is in a nursing facility.) Does the patient have any problems obtaining your medications?: No Home Management: Pt and husband share this, but husband is used to doing this since pt fell and at various times when she has needed more help. Patient/Family Preliminary Plans: Pt plans to return to her home with her family assisting intermittently, but wishes to be mainly modified independent. Social Work Anticipated Follow Up Needs: HH/OP Expected length of stay: 7-10 days  Clinical Impression CSW met with pt to introduce self and role of CSW, as well as to complete assessment.  Pt has a very positive and motivated attitude.  She feels this is not the biggest, hardest thing she's gone through and feels confident that she will get through this as she has the other challenges.  She's grateful to be near home for her rehabilitation this time, as she was in [place] for over a month in 2012.  Pt has good family support and is motivated to get well to be with them and to return to work.  No current concerns/needs/questions at this time.  CSW will continue to follow and assist as needed.  Marixa Mellott, Silvestre Mesi 04/30/2018, 3:16 PM

## 2018-04-30 NOTE — Evaluation (Signed)
Occupational Therapy Assessment and Plan  Patient Details  Name: [name] MRN: [id number] Date of Birth: Apr 16, 1971  OT Diagnosis: muscle weakness (generalized) and swelling of limb Rehab Potential:   ELOS: 7-10   Today's Date: 04/30/2018 OT Individual Time: 1100-1200 OT Individual Time Calculation (min): 60 min     Problem List:  Patient Active Problem List   Diagnosis Date Noted  . Trimalleolar fracture of ankle, closed, left, initial encounter 04/29/2018  . Closed displaced trimalleolar fracture of right ankle 04/23/2018    Past Medical History:  Past Medical History:  Diagnosis Date  . 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 GERD (gastroesophageal reflux disease)   . Hydrocephalus   . Malignant hyperthermia   . Osteoarthritis   . Spina bifida [facility])    Past Surgical History:  Past Surgical History:  Procedure Laterality Date  . ANKLE ARTHROSCOPY Left    related to Tendon  . BACK SURGERY  1971   Spinal Bifida  . KNEE ARTHROSCOPY Left 1983  . ORIF ANKLE FRACTURE Right 04/23/2018   Procedure: OPEN REDUCTION INTERNAL FIXATION (ORIF) TRIMALLEOLAR ANKLE FRACTURE;  Surgeon: Nicholes Stairs, MD;  Location: [facility];  Service: Orthopedics;  Laterality: Right;  120 mins  . SHUNT EXTERNALIZATION     Head x 2    Assessment & Plan Clinical Impression: [name] is a 47 year old right-handed female with history of obesity, Chiari malformation, hydrocephalus with shunting 2006 by Dr. Glenna Fellows, spina bifida with back surgery in 2012 at the [facility] receiving inpatient rehab services..  Per chart review patient lives with spouse.  Patient still works.  Independent with self-care.  She would require an electric scooter for longer distances.  One level home with ramped entrance.  Family plans to assist as needed.  Presented 04/23/2018 after recent fall approximate 1 week ago while at work injuring her right ankle.  Follow-up outpatient with orthopedic  services Dr. Stann Mainland after x-rays revealed right ankle trimalleolar fracture.  Underwent ORIF 04/23/2018 per Dr. Stann Mainland.  Hospital course pain management.  Nonweightbearing right lower extremity.  Subcutaneous Lovenox for DVT prophylaxis.  On physical and occupational therapy evaluations completed with recommendations of physical medicine rehab consult.  Patient was admitted for a comprehensive rehab program. .    Patient currently requires min- max with basic self-care skills secondary to muscle weakness, decreased cardiorespiratoy endurance and decreased standing balance, decreased postural control and decreased balance strategies.  Prior to hospitalization, patient could complete BADL/IADL with modified independent .  Patient will benefit from skilled intervention to decrease level of assist with basic self-care skills and increase independence with basic self-care skills prior to discharge home with care partner.  Anticipate patient will require intermittent supervision  and follow up home health.  OT - End of Session Activity Tolerance: Tolerates 30+ min activity with multiple rests Endurance Deficit: Yes Endurance Deficit Description: (generalized weakness) OT Assessment Rehab Potential (ACUTE ONLY): Good OT Patient demonstrates impairments in the following area(s): Balance;Edema;Endurance;Sensory OT Basic ADL's Functional Problem(s): Grooming;Bathing;Dressing;Toileting OT Advanced ADL's Functional Problem(s): Simple Meal Preparation OT Transfers Functional Problem(s): Toilet;Tub/Shower OT Plan OT Intensity: Minimum of 1-2 x/day, 45 to 90 minutes OT Frequency: 5 out of 7 days OT Duration/Estimated Length of Stay: 7-10 OT Treatment/Interventions: Balance/vestibular training;Discharge planning;Pain management;Self Care/advanced ADL retraining;Therapeutic Activities;UE/LE Coordination activities;Disease mangement/prevention;Functional mobility training;Patient/family education;Therapeutic  Exercise;Community reintegration;DME/adaptive equipment instruction;Psychosocial support;Splinting/orthotics;UE/LE Strength taining/ROM;Wheelchair propulsion/positioning OT Self Feeding Anticipated Outcome(s): no goal OT Basic Self-Care Anticipated Outcome(s): MOD I OT Toileting Anticipated  Outcome(s): MOD I OT Bathroom Transfers Anticipated Outcome(s): MOD I; S shower OT Recommendation Patient destination: Home Follow Up Recommendations: Home health OT Equipment Recommended: Tub/shower bench;Other (comment) Equipment Details: [facility]   Skilled Therapeutic Intervention 1:1. Pt seated in w/c with no c/o pain. Pt completes squat pivot transfer with min A to R with VC for hand placement. Pt bathes UB with set up and LB with VC for long handled sponge use to wash RLE. Pt able to lateral lean for OT to wash buttocks. Pt dons bra and pull over shirt with set up and pants with VC for use of reacher technique and lateral lean while OT advances pants past hips. Pt unable to don sock/shoes without AE. OT demo use of AE to don new sock and shoe. Pt still require A to stabilize shoe when pushing foot into shoe. Educted pt on AE for toileting and completing that in next session. Pt squat pivot transfer EOB>w/c as stated above. Exited session with pt seated in w/c, call light in reach and all needs met  OT Evaluation Precautions/Restrictions  Precautions Precautions: Fall Precaution Comments: s/p ankle ORIF Restrictions Weight Bearing Restrictions: Yes RLE Weight Bearing: Non weight bearing General Chart Reviewed: Yes Vital Signs  Pain Pain Assessment Pain Scale: 0-10 Pain Score: 0-No pain Home Living/Prior Functioning Home Living Family/patient expects to be discharged to:: Private residence Living Arrangements: Spouse/significant other Available Help at Discharge: Family, Available 24 hours/day Type of Home: House Home Access: Ramped entrance Home Layout: One level Bathroom Shower/Tub:  Chiropodist: Standard Additional Comments: grab bar by toilet and shower  Lives With: Spouse, Son IADL History Homemaking Responsibilities: Yes Prior Function Level of Independence: Independent with basic ADLs, Independent with homemaking with ambulation, Independent with gait, Independent with transfers, Requires assistive device for independence Driving: Yes Vocation: Full time employment 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[employer]: works, usually independent in self care, able to [employer] hold distances with walker but does require electric scooter for longer distances  ADL   Vision Baseline Vision/History: Wears glasses Wears Glasses: Reading only Patient Visual Report: No change from baseline Vision Assessment?: No apparent visual deficits Perception    WFL Praxis   WFL Cognition Overall Cognitive Status: Within Functional Limits for tasks assessed Arousal/Alertness: Awake/alert Orientation Level: Person;Place;Situation Person: Oriented Place: Oriented Situation: Oriented Year: 2019 Month: May Day of Week: Correct Memory: Appears intact Immediate Memory Recall: Sock;Blue;Bed Memory Recall: Sock;Blue;Bed Memory Recall Sock: Without Cue Memory Recall Blue: Without Cue Memory Recall Bed: Without Cue Awareness: Appears intact Safety/Judgment: Appears intact Sensation Sensation Light Touch: (reports impaired sensation in RLE) Coordination Gross Motor Movements are Fluid and Coordinated: No Fine Motor Movements are Fluid and Coordinated: No Motor  Motor Motor - Skilled Clinical Observations: generalized weakness Mobility  Bed Mobility Bed Mobility: Supine to Sit Supine to Sit: 5: Supervision;HOB elevated;With rails  Trunk/Postural Assessment  Cervical Assessment Cervical Assessment: Exceptions to WFL(head forward) Thoracic Assessment Thoracic Assessment: Exceptions to WFL(decreased ROM d/t previous  surgeries ) Lumbar Assessment Lumbar Assessment: Exceptions to WFL(posterior pelvic tilt) Postural Control Postural Control: Within Functional Limits  Balance Balance Balance Assessed: Yes Dynamic Sitting Balance Dynamic Sitting - Balance Support: (LLE & BUE supported) Dynamic Sitting - Level of Assistance: 5: Stand by assistance Dynamic Sitting - Balance Activities: (during transfer bed<>w/c) Extremity/Trunk Assessment RUE Assessment RUE Assessment: Within Functional Limits LUE Assessment LUE Assessment: Within Functional Limits   See Function Navigator for Current Functional Status.   Refer  to Care Plan for Long Term Goals  Recommendations for other services: Therapeutic Recreation  Pet therapy, Kitchen group and Outing/community reintegration   Discharge Criteria: Patient will be discharged from OT if patient refuses treatment 3 consecutive times without medical reason, if treatment goals not met, if there is a change in medical status, if patient makes no progress towards goals or if patient is discharged from hospital.  The above assessment, treatment plan, treatment alternatives and goals were discussed and mutually agreed upon: by patient  Tonny Branch 04/30/2018, 11:38 AM

## 2018-05-01 ENCOUNTER — Inpatient Hospital Stay (HOSPITAL_COMMUNITY): Payer: No Typology Code available for payment source

## 2018-05-01 ENCOUNTER — Inpatient Hospital Stay (HOSPITAL_COMMUNITY): Payer: Self-pay | Admitting: Occupational Therapy

## 2018-05-01 ENCOUNTER — Inpatient Hospital Stay (HOSPITAL_COMMUNITY): Payer: Self-pay | Admitting: Physical Therapy

## 2018-05-01 ENCOUNTER — Inpatient Hospital Stay (HOSPITAL_COMMUNITY): Payer: No Typology Code available for payment source | Admitting: Occupational Therapy

## 2018-05-01 DIAGNOSIS — Z8669 Personal history of other diseases of the nervous system and sense organs: Secondary | ICD-10-CM

## 2018-05-01 DIAGNOSIS — Q052 Lumbar spina bifida with hydrocephalus: Secondary | ICD-10-CM

## 2018-05-01 DIAGNOSIS — M7989 Other specified soft tissue disorders: Secondary | ICD-10-CM

## 2018-05-01 DIAGNOSIS — D62 Acute posthemorrhagic anemia: Secondary | ICD-10-CM

## 2018-05-01 DIAGNOSIS — E8809 Other disorders of plasma-protein metabolism, not elsewhere classified: Secondary | ICD-10-CM

## 2018-05-01 DIAGNOSIS — E46 Unspecified protein-calorie malnutrition: Secondary | ICD-10-CM

## 2018-05-01 NOTE — Progress Notes (Signed)
[place] PHYSICAL MEDICINE & REHABILITATION     PROGRESS NOTE    Subjective/Complaints: Pt slept well overnight.  She states she had a good first day of therapies yesterday.    ROS: Denies CP, SOB, N/V/D  Objective:  No results found. Recent Labs    04/29/18 1442 04/30/18 0625  WBC 7.0 6.8  HGB 12.6 11.8*  HCT 38.9 37.3  PLT 416* 363   Recent Labs    04/29/18 1442 04/30/18 0625  NA  --  135  K  --  4.2  CL  --  100*  GLUCOSE  --  88  BUN  --  13  CREATININE 0.79 0.72  CALCIUM  --  8.7*   CBG (last 3)  No results for input(s): GLUCAP in the last 72 hours.  Wt Readings from Last 3 Encounters:  04/29/18 121.6 kg (268 lb 1.3 oz)  04/23/18 123.1 kg (271 lb 5 oz)  04/12/18 122.5 kg (270 lb)     Intake/Output Summary (Last 24 hours) at 05/01/2018 0959 Last data filed at 05/01/2018 0900 Gross per 24 hour  Intake 240 ml  Output -  Net 240 ml    Vital Signs: Blood pressure 115/63, pulse 72, temperature 97.6 F (36.4 C), temperature source Oral, resp. rate 18, height 5' (1.524 m), weight 121.6 kg (268 lb 1.3 oz), last menstrual period 04/08/2018, SpO2 97 %. Physical Exam:  Constitutional: No distress . Vital signs reviewed. HENT: Normocephalic.  Atraumatic. Eyes: EOMI. No discharge. Cardiovascular: RRR. No JVD. Respiratory: CTA Bilaterally. Normal effort. GI: BS +. Non-distended. Musc: Lymphedema b/l LE. No tenderness in extremities. Neurological: She isalertand oriented Follows commands Motor: 5/5 B/l UE LLE 4/5 proximal to distal RLE 4/5 HF, KE, wiggles toes.  Sensation intact to light touch Skin: Back surgical incision not examined today Splint in place to right lower extremity.  Psych: pleasant and cooperative  Assessment/Plan: 1. Impaired functional mobility  secondary to right trimalleolar ankle fracture which require 3+ hours per day of interdisciplinary therapy in a comprehensive inpatient rehab setting. Physiatrist is providing close team  supervision and 24 hour management of active medical problems listed below. Physiatrist and rehab team continue to assess barriers to discharge/monitor patient progress toward functional and medical goals.  Function:  Bathing Bathing position Bathing activity did not occur: N/A    Bathing parts Body parts bathed by patient: Right arm, Left arm, Chest, Abdomen, Front perineal area, Right upper leg, Left upper leg, Left lower leg Body parts bathed by helper: Buttocks, Back  Bathing assist Assist Level: Touching or steadying assistance(Pt > 75%)      Upper Body Dressing/Undressing Upper body dressing Upper body dressing/undressing activity did not occur: N/A What is the patient wearing?: Pull over shirt/dress, Bra Bra - Perfomed by patient: Thread/unthread right bra strap, Thread/unthread left bra strap, Hook/unhook bra (pull down sports bra)   Pull over shirt/dress - Perfomed by patient: Thread/unthread right sleeve, Thread/unthread left sleeve, Put head through opening, Pull shirt over trunk          Upper body assist Assist Level: Set up      Lower Body Dressing/Undressing Lower body dressing Lower body dressing/undressing activity did not occur: N/A What is the patient wearing?: Pants, Socks, Shoes     Pants- Performed by patient: Thread/unthread left pants leg, Thread/unthread right pants leg((with cues for reacher)) Pants- Performed by helper: Pull pants up/down     Socks - Performed by patient: Don/doff right sock(sock aide)  Shoes - Performed by helper: Don/doff left shoe, Fasten right          Lower body assist Assist for lower body dressing: Touching or steadying assistance (Pt > 75%)      Toileting Toileting Toileting activity did not occur: N/A Toileting steps completed by patient: Performs perineal hygiene Toileting steps completed by helper: Adjust clothing prior to toileting, Adjust clothing after toileting    Toileting assist Assist level: (MAX A)    Transfers Chair/bed transfer Chair/bed transfer activity did not occur: N/A Chair/bed transfer method: Stand pivot Chair/bed transfer assist level: Moderate assist (Pt 50 - 74%/lift or lower) Chair/bed transfer assistive device: Armrests     Locomotion Ambulation Ambulation activity did not occur: Safety/medical concerns         Wheelchair Wheelchair activity did not occur: Safety/medical concerns        Cognition Comprehension Comprehension assist level: Understands basic 90% of the time/cues < 10% of the time  Expression Expression assist level: Expresses basic 90% of the time/requires cueing < 10% of the time.  Social Interaction Social Interaction assist level: Interacts appropriately 90% of the time - Needs monitoring or encouragement for participation or interaction.  Problem Solving Problem solving assist level: Solves complex 90% of the time/cues < 10% of the time  Memory Memory assist level: Complete Independence: No helper   Medical Problem List and Plan: 1.Decreased functional mobilitysecondary to right trimalleolar ankle fracture status post ORIF 04/23/2018. Hx of spina bifida. Nonweightbearing.  Cont CIR  Notes reviewed, labs reviewed 2. DVT Prophylaxis/Anticoagulation: Subcutaneous Lovenox.     Vascular study remains pending 3. Pain Management:Cymbalta 30 mg nightly, hydrocodone Robaxin and Advil as needed. 4. Mood:Provide emotional support 5. Neuropsych: This patientiscapable of making decisions on herown behalf. 6. Skin/Wound Care:Routine skin checks. No visible drainage through splint 7. 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 Patient did use electric scooter for long distances. 10.Constipation. Laxative assistance 11.Obesity. BMI 52.99. Follow-up dietary 12. ABLA:    hgb 11.8 on 5/31   -continue to encourage adequate nutrition  -follow up next week  13. Hypoalbuminemia  Supplement initiated 6/1  LOS (Days) 2 A FACE TO FACE EVALUATION WAS PERFORMED  Ankit Karis JubaAnil Patel, MD 05/01/2018 9:59 AM

## 2018-05-01 NOTE — Progress Notes (Signed)
Bilateral lower extremity venous duplex completed  Unable to image the ankle on the right due to recent surgery. There was no evidence of DVT, superficial thrombosis, or Baker's cyst bilaterally. Graybar ElectricVirginia Dimitra [place], RVS  05/01/2018 3:31 PM

## 2018-05-01 NOTE — Progress Notes (Signed)
Occupational Therapy Session Note  Patient Details  Name: Renee FreesBarbara L Lebron MRN: [id number] Date of Birth: 1971-11-05  Today's Date: 05/01/2018 OT Individual Time: [id number] OT Individual Time Calculation (min): 71 min    Short Term Goals: Week 1:  OT Short Term Goal 1 (Week 1): STG=LTGs d/t ELOS  Skilled Therapeutic Interventions/Progress Updates:    Upon entering the room, pt supine in bed with husband, Renae Fickleaul, present in room for observation. OT demonstrated lateral scoot transfer with pt onto drop arm commode chair for caregiver. He returned demonstration and provided steady assistance for pt with toilet transfers. OT checked caregiver off to assist [facility] with [facility] transfers and RN notified. Pt and caregiver with several questions regarding home set up as pt is unable to get into bed at home and has difficulty managing LB clothing. Pt will be sleeping on couch and able to perform lateral leans to remove LB clothing before transfer or OT recommended pt wear gown/dress/skirt in order to increase independence and conserve energy. Pt transferred onto motorized scooter from home with min A squat pivot and pt maintaining NWB. Pt driving scooter to ADL apartment for problem solving tub transfer. OT recommends purchase of TTB for shower tub combination. Pt able to perform stand pivot transfer with RW from scooter with min A for balance. Pt able to get B LE's into tub while seated on bench with steady assistance for balance. OT recommended pt to remove rugs/mats in bathroom to decrease fall risk. Pt returning to scooter in same manner and returned to room with husband. All needs within reach upon exiting the room.   Therapy Documentation Precautions:  Precautions Precautions: Fall Precaution Comments: s/p ankle ORIF Restrictions Weight Bearing Restrictions: Yes RLE Weight Bearing: Non weight bearing General:   Vital Signs: Therapy Vitals Temp: 97.6 F (36.4 C) Temp Source: Oral Pulse Rate:  72 Resp: 18 BP: 115/63 Patient Position (if appropriate): Lying Oxygen Therapy SpO2: 97 % Pain:   ADL:   Vision   Perception    Praxis   Exercises:   Other Treatments:    See Function Navigator for Current Functional Status.   Therapy/Group: Individual Therapy  Alen BleacherBradsher, Naliyah Neth P 05/01/2018, 9:28 AM

## 2018-05-01 NOTE — Progress Notes (Signed)
Occupational Therapy Session Note  Patient Details  Name: Renee FreesBarbara L Lumpkin MRN: [id number] Date of Birth: October 09, 1971  Today's Date: 05/01/2018 OT Individual Time: 1301-1400 OT Individual Time Calculation (min): 59 min   Short Term Goals: Week 1:  OT Short Term Goal 1 (Week 1): STG=LTGs d/t ELOS  Skilled Therapeutic Interventions/Progress Updates:    Pt greeted supine in bed, requesting to use BSC. Prior to transfer, pt completed clothing mgt via lateral leans and then completed squat-scoot transfer to drop arm BSC with steady assist. OT educated pt on hygiene completion techniques by lowering BSC arms and having her lean onto stable surfaces. Pt able to complete hygiene herself post void of bladder and simulated void of bowels. Afterwards, she transferred back to bed and completed clothing mgt with extra time. For remainder of session, pt worked on functional transfers to couch and TTB using scooter (per plan for home). Pt required steady assist, min vcs for setup and R LE NWB during these transfers. She then returned to room via scooter and transferred back to bed. LEs elevated for edema mgt. Teds today appeared to be indenting skin. Per RN consultation, Teds were removed from L LE. Pt left with all needs within reach.   Therapy Documentation Precautions:  Precautions Precautions: Fall Precaution Comments: s/p ankle ORIF Restrictions Weight Bearing Restrictions: Yes RLE Weight Bearing: Non weight bearing General: General PT Missed Treatment Reason: Unavailable (Comment)(off unit-vascular) Vital Signs: Therapy Vitals Temp: 98.3 F (36.8 C) Temp Source: Oral Pulse Rate: (!) 104 BP: 109/69 Patient Position (if appropriate): Sitting Oxygen Therapy SpO2: 92 % O2 Device: Room Air   ADL:     See Function Navigator for Current Functional Status.   Therapy/Group: Individual Therapy  Emmajane Altamura A Demiana Crumbley 05/01/2018, 4:08 PM

## 2018-05-01 NOTE — Progress Notes (Signed)
Physical Therapy Session Note  Patient Details  Name: [name] MRN: [id number] Date of Birth: 20-Jan-1971  Today's Date: 05/01/2018 PT Missed Time: 60 Minutes Missed Time Reason: Unavailable (Comment)(off unit-vascular)  Pt missed 60 min of skilled PT, off unit for vascular.   [name] K Arnette 05/01/2018, 3:49 PM

## 2018-05-01 NOTE — Plan of Care (Signed)
  Problem: RH SAFETY Goal: RH STG ADHERE TO SAFETY PRECAUTIONS W/ASSISTANCE/DEVICE Description STG Adhere to Safety Precautions With min Assistance appropriate assistive Device.  Outcome: Progressing  Maintain safety, proper footwear, call light at hand

## 2018-05-02 ENCOUNTER — Inpatient Hospital Stay (HOSPITAL_COMMUNITY): Payer: Self-pay | Admitting: Occupational Therapy

## 2018-05-02 NOTE — Progress Notes (Signed)
[facility] [facility] PHYSICAL MEDICINE & REHABILITATION     PROGRESS NOTE    Subjective/Complaints: Patient seen lying in bed this morning. She states she slept well overnight. She states she had a good day therapies yesterday. She denies complaints.  ROS: denies CP, SOB, N/V/D  Objective:  No results found. Recent Labs    04/29/18 1442 04/30/18 0625  WBC 7.0 6.8  HGB 12.6 11.8*  HCT 38.9 37.3  PLT 416* 363   Recent Labs    04/29/18 1442 04/30/18 0625  NA  --  135  K  --  4.2  CL  --  100*  GLUCOSE  --  88  BUN  --  13  CREATININE 0.79 0.72  CALCIUM  --  8.7*   CBG (last 3)  No results for input(s): GLUCAP in the last 72 hours.  Wt Readings from Last 3 Encounters:  04/29/18 121.6 kg (268 lb 1.3 oz)  04/23/18 123.1 kg (271 lb 5 oz)  04/12/18 122.5 kg (270 lb)     Intake/Output Summary (Last 24 hours) at 05/02/2018 0706 Last data filed at 05/01/2018 1845 Gross per 24 hour  Intake 360 ml  Output 200 ml  Net 160 ml    Vital Signs: Blood pressure 108/64, pulse 88, temperature 98.3 F (36.8 C), temperature source Oral, resp. rate 18, height 5' (1.524 m), weight 121.6 kg (268 lb 1.3 oz), last menstrual period 04/08/2018, SpO2 97 %. Physical Exam:  Constitutional: No distress . Vital signs reviewed. HENT: Normocephalic.  Atraumatic. Eyes: EOMI. No discharge. Cardiovascular: RRR. No JVD. Respiratory: CTA Bilaterally. Normal effort. GI: BS +. Non-distended. Musc: Lymphedema b/l LE. No tenderness in extremities. Neurological: She isalertand oriented Follows commands Motor: 5/5 B/l UE LLE 4/5 proximal to distal RLE 4/5 HF, KE, wiggles toes (stable).  Skin:  warm and dry. Splint in place to right lower extremity.  Psych: very pleasant and cooperative  Assessment/Plan: 1. Impaired functional mobility  secondary to right trimalleolar ankle fracture which require 3+ hours per day of interdisciplinary therapy in a comprehensive inpatient rehab setting. Physiatrist is  providing close team supervision and 24 hour management of active medical problems listed below. Physiatrist and rehab team continue to assess barriers to discharge/monitor patient progress toward functional and medical goals.  Function:  Bathing Bathing position Bathing activity did not occur: N/A    Bathing parts Body parts bathed by patient: Right arm, Left arm, Chest, Abdomen, Front perineal area, Right upper leg, Left upper leg, Left lower leg Body parts bathed by helper: Buttocks, Back  Bathing assist Assist Level: Touching or steadying assistance(Pt > 75%)      Upper Body Dressing/Undressing Upper body dressing Upper body dressing/undressing activity did not occur: N/A What is the patient wearing?: Pull over shirt/dress, Bra Bra - Perfomed by patient: Thread/unthread right bra strap, Thread/unthread left bra strap, Hook/unhook bra (pull down sports bra)   Pull over shirt/dress - Perfomed by patient: Thread/unthread right sleeve, Thread/unthread left sleeve, Put head through opening, Pull shirt over trunk          Upper body assist Assist Level: Set up      Lower Body Dressing/Undressing Lower body dressing Lower body dressing/undressing activity did not occur: N/A What is the patient wearing?: Pants, Socks, Shoes     Pants- Performed by patient: Thread/unthread left pants leg, Thread/unthread right pants leg((with cues for reacher)) Pants- Performed by helper: Pull pants up/down     Socks - Performed by patient: Don/doff right sock(sock aide)  Shoes - Performed by helper: Don/doff left shoe, Fasten right          Lower body assist Assist for lower body dressing: Touching or steadying assistance (Pt > 75%)      Toileting Toileting Toileting activity did not occur: N/A Toileting steps completed by patient: Adjust clothing prior to toileting, Performs perineal hygiene, Adjust clothing after toileting Toileting steps completed by helper: Adjust clothing prior to  toileting, Adjust clothing after toileting    Toileting assist Assist level: Supervision or verbal cues   Transfers Chair/bed transfer Chair/bed transfer activity did not occur: N/A Chair/bed transfer method: Stand pivot Chair/bed transfer assist level: Moderate assist (Pt 50 - 74%/lift or lower) Chair/bed transfer assistive device: Armrests     Locomotion Ambulation Ambulation activity did not occur: Safety/medical concerns         Wheelchair Wheelchair activity did not occur: Safety/medical concerns        Cognition Comprehension Comprehension assist level: Understands basic 90% of the time/cues < 10% of the time  Expression Expression assist level: Expresses basic 90% of the time/requires cueing < 10% of the time.  Social Interaction Social Interaction assist level: Interacts appropriately 90% of the time - Needs monitoring or encouragement for participation or interaction.  Problem Solving Problem solving assist level: Solves complex 90% of the time/cues < 10% of the time  Memory Memory assist level: Complete Independence: No helper   Medical Problem List and Plan: 1.Decreased functional mobilitysecondary to right trimalleolar ankle fracture status post ORIF 04/23/2018. Hx of spina bifida. Nonweightbearing.  Cont CIR 2. DVT Prophylaxis/Anticoagulation: Subcutaneous Lovenox.     Vascular study negative and limited 3. Pain Management:Cymbalta 30 mg nightly, hydrocodone Robaxin and Advil as needed. 4. Mood:Provide emotional support 5. Neuropsych: This patientiscapable of making decisions on herown behalf. 6. Skin/Wound Care:Routine skin checks. No visible drainage through splint 7. 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 Patient did use electric scooter for long distances. 10.Constipation.  Laxative assistance 11.Obesity. BMI 52.99. Follow-up dietary 12. ABLA:   hgb 11.8 on 5/31   -continue to encourage adequate nutrition  -follow up this week  13. Hypoalbuminemia  Supplement initiated 6/1  LOS (Days) 3 A FACE TO FACE EVALUATION WAS PERFORMED  Jarica Plass Karis JubaAnil Randeep Biondolillo, MD 05/02/2018 7:06 AM

## 2018-05-02 NOTE — Progress Notes (Signed)
Occupational Therapy Session Note  Patient Details  Name: Renee FreesBarbara L Baxley MRN: [id number] Date of Birth: October 30, 1971  Today's Date: 05/02/2018 OT Individual Time: 1300-1400 OT Individual Time Calculation (min): 60 min   Short Term Goals: Week 1:  OT Short Term Goal 1 (Week 1): STG=LTGs d/t ELOS  Skilled Therapeutic Interventions/Progress Updates:    Pt greeted supine in bed. ADL mets and ready for tx. Started session with reacher training for donning pants. Pt able to complete 3/3 components, utilizing AE and lateral leans EOB. Next discussed exercise-based leisure pursuits for home to maintain current functional level. Pt reported interest in exploration of yoga. Today guided pt through gentle yoga stretches EOB and supine. Focus was on improving flexibility/strength of trunk, UEs, and LEs. She used leg lifter for Lt hamstring stretches as needed. Education emphasis was placed on finding stretches/dynamic movement within pain-free range and also deep breathing. Per pt "this feels so great! And I'm sweating!" Educated pt on using electronic device for following free chair-yoga routines at home. Utilized teach back method to assess understanding after we created a "yoga playlist" on her phone. At end of session, pt completed squat-scoot transfer to w/c with steady assist and was left with all needs within reach.    Therapy Documentation Precautions:  Precautions Precautions: Fall Precaution Comments: s/p ankle ORIF Restrictions Weight Bearing Restrictions: Yes RLE Weight Bearing: Non weight bearing Vital Signs: Therapy Vitals Temp: 98.1 F (36.7 C) Pulse Rate: 90 Resp: 16 BP: 121/76 Patient Position (if appropriate): Sitting Oxygen Therapy SpO2: 99 % O2 Device: Room Air Pain: No c/o pain during session    ADL:     See Function Navigator for Current Functional Status.   Therapy/Group: Individual Therapy  Jasmen Emrich A Tarita Deshmukh 05/02/2018, 3:52 PM

## 2018-05-02 NOTE — Plan of Care (Signed)
  Problem: Consults Goal: RH GENERAL PATIENT EDUCATION Description See Patient Education module for education specifics. 05/02/2018 1116 by Marylu LundNida, Lamanda Rudder W, LPN Outcome: Progressing 05/02/2018 1107 by Marylu LundNida, Madysen Faircloth W, LPN Outcome: Progressing Goal: Skin Care Protocol Initiated - if Braden Score 18 or less Description If consults are not indicated, leave blank or document N/A 05/02/2018 1116 by Marylu LundNida, Rashi Granier W, LPN Outcome: Progressing 05/02/2018 1107 by Marylu LundNida, Raia Amico W, LPN Outcome: Progressing   Problem: RH SKIN INTEGRITY Goal: RH STG SKIN FREE OF INFECTION/BREAKDOWN Description Skin to remain free from infection and breakdown with mod assist while on rehab.  05/02/2018 1116 by Marylu LundNida, Raschelle Wisenbaker W, LPN Outcome: Progressing 05/02/2018 1107 by Marylu LundNida, Vaiden Adames W, LPN Outcome: Progressing Goal: RH STG MAINTAIN SKIN INTEGRITY WITH ASSISTANCE Description STG Maintain Skin Integrity With mod Assistance.  05/02/2018 1116 by Marylu LundNida, Destane Speas W, LPN Outcome: Progressing 05/02/2018 1107 by Marylu LundNida, Denise Washburn W, LPN Outcome: Progressing Goal: RH STG ABLE TO PERFORM INCISION/WOUND CARE W/ASSISTANCE Description STG Able To Perform Incision and Wound Care With mod Assistance.  05/02/2018 1116 by Marylu LundNida, Brooks Stotz W, LPN Outcome: Progressing 05/02/2018 1107 by Marylu LundNida, Adalynd Donahoe W, LPN Outcome: Progressing   Problem: RH SAFETY Goal: RH STG ADHERE TO SAFETY PRECAUTIONS W/ASSISTANCE/DEVICE Description STG Adhere to Safety Precautions With min Assistance appropriate assistive Device.  05/02/2018 1116 by Marylu LundNida, Ricka Westra W, LPN Outcome: Progressing 05/02/2018 1107 by Marylu LundNida, Zakariyya Helfman W, LPN Outcome: Progressing Goal: RH STG DECREASED RISK OF FALL WITH ASSISTANCE Description STG Decreased Risk of Fall With min Assistance and maintaining NWB status on the RLE.  05/02/2018 1116 by Marylu LundNida, Lindley Hiney W, LPN Outcome: Progressing 05/02/2018 1107 by Marylu LundNida, Kayan Blissett W, LPN Outcome: Progressing   Problem: RH PAIN MANAGEMENT Goal: RH STG PAIN MANAGED  AT OR BELOW PT'S PAIN GOAL Description <3 on a 0-10 pain scale  05/02/2018 1116 by Marylu LundNida, Kyleah Pensabene W, LPN Outcome: Progressing 05/02/2018 1107 by Marylu LundNida, Bernadette Gores W, LPN Outcome: Progressing   Problem: RH KNOWLEDGE DEFICIT GENERAL Goal: RH STG INCREASE KNOWLEDGE OF SELF CARE AFTER HOSPITALIZATION Description Patient will be able to demonstrate knowledge of medications, weight bearing status of RLE, and follow up care with the MD post discharge with min assist from rehab staff.  05/02/2018 1116 by Marylu LundNida, Algernon Mundie W, LPN Outcome: Progressing 05/02/2018 1107 by Marylu LundNida, Miyoko Hashimi W, LPN Outcome: Progressing

## 2018-05-03 ENCOUNTER — Inpatient Hospital Stay (HOSPITAL_COMMUNITY): Payer: Self-pay

## 2018-05-03 ENCOUNTER — Inpatient Hospital Stay (HOSPITAL_COMMUNITY): Payer: No Typology Code available for payment source

## 2018-05-03 ENCOUNTER — Inpatient Hospital Stay (HOSPITAL_COMMUNITY): Payer: Self-pay | Admitting: Physical Therapy

## 2018-05-03 NOTE — Progress Notes (Signed)
Physical Therapy Session Note  Patient Details  Name: [name] MRN: [id number] Date of Birth: 05/05/1971  Today's Date: 05/03/2018 PT Individual Time: 1000-1045 PT Individual Time Calculation (min): 45 min   Short Term Goals: Week 1:  PT Short Term Goal 1 (Week 1): STG = LTG due to short ELOS.  Skilled Therapeutic Interventions/Progress Updates:    no c/o pain but does report some discomfort with new cast.  Session focus on d/c planning, transfer training, and NMR for trunk shortening/lengthening for carryover into clothing management with ADLs.  Extensive discussion throughout session regarding d/c planning: home entry, use of scooters for mobility in/out of house, transfers, bathroom accessibility, clothing management, and car transfers.  Pt transfers L and R throughout with supervision to L and min guard to R.  Pt completes 10 reps bilaterally of retrieving cards from underneath herself focus on trunk elongation/shortening for carry over into lower body dressing and clothing management/hygiene for toileting.  2x10 reps tricep push ups for UE strengthening.  Pt returned to room at end of session and positioned upright in w/c to await next therapist.    Therapy Documentation Precautions:  Precautions Precautions: Fall Precaution Comments: s/p ankle ORIF Restrictions Weight Bearing Restrictions: Yes RLE Weight Bearing: Non weight bearing   See Function Navigator for Current Functional Status.   Therapy/Group: Individual Therapy  Stephania FragminCaitlin E Vihana Kydd 05/03/2018, 7:03 PM

## 2018-05-03 NOTE — Progress Notes (Signed)
Occupational Therapy Session Note  Patient Details  Name: Shirline FreesBarbara L Andre MRN: [id number] Date of Birth: 09-07-1971  Today's Date: 05/03/2018 OT Individual Time: 1300-1345 OT Individual Time Calculation (min): 45 min    Short Term Goals: Week 1:  OT Short Term Goal 1 (Week 1): STG=LTGs d/t ELOS  Skilled Therapeutic Interventions/Progress Updates:    OT intervention with focus on discharge planning and BSC transfers from sofa.  Pt owns BSC.  Pt will be sleeping on sofa at home 2/2 height of bed at home.  Pt practiced sofa<>BSC transfers X 3.  Pt requires min A for transfers to R and steady A for transfers to L.  Pt will be unable to access bathroom at home but would really like to be able to take a shower.  Will continue to assess feasibility at discharge. Pt remained in w/c with all needs within reach.   Therapy Documentation Precautions:  Precautions Precautions: Fall Precaution Comments: s/p ankle ORIF Restrictions Weight Bearing Restrictions: Yes RLE Weight Bearing: Non weight bearing  See Function Navigator for Current Functional Status.   Therapy/Group: Individual Therapy  [name], Oktober Glazer Chappell 05/03/2018, 1:51 PM

## 2018-05-03 NOTE — Progress Notes (Addendum)
Occupational Therapy Session Note  Patient Details  Name: [name] MRN: [id number] Date of Birth: 03-26-1971  Today's Date: 05/03/2018 OT Individual Time: 1100-1200 OT Individual Time Calculation (min): 60 min    Short Term Goals: Week 1:  OT Short Term Goal 1 (Week 1): STG=LTGs d/t ELOS  Skilled Therapeutic Interventions/Progress Updates:    Pt received sitting up in w/c excited for therapy. Session focused on ADL transfers in the home. Pt was brought down to ADL apt for time management. Extensive discussion re type of shower bench/seat for maximizing safety and independence in the home. Several recommendations given to pt, including purchase of TTB, as well as potential home modifications to increase door width to be able to access tub with scooter. Pt's husband will bring in doorway measurements for further problem solving through barriers. Pt was practiced x4 each way (R and L) couch <> BSC transfers. Pt progressed from needing min A to CGA with each subsequent transfer. Instruction given throughout re technique to increase independence with transfers. Pt returned to room, extremely excited with progress in today's session, left in w/c with all needs met.   Therapy Documentation Precautions:  Precautions Precautions: Fall Precaution Comments: s/p ankle ORIF Restrictions Weight Bearing Restrictions: Yes RLE Weight Bearing: Non weight bearing   Pain: Pain Assessment Pain Scale: 0-10 Pain Score: 0-No pain  See Function Navigator for Current Functional Status.   Therapy/Group: Individual Therapy  Curtis Sites 05/03/2018, 12:09 PM

## 2018-05-03 NOTE — Progress Notes (Signed)
Orthopedic Tech Progress Note Patient Details:  Shirline FreesBarbara L Galligan 10/21/1971 [id number]  Ortho Devices Type of Ortho Device: Ace wrap, Post (short leg) splint, Stirrup splint Ortho Device/Splint Location: RLE Ortho Device/Splint Interventions: Ordered, Application   Post Interventions Patient Tolerated: Well Instructions Provided: Care of device   Jennye MoccasinHughes, Dim Meisinger Craig 05/03/2018, 10:16 PM

## 2018-05-03 NOTE — Progress Notes (Signed)
[place] PHYSICAL MEDICINE & REHABILITATION     PROGRESS NOTE    Subjective/Complaints: No new complaints other than splint being removed for dopplers over weekend and not feeling right since then. Ankle seems to be a bit more sore also  ROS: Patient denies fever, rash, sore throat, blurred vision, nausea, vomiting, diarrhea, cough, shortness of breath or chest pain, joint or back pain, headache, or mood change.    Objective:  No results found. No results for input(s): WBC, HGB, HCT, PLT in the last 72 hours. No results for input(s): NA, K, CL, GLUCOSE, BUN, CREATININE, CALCIUM in the last 72 hours.  Invalid input(s): CO CBG (last 3)  No results for input(s): GLUCAP in the last 72 hours.  Wt Readings from Last 3 Encounters:  04/29/18 121.6 kg (268 lb 1.3 oz)  04/23/18 123.1 kg (271 lb 5 oz)  04/12/18 122.5 kg (270 lb)     Intake/Output Summary (Last 24 hours) at 05/03/2018 0903 Last data filed at 05/03/2018 0746 Gross per 24 hour  Intake 600 ml  Output 600 ml  Net 0 ml    Vital Signs: Blood pressure 133/81, pulse 91, temperature 98.1 F (36.7 C), temperature source Oral, resp. rate 17, height 5' (1.524 m), weight 121.6 kg (268 lb 1.3 oz), last menstrual period 04/08/2018, SpO2 98 %. Physical Exam:  Constitutional: No distress . Vital signs reviewed. HEENT: EOMI, oral membranes moist Neck: supple Cardiovascular: RRR without murmur. No JVD    Respiratory: CTA Bilaterally without wheezes or rales. Normal effort    GI: BS +, non-tender, non-distended  Musc: Lymphedema b/l LE. Right leg in SL splint. Leg rotated laterally, splint turned somewhat neutral to medially Neurological: She isalertand oriented Follows commands Motor: 5/5 B/l UE LLE 4/5 proximal to distal RLE 4/5 HF, KE, wiggles toes right foot  Skin:  warm and dry. right foot warm, pink Psych: very pleasant and cooperative  Assessment/Plan: 1. Impaired functional mobility  secondary to right  trimalleolar ankle fracture which require 3+ hours per day of interdisciplinary therapy in a comprehensive inpatient rehab setting. Physiatrist is providing close team supervision and 24 hour management of active medical problems listed below. Physiatrist and rehab team continue to assess barriers to discharge/monitor patient progress toward functional and medical goals.  Function:  Bathing Bathing position Bathing activity did not occur: N/A Position: Bed  Bathing parts Body parts bathed by patient: Right arm, Left arm, Chest, Abdomen, Front perineal area, Right upper leg, Left upper leg, Left lower leg, Buttocks Body parts bathed by helper: Buttocks, Back  Bathing assist Assist Level: Touching or steadying assistance(Pt > 75%)      Upper Body Dressing/Undressing Upper body dressing Upper body dressing/undressing activity did not occur: N/A What is the patient wearing?: Pull over shirt/dress, Bra Bra - Perfomed by patient: Thread/unthread right bra strap, Thread/unthread left bra strap, Hook/unhook bra (pull down sports bra)   Pull over shirt/dress - Perfomed by patient: Thread/unthread right sleeve, Thread/unthread left sleeve, Put head through opening, Pull shirt over trunk          Upper body assist Assist Level: Set up   Set up : To obtain clothing/put away  Lower Body Dressing/Undressing Lower body dressing Lower body dressing/undressing activity did not occur: N/A What is the patient wearing?: Pants     Pants- Performed by patient: Thread/unthread right pants leg, Thread/unthread left pants leg, Pull pants up/down Pants- Performed by helper: Thread/unthread right pants leg, Thread/unthread left pants leg  Socks - Performed by patient: Don/doff right sock(sock aide)     Shoes - Performed by helper: Don/doff left shoe, Fasten right          Lower body assist Assist for lower body dressing: Supervision or verbal cues, Assistive device Assistive Device Comment:  Programmer, systems activity did not occur: N/A Toileting steps completed by patient: Adjust clothing prior to toileting, Performs perineal hygiene, Adjust clothing after toileting Toileting steps completed by helper: Adjust clothing prior to toileting, Adjust clothing after toileting    Toileting assist Assist level: Supervision or verbal cues   Transfers Chair/bed transfer Chair/bed transfer activity did not occur: N/A Chair/bed transfer method: Stand pivot Chair/bed transfer assist level: Moderate assist (Pt 50 - 74%/lift or lower) Chair/bed transfer assistive device: Armrests     Locomotion Ambulation Ambulation activity did not occur: Safety/medical concerns         Wheelchair Wheelchair activity did not occur: Safety/medical concerns        Cognition Comprehension Comprehension assist level: Understands basic 90% of the time/cues < 10% of the time  Expression Expression assist level: Expresses basic 90% of the time/requires cueing < 10% of the time.  Social Interaction Social Interaction assist level: Interacts appropriately 90% of the time - Needs monitoring or encouragement for participation or interaction.  Problem Solving Problem solving assist level: Solves complex 90% of the time/cues < 10% of the time  Memory Memory assist level: Complete Independence: No helper   Medical Problem List and Plan: 1.Decreased functional mobilitysecondary to right trimalleolar ankle fracture status post ORIF 04/23/2018. Hx of spina bifida. Nonweightbearing.  Cont CIR  -check xr right ankle given removal of splint for several hours related to doppler  -have ortho tech check on fit as well. May need different padding 2. DVT Prophylaxis/Anticoagulation: Subcutaneous Lovenox.     Vascular study negative and limited 3. Pain Management:Cymbalta 30 mg nightly, hydrocodone Robaxin and Advil as needed. 4. Mood:Provide emotional support 5. Neuropsych: This  patientiscapable of making decisions on herown behalf. 6. Skin/Wound Care:Routine skin checks. No visible drainage through splint 7. 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 Patient did use electric scooter for long distances. 10.Constipation. Laxative assistance 11.Obesity. 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 Follow-up dietary 12. ABLA:   hgb 11.8 on 5/31   -continue to encourage adequate nutrition  -follow up this week      LOS (Days) 4 A FACE TO FACE EVALUATION WAS PERFORMED  Ranelle Oyster, MD 05/03/2018 9:03 AM

## 2018-05-03 NOTE — Progress Notes (Addendum)
Physical Therapy Note  Patient Details  Name: [name] MRN: [id number] Date of Birth: 1971/02/11 Today's Date: 05/03/2018  [id number], 45 min individual tx Pain: " a little bit" r ankle; premedicated  Pt awake, and enthusiatic about starting session.  Supine wth head elevated: neuromuscular re-education via multomodal cues and demo for muscle group isolation 10 x 2 L ankle pumps, L ankle eversion, modified abdominal crunches, 10 x 1 L quad sets, bil glut sets, L straight leg raises, L long arc quad knee extension, bil hip internal rotation   PT educated pt about avoiding L ankle strengthening and LLE stretching that results in inversion of the ankle, which has been a life- long safety issue due to her L ankle turning over when she is standing.  PT educated pt on side lying L for sleeping, her preferred position for sleeping.  Pt able to use 2 pillows and position herself with supervsion.  Instruction in safest supine> sitting technique.    Squat pivot to L bed> w/c, close supervision.  L foot rest raised for better fit and function.  RLe positioned on pillow on ELR.  Pt left resting in w/c with all needs at hand.    See function navigator for current status.  [name] 05/03/2018, 7:48 AM

## 2018-05-04 ENCOUNTER — Inpatient Hospital Stay (HOSPITAL_COMMUNITY): Payer: Self-pay

## 2018-05-04 ENCOUNTER — Inpatient Hospital Stay (HOSPITAL_COMMUNITY): Payer: Self-pay | Admitting: Occupational Therapy

## 2018-05-04 ENCOUNTER — Inpatient Hospital Stay (HOSPITAL_COMMUNITY): Payer: Self-pay | Admitting: Physical Therapy

## 2018-05-04 NOTE — Progress Notes (Signed)
Occupational Therapy Session Note  Patient Details  Name: Shirline FreesBarbara L Mcguinness MRN: [id number] Date of Birth: 07-11-1971  Today's Date: 05/04/2018 OT Individual Time: 0900-1000 OT Individual Time Calculation (min): 60 min    Short Term Goals: Week 1:  OT Short Term Goal 1 (Week 1): STG=LTGs d/t ELOS  Skilled Therapeutic Interventions/Progress Updates:    Pt seated in w/c upon arrival.  OT intervention with focus on sofa<>BSC transfers.  Pt performed squat pivot transfer X 5.  Pt required contact guard/steady A for transfer to R and completed transfer to L at supervision level. Continued discharge planning.  Pt will sleep on sofa at home 2/2 bed height. Pt doffs/dons pants with lateral leans.  Pt propelled w/c back to room and remained in w/c with all needs within reach.   Therapy Documentation Precautions:  Precautions Precautions: Fall Precaution Comments: s/p ankle ORIF Restrictions Weight Bearing Restrictions: Yes RLE Weight Bearing: Non weight bearing   Pain: Pain Assessment Pain Score: 2   See Function Navigator for Current Functional Status.   Therapy/Group: Individual Therapy  [name], Dolorez Jeffrey Chappell 05/04/2018, 1:03 PM

## 2018-05-04 NOTE — Progress Notes (Signed)
Occupational Therapy Session Note  Patient Details  Name: [name] MRN: [id number] Date of Birth: September 12, 1971  Today's Date: 05/04/2018 OT Individual Time: 7412-8786 OT Individual Time Calculation (min): 84 min    Short Term Goals: Week 1:  OT Short Term Goal 1 (Week 1): STG=LTGs d/t ELOS  Skilled Therapeutic Interventions/Progress Updates:    Pt greeted seated in wc and agreeable to OT. Treatment session focused on UB strengthening, wc propulsion, toileting, and transfers. Pt completed 15 mins on SciFit arm bike on level 4 with 1 rest break. Worked on B UE strengthening within wc propulsion activity. Educated pt on safety awareness within community environment and had discussion regarding PLOF and BADLs. Pt propelled wc to [employer]	[place] and educated pt on manual wc propulsion in and out of elevator. Pt demonstrated understanding, but did not ride elevator for time management. W/c propulsion back to room with multiple rest breaks as pt could only tolerate wc propulsion for ~75 feet. BSC set-up beside wc and had pt practice sit<>stand w/ RW or clothing management. Sit<>stand w/ min A and min A for balance when removing clothing by alternating UEs on RW-pt able to maintain NWB R LE. Pt then was able to hop/turn over to [facility] w/ RW and min A. Pt voided bladder and completed peri-care with set-up A. Sit<>stand from [facility] w/ RW and min A, then managed clothing again and returned to wc on R side with hop and turn w/ RW. Pt very excited about improved ability to toilet herself today. Pt left seated in wc at end of session with needs met and case worker present.   Therapy Documentation Precautions:  Precautions Precautions: Fall Precaution Comments: s/p ankle ORIF Restrictions Weight Bearing Restrictions: Yes RLE Weight Bearing: Non weight bearing Pain:   none/denies pain See Function Navigator for Current Functional Status.  Therapy/Group: Individual Therapy  Valma Cava 05/04/2018, 2:23  PM

## 2018-05-04 NOTE — Progress Notes (Signed)
Orthopedic Tech Progress Note Patient Details:  Renee FreesBarbara L Stein 1971/04/01 [id number]  Ortho Devices Type of Ortho Device: Ankle Air splint Ortho Device/Splint Location: lle Ortho Device/Splint Interventions: Application   Post Interventions Patient Tolerated: Well Instructions Provided: Care of device   Can Lucci 05/04/2018, 11:00 AM

## 2018-05-04 NOTE — Progress Notes (Signed)
Orthopedic Tech Progress Note Patient Details:  Shirline FreesBarbara L Gugel 10-29-1971 [id number]  Patient ID: Shirline FreesBarbara L Calico, female   DOB: 10-29-1971, 47 y.o.   MRN: [id number]   Nikki DomCrawford, Bernie Fobes 05/04/2018, 11:05 AM Add ortho visit

## 2018-05-04 NOTE — Progress Notes (Signed)
Physical Therapy Session Note  Patient Details  Name: Renee FreesBarbara L Connors MRN: [id number] Date of Birth: 02-Jun-1971  Today's Date: 05/04/2018 PT Individual Time: 1000-1100 PT Individual Time Calculation (min): 60 min   Short Term Goals: Week 1:  PT Short Term Goal 1 (Week 1): STG = LTG due to short ELOS.  Skilled Therapeutic Interventions/Progress Updates:   Pt indicated no complaints of pain prior to the start of her treatment session. Today's session focused on LE strengthening, transfer training, and discharge planning. PT initiated transfer training from the car<>motorized scooter and EOM<>motorized scooter. Pt reported preference to lead with L LE for better efficiency with task for proper positioning of her R LE. PT provided set up assistance of motorized scooter to meet Pt's placement preference. Pt performed car transfers with min A for lifting during squat pivot transfer for safe transition to scooter. PT then initiated transfer training from EOM<>motorized scooter. Pt performed transfers consistently with min A for lifting; however, Pt required mod A to safely lower to EOM due to LOB episode. PT educated Pt regarding the importance of locking the steering wheel prior to transfer performance for inc stability when holding on to a support surface. Pt reported inability to independently lock steering wheel due to location of locking device indicating her husband will have to provide additional assistance. Pt reported she uses her "jazzie" w/c at home which has arm rests and will allow for safer performance of transfers in home environment. Pt performed seated there-ex c 4# wt on L LE. Pt performed hip flexion and knee extension for 1 set/ 20 reps. Pt performed un-resisted R LE knee extension and hip flexion for 1 set/ 20 reps. Pt performed seated hip abduction with light blue t-band and adduction using single pillow for 1 set/ 15 reps. PT then initiated LE functional activity using t-band to simulate  LE dressing. Pt performed activity independently demonstrating her ability to perform LE dressing safely with proper trunk flexion and pelvis off loading while EOM. PT put in an order for an L LE ankle air cast for M/L support and is currently waiting to receive proper size before introducing to Pt.   Therapy Documentation Precautions:  Precautions Precautions: Fall Precaution Comments: s/p ankle ORIF Restrictions Weight Bearing Restrictions: Yes RLE Weight Bearing: Non weight bearing       See Function Navigator for Current Functional Status.   Therapy/Group: Individual Therapy  Carney LivingCara Franklin Baumbach 05/04/2018, 4:37 PM

## 2018-05-04 NOTE — Progress Notes (Signed)
[facility]IL[facility]     PROGRESS NOTE    Subjective/Complaints: Pt in bed. Denies problems. Splint fitting better.   ROS: Patient denies fever, rash, sore throat, blurred vision, nausea, vomiting, diarrhea, cough, shortness of breath or chest pain, joint or back pain, headache, or mood change.     Objective:  Dg Ankle 2 Views Right  Result Date: 05/03/2018 CLINICAL DATA:  Persistent ankle pain EXAM: RIGHT ANKLE - 2 VIEW COMPARISON:  04/23/2018 FINDINGS: Casting material is noted in place. Postsurgical changes are noted in the distal tibia and fibula similar to that seen on the prior intraoperative exam. Fracture fragments are in near anatomic alignment. The overall appearance is similar to that seen intraoperatively. No new focal abnormality is seen. IMPRESSION: Stable appearance of distal fibular and tibial fractures following fixation. Electronically Signed   By: Alcide Clever M.D.   On: 05/03/2018 15:46   No results for input(s): WBC, HGB, HCT, PLT in the last 72 hours. No results for input(s): NA, K, CL, GLUCOSE, BUN, CREATININE, CALCIUM in the last 72 hours.  Invalid input(s): CO CBG (last 3)  No results for input(s): GLUCAP in the last 72 hours.  Wt Readings from Last 3 Encounters:  04/29/18 121.6 kg (268 lb 1.3 oz)  04/23/18 123.1 kg (271 lb 5 oz)  04/12/18 122.5 kg (270 lb)    No intake or output data in the 24 hours ending 05/04/18 0903  Vital Signs: Blood pressure 98/65, pulse 76, temperature 98.2 F (36.8 C), temperature source Oral, resp. rate 17, height 5' (1.524 m), weight 121.6 kg (268 lb 1.3 oz), last menstrual period 04/08/2018, SpO2 97 %. Physical Exam:  Constitutional: No distress . Vital signs reviewed. HEENT: EOMI, oral membranes moist Neck: supple Cardiovascular: RRR without murmur. No JVD    Respiratory: CTA Bilaterally without wheezes or rales. Normal effort    GI: BS +, non-tender, non-distended  Musc: Lymphedema b/l LE.  Right leg in SL splint which appears to be fitting correctly. leg still laterally rotated Neurological: She isalertand oriented Follows commands Motor: 5/5 B/l UE LLE 4/5 proximal to distal RLE 4/5 HF, KE, wiggles toes right foot  Skin:  warm and dry. right foot remains warm, pink Psych: very pleasant and cooperative  Assessment/Plan: 1. Impaired functional mobility  secondary to right trimalleolar ankle fracture which require 3+ hours per day of interdisciplinary therapy in a comprehensive inpatient rehab setting. Physiatrist is providing close team supervision and 24 hour management of active medical problems listed below. Physiatrist and rehab team continue to assess barriers to discharge/monitor patient progress toward functional and medical goals.  Function:  Bathing Bathing position   Position: Bed  Bathing parts Body parts bathed by patient: Right arm, Left arm, Chest, Abdomen, Front perineal area, Right upper leg, Left upper leg, Left lower leg, Buttocks Body parts bathed by helper: Buttocks, Back  Bathing assist Assist Level: Touching or steadying assistance(Pt > 75%)      Upper Body Dressing/Undressing Upper body dressing   What is the patient wearing?: Pull over shirt/dress, Bra Bra - Perfomed by patient: Thread/unthread right bra strap, Thread/unthread left bra strap, Hook/unhook bra (pull down sports bra)   Pull over shirt/dress - Perfomed by patient: Thread/unthread right sleeve, Thread/unthread left sleeve, Put head through opening, Pull shirt over trunk          Upper body assist Assist Level: Set up   Set up : To obtain clothing/put away  Lower Body Dressing/Undressing Lower body  dressing   What is the patient wearing?: Pants     Pants- Performed by patient: Thread/unthread right pants leg, Thread/unthread left pants leg, Pull pants up/down Pants- Performed by helper: Thread/unthread right pants leg, Thread/unthread left pants leg     Socks -  Performed by patient: Don/doff right sock(sock aide)     Shoes - Performed by helper: Don/doff left shoe, Fasten right          Lower body assist Assist for lower body dressing: Supervision or verbal cues, Assistive device Assistive Device Comment: Chief of Staffreacher    Toileting Toileting   Toileting steps completed by patient: Adjust clothing prior to toileting, Performs perineal hygiene, Adjust clothing after toileting Toileting steps completed by helper: Adjust clothing prior to toileting, Adjust clothing after toileting    Toileting assist Assist level: Supervision or verbal cues   Transfers Chair/bed transfer   Chair/bed transfer method: Squat pivot Chair/bed transfer assist level: Touching or steadying assistance (Pt > 75%) Chair/bed transfer assistive device: Armrests     Locomotion Ambulation Ambulation activity did not occur: Safety/medical Investment banker, operationalconcerns         Wheelchair Wheelchair activity did not occur: Safety/medical concerns     Assist Level: Dependent (Pt equals 0%)(dependent with manual chair, uses scooter at home)  Cognition Comprehension Comprehension assist level: Understands basic 90% of the time/cues < 10% of the time  Expression Expression assist level: Expresses basic 90% of the time/requires cueing < 10% of the time.  Social Interaction Social Interaction assist level: Interacts appropriately 90% of the time - Needs monitoring or encouragement for participation or interaction.  Problem Solving Problem solving assist level: Solves complex 90% of the time/cues < 10% of the time  Memory Memory assist level: Complete Independence: No helper   Medical Problem List and Plan: 1.Decreased functional mobilitysecondary to right trimalleolar ankle fracture status post ORIF 04/23/2018. Hx of spina bifida. Nonweightbearing.  Cont CIR, team conference today  -right ankle XR reviewed, surgical site in alignment   -appreciate ortho tech's help in reapplying  padding/splint 2. DVT Prophylaxis/Anticoagulation: Subcutaneous Lovenox.     Vascular study negative and limited 3. Pain Management:Cymbalta 30 mg nightly, hydrocodone Robaxin and Advil as needed. 4. Mood:Provide emotional support 5. Neuropsych: This patientiscapable of making decisions on herown behalf. 6. Skin/Wound Care:Routine skin checks. No visible drainage through splint 7. 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 Patient did use electric scooter for long distances. 10.Constipation. Laxative assistance 11.Obesity. BMI 52.99. Follow-up dietary 12. ABLA:   hgb 11.8 on 5/31   -continue to encourage adequate nutrition  -follow up later this week      LOS (Days) 5 A FACE TO FACE EVALUATION WAS PERFORMED  Ranelle OysterZachary T Swartz, MD 05/04/2018 9:03 AM

## 2018-05-05 ENCOUNTER — Inpatient Hospital Stay (HOSPITAL_COMMUNITY): Payer: Self-pay | Admitting: Physical Therapy

## 2018-05-05 ENCOUNTER — Inpatient Hospital Stay (HOSPITAL_COMMUNITY): Payer: Self-pay

## 2018-05-05 NOTE — Plan of Care (Signed)
  Problem: Consults Goal: RH GENERAL PATIENT EDUCATION Description See Patient Education module for education specifics. Outcome: Progressing Goal: Skin Care Protocol Initiated - if Braden Score 18 or less Description If consults are not indicated, leave blank or document N/A Outcome: Progressing   Problem: RH SKIN INTEGRITY Goal: RH STG SKIN FREE OF INFECTION/BREAKDOWN Description Skin to remain free from infection and breakdown with mod assist while on rehab.  Outcome: Progressing Goal: RH STG MAINTAIN SKIN INTEGRITY WITH ASSISTANCE Description STG Maintain Skin Integrity With mod Assistance.  Outcome: Progressing Flowsheets (Taken 05/05/2018 1550) STG: Maintain skin integrity with assistance: 4-Minimal assistance   Problem: RH SAFETY Goal: RH STG ADHERE TO SAFETY PRECAUTIONS W/ASSISTANCE/DEVICE Description STG Adhere to Safety Precautions With min Assistance appropriate assistive Device.  Outcome: Progressing Flowsheets (Taken 05/05/2018 1550) STG:Pt will adhere to safety precautions with assistance/device: 4-Minimal assistance Goal: RH STG DECREASED RISK OF FALL WITH ASSISTANCE Description STG Decreased Risk of Fall With min Assistance and maintaining NWB status on the RLE.  Outcome: Progressing Flowsheets (Taken 05/05/2018 1550) [id number]:Decreased risk of fall  with assistance/device: 4-Minimal assistance   Problem: RH PAIN MANAGEMENT Goal: RH STG PAIN MANAGED AT OR BELOW PT'S PAIN GOAL Description <3 on a 0-10 pain scale  Outcome: Progressing   Problem: RH KNOWLEDGE DEFICIT GENERAL Goal: RH STG INCREASE KNOWLEDGE OF SELF CARE AFTER HOSPITALIZATION Description Patient will be able to demonstrate knowledge of medications, weight bearing status of RLE, and follow up care with the MD post discharge with min assist from rehab staff.  Outcome: Progressing

## 2018-05-05 NOTE — Progress Notes (Signed)
Occupational Therapy Session Note  Patient Details  Name: [name] MRN: [id number] Date of Birth: 1970-12-18  Today's Date: 05/05/2018 OT Individual Time: 0900-1000 OT Individual Time Calculation (min): 60 min    Short Term Goals: Week 1:  OT Short Term Goal 1 (Week 1): STG=LTGs d/t ELOS  Skilled Therapeutic Interventions/Progress Updates:    Pt resting in bed upon arrival and eager for therapy.  Pt transferred to her scooter with steady A and transitioned to ADL apartment.  Pt practiced scooter<>sofa transfer X 3 with supervision.  Focus transitioned to sit<>stand with steady A from sofa and standing tolerance to play games(s) of [employer].  Pt able to stand 1'45" to 2' before requiring rest.  Pt stated she could feel that her LLE was fatiguing. Focus on functional transfers and standing balance to increase independence with BADLs and toileting tasks.  Pt returned to room and transferred to w/c awaiting next therapy.  All needs within reach.   Therapy Documentation Precautions:  Precautions Precautions: Fall Precaution Comments: s/p ankle ORIF Restrictions Weight Bearing Restrictions: Yes RLE Weight Bearing: Non weight bearing   Pain: Pain Assessment Pain Scale: 0-10 Pain Score: 4  Faces Pain Scale: Hurts a little bit Pain Type: Surgical pain Pain Location: Ankle Pain Intervention(s): Medication (See eMAR);Repositioned  See Function Navigator for Current Functional Status.   Therapy/Group: Individual Therapy  [name], Sriyan Cutting Chappell 05/05/2018, 2:37 PM

## 2018-05-05 NOTE — Progress Notes (Signed)
Physical Therapy Session Note  Patient Details  Name: [name] MRN: [id number] Date of Birth: 1971-01-19  Today's Date: 05/05/2018 PT Individual Time: 1100-1157 PT Individual Time Calculation (min): 57 min   Short Term Goals: Week 1:  PT Short Term Goal 1 (Week 1): STG = LTG due to short ELOS.  Skilled Therapeutic Interventions/Progress Updates:    no c/o pain at rest.  Session focus on activity tolerance, UE/LE strength, and standing with RW.    Pt propels w/c to and from therapy gym with BUEs for activity tolerance and mobility.  Short rest breaks throughout for fatigue.  Pt completes sit<>stand throughout session with RW and close supervision, occasional cues for pushing up from w/c.  Attempted to lift LLE off floor in preparation for hopping, but pt unable to clear foot effectively, so pt performs 2x5+10 reps of standing heel raises focus on use of UEs to lift.  LLE therex for strengthening and activity tolerance, 20 reps ankle DF, PF, inversion, eversion, LAQ, and hamstring pulls all with level 4 theraband, and 2x10 reps w/c push ups.  Educated on pressure relief, and d/c planning to include progressing LE/UE strength with standing/pre-hopping while in rehab and progressing towards gait/hopping in home environment with HHPT/OT.  Rest breaks provided throughout therex for fatigue.  Returned to room at end of session in w/c and positioned upright with call bell in reach and needs met.   Therapy Documentation Precautions:  Precautions Precautions: Fall Precaution Comments: s/p ankle ORIF Restrictions Weight Bearing Restrictions: Yes RLE Weight Bearing: Non weight bearing   See Function Navigator for Current Functional Status.   Therapy/Group: Individual Therapy  [name] 05/05/2018, 11:59 AM

## 2018-05-05 NOTE — Progress Notes (Signed)
Occupational Therapy Session Note  Patient Details  Name: [name] MRN: [id number] Date of Birth: March 13, 1971  Today's Date: 05/05/2018 OT Individual Time: 1300-1330 OT Individual Time Calculation (min): 30 min    Short Term Goals: Week 1:  OT Short Term Goal 1 (Week 1): STG=LTGs d/t ELOS  Skilled Therapeutic Interventions/Progress Updates:    OT intervention with focus on functional transfers and BUE therex to increase UB/UE strength and independence with transfers.  Pt currently unable to hop with RW 2/2 decreased LLE and BUE strength.  Pt unable to pivot 2/2 L ankle instability.  Pt would like to be able to "hop" into bathroom at home (`2') to take shower.  Pt educated on w/c pushups and performed 3 X 8.  Pt engaged in BUE therex on SciFit (Random level 2 X 8 mins). Pt returned to room and remained in w/c with all needs within reach.   Therapy Documentation Precautions:  Precautions Precautions: Fall Precaution Comments: s/p ankle ORIF Restrictions Weight Bearing Restrictions: Yes RLE Weight Bearing: Non weight bearing   Pain: Pain Assessment Pain Scale: 0-10 Pain Score: 4  Faces Pain Scale: Hurts a little bit Pain Type: Surgical pain Pain Location: Ankle Pain Intervention(s): Medication (See eMAR);Repositioned  See Function Navigator for Current Functional Status.   Therapy/Group: Individual Therapy  [name], [name] 05/05/2018, 2:42 PM

## 2018-05-05 NOTE — Progress Notes (Signed)
[place] PHYSICAL MEDICINE & REHABILITATION     PROGRESS NOTE    Subjective/Complaints: No new complaint. Happy with rehab progress  ROS: Patient denies fever, rash, sore throat, blurred vision, nausea, vomiting, diarrhea, cough, shortness of breath or chest pain, joint or back pain, headache, or mood change.    Objective:  Dg Ankle 2 Views Right  Result Date: 05/03/2018 CLINICAL DATA:  Persistent ankle pain EXAM: RIGHT ANKLE - 2 VIEW COMPARISON:  04/23/2018 FINDINGS: Casting material is noted in place. Postsurgical changes are noted in the distal tibia and fibula similar to that seen on the prior intraoperative exam. Fracture fragments are in near anatomic alignment. The overall appearance is similar to that seen intraoperatively. No new focal abnormality is seen. IMPRESSION: Stable appearance of distal fibular and tibial fractures following fixation. Electronically Signed   By: Alcide Clever M.D.   On: 05/03/2018 15:46   No results for input(s): WBC, HGB, HCT, PLT in the last 72 hours. No results for input(s): NA, K, CL, GLUCOSE, BUN, CREATININE, CALCIUM in the last 72 hours.  Invalid input(s): CO CBG (last 3)  No results for input(s): GLUCAP in the last 72 hours.  Wt Readings from Last 3 Encounters:  04/29/18 121.6 kg (268 lb 1.3 oz)  04/23/18 123.1 kg (271 lb 5 oz)  04/12/18 122.5 kg (270 lb)     Intake/Output Summary (Last 24 hours) at 05/05/2018 0851 Last data filed at 05/05/2018 0700 Gross per 24 hour  Intake 600 ml  Output -  Net 600 ml    Vital Signs: Blood pressure 126/66, pulse 82, temperature 98 F (36.7 C), temperature source Oral, resp. rate 17, height 5' (1.524 m), weight 121.6 kg (268 lb 1.3 oz), last menstrual period 04/08/2018, SpO2 98 %. Physical Exam:  Constitutional: No distress . Vital signs reviewed. HEENT: EOMI, oral membranes moist Neck: supple Cardiovascular: RRR without murmur. No JVD    Respiratory: CTA Bilaterally without wheezes or rales.  Normal effort    GI: BS +, non-tender, non-distended  Musc: Lymphedema b/l LE. Right LLE splint in place. Neurological: She isalertand oriented Follows commands Motor: 5/5 B/l UE LLE 4/5 proximal to distal RLE 4/5 HF, KE, wiggles toes right foot --motor stable Skin:  warm and dry. right foot remains warm, pink Psych: very pleasant and cooperative  Assessment/Plan: 1. Impaired functional mobility  secondary to right trimalleolar ankle fracture which require 3+ hours per day of interdisciplinary therapy in a comprehensive inpatient rehab setting. Physiatrist is providing close team supervision and 24 hour management of active medical problems listed below. Physiatrist and rehab team continue to assess barriers to discharge/monitor patient progress toward functional and medical goals.  Function:  Bathing Bathing position   Position: Bed  Bathing parts Body parts bathed by patient: Right arm, Left arm, Chest, Abdomen, Front perineal area, Right upper leg, Left upper leg, Left lower leg, Buttocks Body parts bathed by helper: Buttocks, Back  Bathing assist Assist Level: Touching or steadying assistance(Pt > 75%)      Upper Body Dressing/Undressing Upper body dressing   What is the patient wearing?: Pull over shirt/dress, Bra Bra - Perfomed by patient: Thread/unthread right bra strap, Thread/unthread left bra strap, Hook/unhook bra (pull down sports bra)   Pull over shirt/dress - Perfomed by patient: Thread/unthread right sleeve, Thread/unthread left sleeve, Put head through opening, Pull shirt over trunk          Upper body assist Assist Level: Set up   Set up : To obtain  clothing/put away  Lower Body Dressing/Undressing Lower body dressing   What is the patient wearing?: Pants     Pants- Performed by patient: Thread/unthread right pants leg, Thread/unthread left pants leg, Pull pants up/down Pants- Performed by helper: Thread/unthread right pants leg, Thread/unthread left  pants leg     Socks - Performed by patient: Don/doff right sock(sock aide)     Shoes - Performed by helper: Don/doff left shoe, Fasten right          Lower body assist Assist for lower body dressing: Supervision or verbal cues, Assistive device Assistive Device Comment: Chief of Staffreacher    Toileting Toileting   Toileting steps completed by patient: Adjust clothing prior to toileting, Performs perineal hygiene, Adjust clothing after toileting Toileting steps completed by helper: Adjust clothing prior to toileting, Adjust clothing after toileting    Toileting assist Assist level: Supervision or verbal cues   Transfers Chair/bed transfer   Chair/bed transfer method: Squat pivot Chair/bed transfer assist level: Moderate assist (Pt 50 - 74%/lift or lower)(one episode of LOB requiring assist to recover to mat; otherwise, pt required min guarding.) Chair/bed transfer assistive device: Armrests     Locomotion Ambulation Ambulation activity did not occur: Safety/medical concerns         Wheelchair Wheelchair activity did not occur: Safety/medical concerns Type: Motorized Max wheelchair distance: 150 ft Assist Level: No help, No cues, assistive device, takes more than reasonable amount of time  Cognition Comprehension Comprehension assist level: Understands basic 90% of the time/cues < 10% of the time  Expression Expression assist level: Expresses basic 90% of the time/requires cueing < 10% of the time.  Social Interaction Social Interaction assist level: Interacts appropriately 90% of the time - Needs monitoring or encouragement for participation or interaction.  Problem Solving Problem solving assist level: Solves complex 90% of the time/cues < 10% of the time  Memory Memory assist level: Complete Independence: No helper   Medical Problem List and Plan: 1.Decreased functional mobilitysecondary to right trimalleolar ankle fracture status post ORIF 04/23/2018. Hx of spina bifida.  Nonweightbearing.  Cont CIR, team conference today  -right ankle XR stable, surgical site in alignment   -appreciate ortho tech's help in reapplying padding/splint 2. DVT Prophylaxis/Anticoagulation: Subcutaneous Lovenox.     Vascular study negative and limited 3. Pain Management:Cymbalta 30 mg nightly, hydrocodone Robaxin and Advil as needed. 4. Mood:Provide emotional support 5. Neuropsych: This patientiscapable of making decisions on herown behalf. 6. Skin/Wound Care:Routine skin checks. No visible drainage through splint 7. 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 Patient did use electric scooter for long distances. 10.Constipation. Laxative assistance 11.Obesity. BMI 52.99. Follow-up dietary 12. ABLA:   hgb 11.8 on 5/31   -continue to encourage adequate nutrition  -follow up Friday     LOS (Days) 6 A FACE TO FACE EVALUATION WAS PERFORMED  Ranelle OysterZachary T Swartz, MD 05/05/2018 8:51 AM

## 2018-05-05 NOTE — Progress Notes (Signed)
Physical Therapy Session Note  Patient Details  Name: Renee FreesBarbara L Quest MRN: [id number] Date of Birth: 1971/04/07  Today's Date: 05/05/2018 PT Individual Time: 1400-1445 PT Individual Time Calculation (min): 45 min   Short Term Goals: Week 1:  PT Short Term Goal 1 (Week 1): STG = LTG due to short ELOS.  Skilled Therapeutic Interventions/Progress Updates:    Pt received seated in w/c in room, agreeable to PT. No complaints of pain. Squat pivot transfer w/c to/from mat table with min A for balance going both R and L. Sit to stand x 5 reps from mat table to RW with CGA. Standing clothing management simulation pulling theraband down from waist to thighs and back up again alternating UE support on RW with CGA for balance. Pt is able to complete 2 repetitions before onset of fatigue and needing to sit. Squat pivot transfer w/c to/from low, pliable surface of couch to simulate home environment, min A for balance. Pt does exhibit L ankle instability and has inversion of ankle during transfers, working on obtaining an aircast for improved stability. Discussed home setup and plans for commode setup and placement while pt is home during the day and her husband is at work. Pt left seated in w/c in room with needs in reach.  Therapy Documentation Precautions:  Precautions Precautions: Fall Precaution Comments: s/p ankle ORIF Restrictions Weight Bearing Restrictions: Yes RLE Weight Bearing: Non weight bearing  See Function Navigator for Current Functional Status.   Therapy/Group: Individual Therapy  Peter Congoaylor Shakeita Vandevander, PT, DPT  05/05/2018, 4:44 PM

## 2018-05-06 ENCOUNTER — Inpatient Hospital Stay (HOSPITAL_COMMUNITY): Payer: Self-pay

## 2018-05-06 ENCOUNTER — Inpatient Hospital Stay (HOSPITAL_COMMUNITY): Payer: Self-pay | Admitting: Physical Therapy

## 2018-05-06 NOTE — Progress Notes (Signed)
[place] PHYSICAL MEDICINE & REHABILITATION     PROGRESS NOTE    Subjective/Complaints: No new issues. Pleased with progress. Able to pull her own pants on yesterday  ROS: Patient denies fever, rash, sore throat, blurred vision, nausea, vomiting, diarrhea, cough, shortness of breath or chest pain, joint or back pain, headache, or mood change.   Objective:  No results found. No results for input(s): WBC, HGB, HCT, PLT in the last 72 hours. No results for input(s): NA, K, CL, GLUCOSE, BUN, CREATININE, CALCIUM in the last 72 hours.  Invalid input(s): CO CBG (last 3)  No results for input(s): GLUCAP in the last 72 hours.  Wt Readings from Last 3 Encounters:  04/29/18 121.6 kg (268 lb 1.3 oz)  04/23/18 123.1 kg (271 lb 5 oz)  04/12/18 122.5 kg (270 lb)     Intake/Output Summary (Last 24 hours) at 05/06/2018 0845 Last data filed at 05/05/2018 1700 Gross per 24 hour  Intake 420 ml  Output -  Net 420 ml    Vital Signs: Blood pressure 126/67, pulse 73, temperature 97.8 F (36.6 C), temperature source Oral, resp. rate 18, height 5' (1.524 m), weight 121.6 kg (268 lb 1.3 oz), last menstrual period 04/08/2018, SpO2 98 %. Physical Exam:  Constitutional: No distress . Vital signs reviewed. HEENT: EOMI, oral membranes moist Neck: supple Cardiovascular: RRR without murmur. No JVD    Respiratory: CTA Bilaterally without wheezes or rales. Normal effort    GI: BS +, non-tender, non-distended  Musc: Lymphedema b/l LE. Right LLE splint in place. Neurological: She isalertand oriented Follows commands Motor: 5/5 B/l UE LLE 4/5 proximal to distal RLE 4/5 HF, KE, wiggles toes right foot --motor stable Skin: pink and warm Psych: very pleasant and cooperative  Assessment/Plan: 1. Impaired functional mobility  secondary to right trimalleolar ankle fracture which require 3+ hours per day of interdisciplinary therapy in a comprehensive inpatient rehab setting. Physiatrist is providing  close team supervision and 24 hour management of active medical problems listed below. Physiatrist and rehab team continue to assess barriers to discharge/monitor patient progress toward functional and medical goals.  Function:  Bathing Bathing position   Position: Bed  Bathing parts Body parts bathed by patient: Right arm, Left arm, Chest, Abdomen, Front perineal area, Right upper leg, Left upper leg, Left lower leg, Buttocks Body parts bathed by helper: Buttocks, Back  Bathing assist Assist Level: Touching or steadying assistance(Pt > 75%)      Upper Body Dressing/Undressing Upper body dressing   What is the patient wearing?: Pull over shirt/dress, Bra Bra - Perfomed by patient: Thread/unthread right bra strap, Thread/unthread left bra strap, Hook/unhook bra (pull down sports bra)   Pull over shirt/dress - Perfomed by patient: Thread/unthread right sleeve, Thread/unthread left sleeve, Put head through opening, Pull shirt over trunk          Upper body assist Assist Level: Set up   Set up : To obtain clothing/put away  Lower Body Dressing/Undressing Lower body dressing   What is the patient wearing?: Pants     Pants- Performed by patient: Thread/unthread right pants leg, Thread/unthread left pants leg, Pull pants up/down Pants- Performed by helper: Thread/unthread right pants leg, Thread/unthread left pants leg     Socks - Performed by patient: Don/doff right sock(sock aide)     Shoes - Performed by helper: Don/doff left shoe, Fasten right          Lower body assist Assist for lower body dressing: Supervision or verbal cues,  Assistive device Assistive Device Comment: Child psychotherapist steps completed by patient: Adjust clothing prior to toileting, Performs perineal hygiene, Adjust clothing after toileting Toileting steps completed by helper: Adjust clothing prior to toileting, Adjust clothing after toileting    Toileting assist Assist  level: Supervision or verbal cues   Transfers Chair/bed transfer   Chair/bed transfer method: Squat pivot Chair/bed transfer assist level: Touching or steadying assistance (Pt > 75%) Chair/bed transfer assistive device: Armrests     Locomotion Ambulation Ambulation activity did not occur: Safety/medical concerns         Wheelchair Wheelchair activity did not occur: Safety/medical concerns Type: Manual Max wheelchair distance: 150 Assist Level: No help, No cues, assistive device, takes more than reasonable amount of time  Cognition Comprehension Comprehension assist level: Understands basic 90% of the time/cues < 10% of the time  Expression Expression assist level: Expresses basic 90% of the time/requires cueing < 10% of the time.  Social Interaction Social Interaction assist level: Interacts appropriately 90% of the time - Needs monitoring or encouragement for participation or interaction.  Problem Solving Problem solving assist level: Solves complex 90% of the time/cues < 10% of the time  Memory Memory assist level: Complete Independence: No helper   Medical Problem List and Plan: 1.Decreased functional mobilitysecondary to right trimalleolar ankle fracture status post ORIF 04/23/2018. Hx of spina bifida. Nonweightbearing.  Cont CIR, team conference today  -right ankle XR stable, surgical site in alignment   -appreciate ortho tech's help in reapplying padding/splint 2. DVT Prophylaxis/Anticoagulation: Subcutaneous Lovenox.     Vascular study negative and limited 3. Pain Management:Cymbalta 30 mg nightly, hydrocodone Robaxin and Advil as needed. 4. Mood:Provide emotional support 5. Neuropsych: This patientiscapable of making decisions on herown behalf. 6. Skin/Wound Care:continue routine skin care/prevention 7. 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 Recurrent  hydrocephalus. Latest shunt revision 2006 9.Spina bifida. Patient did use electric scooter for long distances. 10.Constipation. Laxative assistance 11.Obesity. BMI 52.99. Follow-up dietary 12. ABLA:   hgb 11.8 on 5/31   -continue to encourage adequate nutrition  -follow up tomorrow     LOS (Days) 7 A FACE TO FACE EVALUATION WAS PERFORMED  Ranelle Oyster, MD 05/06/2018 8:45 AM

## 2018-05-06 NOTE — Progress Notes (Signed)
Physical Therapy Session Note  Patient Details  Name: Shirline FreesBarbara L Shatz MRN: [id number] Date of Birth: [birth year]  Today's Date: 05/06/2018 PT Individual Time: 1110-1200 PT Individual Time Calculation (min): 50 min   Short Term Goals: Week 1:  PT Short Term Goal 1 (Week 1): STG = LTG due to short ELOS.  Skilled Therapeutic Interventions/Progress Updates:    Session focused on d/c planning and discussion about home environment, seated therex (self use of theraband) to perform functional ankle strengthening exercises, standing therex and standing balance activities. Pt instructed in blue theraband ankle PF, ankle inversion and eversion, and PT assisted with resisted DF and hamstring curls x 10 reps each on LLE. In standing, pt performed heel raises on L x 10 reps and 2 sets of 10 reps each of mini squats on LLE with steadying assist for balance. To address functional dynamic standing balance, pt able to take 1 UE off at a time and maintain position about 30 sec - 1 min x 2 reps each side with seated rest break demonstrating improved ankle strategy. Educated on importance of pressure relief and pt return demonstrated technique for w/c push ups x 10 reps with 5 second hold.   Therapy Documentation Precautions:  Precautions Precautions: Fall Precaution Comments: s/p ankle ORIF Restrictions Weight Bearing Restrictions: Yes RLE Weight Bearing: Non weight bearing   Pain: Denies pain.    See Function Navigator for Current Functional Status.   Therapy/Group: Individual Therapy  Karolee StampsGray, Raydell Maners Darrol PokeBrescia  Rhet Rorke B. Dossie Ocanas, PT, DPT  05/06/2018, 12:16 PM

## 2018-05-06 NOTE — Progress Notes (Signed)
Occupational Therapy Session Note  Patient Details  Name: Shirline FreesBarbara L Dziedzic MRN: [id number] Date of Birth: 04/22/1971  Today's Date: 05/06/2018 OT Individual Time: [id number] OT Individual Time Calculation (min): 85 min    Short Term Goals: Week 1:  OT Short Term Goal 1 (Week 1): STG=LTGs d/t ELOS  Skilled Therapeutic Interventions/Progress Updates:    OT intervention with focus on BSC transfers and standing balance to increase independence with BADLs. Pt practiced w/c<>sofa transfers X 3 and sofa<>BSC transfers X 4.  Transfers progressed from in A to supervisoin.  Pt transitioned to standing activities with Wii bowling at supervision level.  Pt returned to room and remained in w/c with all needs within reach.   Therapy Documentation Precautions:  Precautions Precautions: Fall Precaution Comments: s/p ankle ORIF Restrictions Weight Bearing Restrictions: Yes RLE Weight Bearing: Non weight bearing   Pain: Pain Assessment Pain Scale: 0-10 Pain Score: 0-No pain Pain Type: Acute pain;Neuropathic pain Pain Location: Ankle Pain Orientation: Right Pain Descriptors / Indicators: Aching;Dull Pain Onset: On-going Pain Intervention(s): Medication (See eMAR)  See Function Navigator for Current Functional Status.   Therapy/Group: Individual Therapy  [name], Mickelle Goupil Chappell 05/06/2018, 11:03 AM

## 2018-05-06 NOTE — Progress Notes (Signed)
Physical Therapy Session Note  Patient Details  Name: [name] MRN: [id number] Date of Birth: [birth year]  Today's Date: 05/06/2018 PT Individual Time: 1400-1500 PT Individual Time Calculation (min): 60 min   Short Term Goals: Week 1:  PT Short Term Goal 1 (Week 1): STG = LTG due to short ELOS.  Skilled Therapeutic Interventions/Progress Updates:    Pt reported no c/o pain prior to the start of her treatment session today. Pt self propelled w/c 150' to and from treatment gym independently. PT initiated transfer training from w/c<>EOM utilizing squat pivot transfer technique requiring steadying assist for safety concerns. PT provided verbal cues for reaching back prior to eccentric lower for increased control. PT then initiated dynamic standing balance activity incorporating L UE reaching while standing using RW to retrieve and clip pins requiring varying degrees of grip strength. Pt was able to perform balance activity relying on L LE and R UE for balancing support with RW for 2 min duration before requiring seated rest break. Pt then performed standing heel raises on L LE for 2 sets/ 10 reps requiring rest break in between each set. Pt reported dizziness s/p first set which subsided with time. PT assessed: BP= 144/84; HR;99. Once sx subsided, Pt performed squats standing on LLE with the RW provided anterior support for 1 set/10 reps. Pt maintained weight bearing precautions t/o treatment duration. PT then initiated STS from EOM<>RW for x5 reps providing steadying assist and verbal cues for proper hand placement for safety. Pt reported R UE hand placement on the RW and L UE hand placement pushing up from the mat provided increased efficiency and success with task completion. Pt reported she would benefit from performance of STS couch <> RW to further prepare her for safe discharge and household mobility. Pt reports primary concerns are her lack of confidence in regards to safe ambulation and  transfers, as well as, her R ankle locked in a toe out position. Pt reports she is waiting to follow up and address her concerns with her physician. PT left Pt in her room with call bell in reach and all needs met.   Therapy Documentation Precautions:  Precautions Precautions: Fall Precaution Comments: s/p ankle ORIF Restrictions Weight Bearing Restrictions: Yes RLE Weight Bearing: Non weight bearing             See Function Navigator for Current Functional Status.   Therapy/Group: Individual Therapy  Floreen Comber 05/06/2018, 4:33 PM

## 2018-05-06 NOTE — Progress Notes (Signed)
Social Work Patient ID: [name], female   DOB: [birth year], 47 y.o.   MRN: [id number]   CSW met with pt who is pleased with her progress and is motivated to keep working with therapists.  CSW gave her conference update and targeted d/c date of 05-12-18 and she is pleased with that and feels she will be ready by then.  CSW is reaching out to workers comp Tourist information centre manager to order DME and [place] [facility] f/u.  CSW will continue to follow and assist as needed.

## 2018-05-06 NOTE — Patient Care Conference (Signed)
Inpatient RehabilitationTeam Conference and Plan of Care Update Date: 05/04/2018   Time: 2:05 PM    Patient Name: [name]      Medical Record Number: [id number]  Date of Birth: 21-Oct-1971 Sex: Female         Room/Bed: 4W12C/4W12C-01 Payor Info: Payor: [employer] / Plan: Dellis AnesGALLAGHER / Product Type: *No Product type* /    Admitting Diagnosis: Rt ankle FX  Admit Date/Time:  04/29/2018  2:09 PM Admission Comments: No comment available   Primary Diagnosis:  <principal problem not specified> Principal Problem: <principal problem not specified>  Patient Active Problem List   Diagnosis Date Noted  . Hypoalbuminemia due to protein-calorie malnutrition (HCC)   . Acute blood loss anemia   . 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 History of Chiari malformation   . Trimalleolar fracture of ankle, closed, left, initial encounter 04/29/2018  . Closed displaced trimalleolar fracture of right ankle 04/23/2018    Expected Discharge Date: Expected Discharge Date: 05/12/18  Team Members Present: Physician leading conference: Dr. Faith RogueZachary Swartz Social Worker Present: Staci AcostaJenny Marlene Pfluger, LCSW Nurse Present: Allayne Stackhelsey Evans, RN PT Present: Teodoro Kilaitlin Penven-Crew, PT OT Present: Ardis Rowanom Lanier, COTA;Elianna Windom Katrinka BlazingSmith, OT PPS Coordinator present : Tora DuckMarie Noel, RN, CRRN     Current Status/Progress Goal Weekly Team Focus  Medical   Patient with Arnold-Chiari malformation and spina bifida status post right trimalleolar ankle fracture.  Some pain control issues  Improve functional mobility and  Continue wound care, pain management in postoperative follow-up   Bowel/Bladder   Continent of Bladder/bowel LBM 05/03/18, Has schedule and prn meds ordered  remain continent  Assess qs and prn   Swallow/Nutrition/ Hydration             ADL's   bathing/dressing EOB-supervisoin/min A; BSC tranfsers-min A; toileting-supervision  mod I overall  functional transfers, activity tolerance,  education, discharge planning   Mobility   supervision<>min guard for squat/pivot transfers  mod I transfers, power w/c mobility mod I, standard w/c mobility supervision   transfer training, d/c planning, strengthening, endurance   Communication             Safety/Cognition/ Behavioral Observations            Pain   medicated with PRN -Ibuphofen x1   < 2  Assess QS/PRN right ankle/foot pain    Skin   Right foot/ankle cast intact, UTAto surgical area, monitor and assess for discomfort/  Maintain surgical area freeof infection  Assess QS and prn surgical wound , notify Medical team for noted complications    Rehab Goals Patient on target to meet rehab goals: Yes Rehab Goals Revised: none *See Care Plan and progress notes for long and short-term goals.     Barriers to Discharge  Current Status/Progress Possible Resolutions Date Resolved   Physician    Medical stability        Treatment of pain and appropriate splinting of right lower extremity      Nursing                  PT                    OT                  SLP                SW  Discharge Planning/Teaching Needs:  Pt to return to her home where she lives with her husband and 69 y/o son.  Pt is independent to direct her care.   Team Discussion:  Pt with right ankle fracture with surgical repair following a fall at work.  Pt also has spina bifida.  Pt is using ibuprofen for pain and it is working for her.  Pt has mod I therapy goals and should meet those by targeted d/c date.  Pt biggest hurdle will be transferring from the sofa to [facility] and pt wants to get to her bedroom to shower.  Pt moves too quickly sometimes.  Making good progress.  Revisions to Treatment Plan:  none    Continued Need for Acute Rehabilitation Level of Care: The patient requires daily medical management by a physician with specialized training in physical medicine and rehabilitation for the following conditions: Daily direction of a  multidisciplinary physical rehabilitation program to ensure safe treatment while eliciting the highest outcome that is of practical value to the patient.: Yes Daily medical management of patient stability for increased activity during participation in an intensive rehabilitation regime.: Yes Daily analysis of laboratory values and/or radiology reports with any subsequent need for medication adjustment of medical intervention for : Post surgical problems;Neurological problems  Joann Kulpa, Vista Deck 05/06/2018, 11:48 PM

## 2018-05-07 ENCOUNTER — Inpatient Hospital Stay (HOSPITAL_COMMUNITY): Payer: No Typology Code available for payment source | Admitting: Occupational Therapy

## 2018-05-07 ENCOUNTER — Inpatient Hospital Stay (HOSPITAL_COMMUNITY): Payer: Self-pay

## 2018-05-07 ENCOUNTER — Inpatient Hospital Stay (HOSPITAL_COMMUNITY): Payer: Self-pay | Admitting: Physical Therapy

## 2018-05-07 LAB — BASIC METABOLIC PANEL
Anion gap: 8 (ref 5–15)
BUN: 15 mg/dL (ref 6–20)
CO2: 27 mmol/L (ref 22–32)
CREATININE: 0.88 mg/dL (ref 0.44–1.00)
Calcium: 9 mg/dL (ref 8.9–10.3)
Chloride: 106 mmol/L (ref 101–111)
GFR calc Af Amer: >60 mL/min (ref >60)
GLUCOSE: 111 mg/dL — AB (ref 65–99)
POTASSIUM: 3.6 mmol/L (ref 3.5–5.1)
Sodium: 141 mmol/L (ref 135–145)

## 2018-05-07 LAB — CBC
HEMATOCRIT: 38.1 % (ref 36.0–46.0)
Hemoglobin: 12.2 g/dL (ref 12.0–15.0)
MCH: 29.3 pg (ref 26.0–34.0)
MCHC: 32 g/dL (ref 30.0–36.0)
MCV: 91.4 fL (ref 78.0–100.0)
Platelets: 386 10*3/uL (ref 150–400)
RBC: 4.17 MIL/uL (ref 3.87–5.11)
RDW: 13.2 % (ref 11.5–15.5)
WBC: 5.3 10*3/uL (ref 4.0–10.5)

## 2018-05-07 NOTE — Progress Notes (Signed)
Physical Therapy Session Note  Patient Details  Name: [name] MRN: [id number] Date of Birth: 30-Nov-1971  Today's Date: 05/07/2018 PT Individual Time:1430-1530    60 min   Short Term Goals: Week 1:  PT Short Term Goal 1 (Week 1): STG = LTG due to short ELOS.  Skilled Therapeutic Interventions/Progress Updates:   Pt received sitting in WC and agreeable to PT  WC mobility through rehab unit 2x 1106f with supervision assist from PT. Pt [place] to [place]. WC mobility through hospital gift shop with supervision assist x [id number] min cues for safety and obstacle awareness.   Squat pivot transfers to various surfaces including arm chair, mat table, and WC with close supervision assist. PT also instructed pt in stand pivot transfer to and from [facility], min assist from PT, and moderate cues improved use of BUE and decreased hopping on the LLE.  Gait training initiated in parallel bars, 3 ft forward/3 ft backward with min assist from PT and moderate cues for proper swing to gait pattern and maintaining NWB through the RLE.   5xSit<>stand with 1 UE support on RW: 14sec with supervision assist from PT for safety.   Patient returned to [facility] and [facility] in reach and all needs met.          Therapy Documentation Precautions:  Precautions Precautions: Fall Precaution Comments: s/p ankle ORIF Restrictions Weight Bearing Restrictions: Yes RLE Weight Bearing: Non weight bearing Vital Signs: Therapy Vitals Pulse Rate: 81 Resp: 18 BP: 123/76 Patient Position (if appropriate): Sitting Oxygen Therapy SpO2: 97 % O2 Device: Room Air Pain: Denies.   See Function Navigator for Current Functional Status.   Therapy/Group: Individual Therapy  AuLorie Phenix/05/2018, 3:19 PM

## 2018-05-07 NOTE — Progress Notes (Signed)
Physical Therapy Weekly Progress Note  Patient Details  Name: [name] MRN: [id number] Date of Birth: [birth year]  Beginning of progress report period: Apr 30, 2018 End of progress report period: May 07, 2018  Today's Date: 05/07/2018 PT Individual Time: 1300-1400 PT Individual Time Calculation (min): 60 min   Patient is making excellent progress towards LTGs.  She currently requires close supervision for transfers to L and R from standard w/c, and from power scooter.  Have been working towards LLE strengthening, standing tolerance, and balance for functional mobility and increased safety in preparation for d/c home at mod I level. Patient continues to demonstrate the following deficits muscle weakness and decreased standing balance and decreased balance strategies and therefore will continue to benefit from skilled PT intervention to increase functional independence with mobility.  Patient progressing toward long term goals..  Continue plan of care.  PT Short Term Goals No short term goals set  Skilled Therapeutic Interventions/Progress Updates:    no c/o pain.  Session focus on problem solving and blocked practice for car transfer from scooter.    Pt transitions from standard w/c to power scooter with close supervision for squat/pivot and propels w/c to ortho gym.  Repeated practice transfers in/out of car with overall min assist for squat/pivot progressing towards supervision with introduction of slide board.  Pt completes 4 transfers with squat/pivot, and 4 with slide board with significant improvement in safety.  Also discussed positioning of scooter in relation to car, positioning of slide board, locking scooter, and moving steering column out of the way throughout session.  Pt returned to room at end of session and positioned back to standard w/c with slide board and supervision.  Positioned to comfort with call bell in reach and needs met.   Therapy Documentation Precautions:   Precautions Precautions: Fall Precaution Comments: s/p ankle ORIF Restrictions Weight Bearing Restrictions: Yes RLE Weight Bearing: Non weight bearing   See Function Navigator for Current Functional Status.  Therapy/Group: Individual Therapy  [name] 05/07/2018, 4:54 PM

## 2018-05-07 NOTE — Progress Notes (Signed)
Occupational Therapy Weekly Progress Note  Patient Details  Name: [name] MRN: [id number] Date of Birth: 1971-08-25  Beginning of progress report period: Apr 30, 2018 End of progress report period: May 07, 2018  Today's Date: 05/07/2018 OT Individual Time: [id number] OT Individual Time Calculation (min): 45 min    Short term goals not set due to estimated length of stay.  Pt is making steady progress towards goals.  Pt currently requires setup assist for bathing and dressing.  Pt able to don pants with use of reacher at sit > stand level.  Supervision for squat pivot transfers to w/c, bed, and BSC.  Pt is able to maintain NWB through RLE during self-care tasks and transfers.  Patient continues to demonstrate the following deficits: muscle weakness, decreased cardiorespiratoy endurance and decreased standing balance, decreased postural control and decreased balance strategies and therefore will continue to benefit from skilled OT intervention to enhance overall performance with BADL and iADL.  See Patient's Care Plan for progression toward long term goals.  Patient progressing toward long term goals..  Continue plan of care.  Skilled Therapeutic Interventions/Progress Updates:    Treatment session with focus on LB dressing and functional transfers.  Pt received upright in bed on the phone with short term disability, asking for additional time.  Upon therapist return pt very appreciative of time to finish discussion.  Engaged in LB dressing with pt able to doff and don pants with use of reacher and sit > stand with RW to pull pants over hips.  Squat pivot transfer to w/c with supervision.  Pt propelled w/c to ADL apt for BUE strengthening and endurance, requiring one rest break.  Engaged in block practice of couch <> drop arm BSC transfers to improve technique and safety with transfer.  Pt able to complete transfer progressing from min guard to supervision with modification of technique.   Increased angle of placement of drop arm BSC to increase ability to reach for handle with Rt hand during transfer.  Also incorporated sit > stand from [facility] to simulate clothing management.  Pt able to complete all aspects of toilet transfer and toileting with supervision this session.  Returned to room as above and left seated upright in w/c with all needs in reach.  Therapy Documentation Precautions:  Precautions Precautions: Fall Precaution Comments: s/p ankle ORIF Restrictions Weight Bearing Restrictions: Yes RLE Weight Bearing: Non weight bearing General:   Vital Signs: Therapy Vitals Temp: (!) 97.5 F (36.4 C) Temp Source: Oral Pulse Rate: 84 Resp: 20 BP: 140/83 Patient Position (if appropriate): Sitting Oxygen Therapy SpO2: 97 % O2 Device: Room Air Pain:    See Function Navigator for Current Functional Status.   Therapy/Group: Individual Therapy  Rosalio LoudHOXIE, Avacyn Kloosterman 05/07/2018, 7:53 AM

## 2018-05-07 NOTE — Progress Notes (Signed)
Occupational Therapy Session Note  Patient Details  Name: Renee FreesBarbara L Stein MRN: [id number] Date of Birth: 21-Jan-1971  Today's Date: 05/07/2018 OT Individual Time: 1015-1100 OT Individual Time Calculation (min): 45 min    Short Term Goals: Week 2:  OT Short Term Goal 1 (Week 2): STG = LTG due to remaining LOS  Skilled Therapeutic Interventions/Progress Updates:    1:1. Pt very excited to share with OT that she has been able to advance pants past hips using RW. Pt with no c/o pain, however pt concerned foot is rotated outward despite internal rotation at hip on command. Pt completes w/c propulsion to/from all tx destinations with supervision and VC for steering for sharp turns. OT applies dycem to elevating leg rest to decrease RLE slipping of leg rest. Pt completes squat pivot transfers with supervision and VC for set up of w/c. Pt completes seated ball toss (chest, bounce and overhead pass) with 1# wrist weights and VC for holding arms at 90* shoulder flexion inbetween passes for shoulder strenghening and endurance required for transfers/BADLs. Pt stands to complete horse shoe toss/clothes pin activity crossing midline with supervision and RW. Exited session with pt seated in w/c and call light in reach  Therapy Documentation Precautions:  Precautions Precautions: Fall Precaution Comments: s/p ankle ORIF Restrictions Weight Bearing Restrictions: Yes RLE Weight Bearing: Non weight bearing  See Function Navigator for Current Functional Status.   Therapy/Group: Individual Therapy  Shon HaleStephanie M Kihanna Kamiya 05/07/2018, 11:01 AM

## 2018-05-07 NOTE — Progress Notes (Signed)
[name] PHYSICAL MEDICINE & REHABILITATION     PROGRESS NOTE    Subjective/Complaints: Pt progressing with therapy. Standing and transferring much more easily  ROS: Patient denies fever, rash, sore throat, blurred vision, nausea, vomiting, diarrhea, cough, shortness of breath or chest pain, joint or back pain, headache, or mood change.   Objective:  No results found. Recent Labs    05/07/18 0739  WBC 5.3  HGB 12.2  HCT 38.1  PLT 386   Recent Labs    05/07/18 0739  NA 141  K 3.6  CL 106  GLUCOSE 111*  BUN 15  CREATININE 0.88  CALCIUM 9.0   CBG (last 3)  No results for input(s): GLUCAP in the last 72 hours.  Wt Readings from Last 3 Encounters:  04/29/18 121.6 kg (268 lb 1.3 oz)  04/23/18 123.1 kg (271 lb 5 oz)  04/12/18 122.5 kg (270 lb)     Intake/Output Summary (Last 24 hours) at 05/07/2018 1109 Last data filed at 05/07/2018 0700 Gross per 24 hour  Intake 840 ml  Output -  Net 840 ml    Vital Signs: Blood pressure 140/83, pulse 84, temperature (!) 97.5 F (36.4 C), temperature source Oral, resp. rate 20, height 5' (1.524 m), weight 121.6 kg (268 lb 1.3 oz), last menstrual period 04/08/2018, SpO2 97 %. Physical Exam:  Constitutional: No distress . Vital signs reviewed. HEENT: EOMI, oral membranes moist Neck: supple Cardiovascular: RRR without murmur. No JVD    Respiratory: CTA Bilaterally without wheezes or rales. Normal effort    GI: BS +, non-tender, non-distended  Musc: Lymphedema b/l LE. Right LLE splint in place, leg externally rotated Neurological: [name] isalertand oriented Follows commands Motor: 5/5 B/l UE LLE 4/5 proximal to distal RLE 4/5 HF, KE, wiggles toes right foot --motor exam stable Skin: pink and warm Psych: very pleasant and cooperative  Assessment/Plan: 1. Impaired functional mobility  secondary to right trimalleolar ankle fracture which require 3+ hours per day of interdisciplinary therapy in a comprehensive inpatient rehab  setting. Physiatrist is providing close team supervision and 24 hour management of active medical problems listed below. Physiatrist and rehab team continue to assess barriers to discharge/monitor patient progress toward functional and medical goals.  Function:  Bathing Bathing position   Position: Bed  Bathing parts Body parts bathed by patient: Right arm, Left arm, Chest, Abdomen, Front perineal area, Right upper leg, Left upper leg, Left lower leg, Buttocks Body parts bathed by helper: Buttocks, Back  Bathing assist Assist Level: Touching or steadying assistance(Pt > 75%)      Upper Body Dressing/Undressing Upper body dressing   What is the patient wearing?: Pull over shirt/dress, Bra Bra - Perfomed by patient: Thread/unthread right bra strap, Thread/unthread left bra strap, Hook/unhook bra (pull down sports bra)   Pull over shirt/dress - Perfomed by patient: Thread/unthread right sleeve, Thread/unthread left sleeve, Put head through opening, Pull shirt over trunk          Upper body assist Assist Level: Set up   Set up : To obtain clothing/put away  Lower Body Dressing/Undressing Lower body dressing   What is the patient wearing?: Pants     Pants- Performed by patient: Thread/unthread right pants leg, Thread/unthread left pants leg, Pull pants up/down Pants- Performed by helper: Thread/unthread right pants leg, Thread/unthread left pants leg     Socks - Performed by patient: Don/doff right sock(sock aide)     Shoes - Performed by helper: Don/doff left shoe, Fasten right  Lower body assist Assist for lower body dressing: Assistive device, Set up Assistive Device Comment: reacher    Toileting Toileting   Toileting steps completed by patient: Adjust clothing prior to toileting, Performs perineal hygiene, Adjust clothing after toileting Toileting steps completed by helper: Adjust clothing prior to toileting, Adjust clothing after toileting    Toileting  assist Assist level: Supervision or verbal cues   Transfers Chair/bed transfer   Chair/bed transfer method: Squat pivot Chair/bed transfer assist level: Touching or steadying assistance (Pt > 75%) Chair/bed transfer assistive device: Armrests     Locomotion Ambulation Ambulation activity did not occur: Safety/medical concerns         Wheelchair Wheelchair activity did not occur: Safety/medical concerns Type: Manual Max wheelchair distance: 150' Assist Level: No help, No cues, assistive device, takes more than reasonable amount of time  Cognition Comprehension Comprehension assist level: Understands basic 90% of the time/cues < 10% of the time  Expression Expression assist level: Expresses basic 90% of the time/requires cueing < 10% of the time.  Social Interaction Social Interaction assist level: Interacts appropriately 90% of the time - Needs monitoring or encouragement for participation or interaction.  Problem Solving Problem solving assist level: Solves complex 90% of the time/cues < 10% of the time  Memory Memory assist level: Complete Independence: No helper   Medical Problem List and Plan: 1.Decreased functional mobilitysecondary to right trimalleolar ankle fracture status post ORIF 04/23/2018. Hx of spina bifida. Nonweightbearing.  Cont CIR, team conference today  -right ankle XR stable, surgical site in alignment   -need to keep right leg from externally rotated while in bed 2. DVT Prophylaxis/Anticoagulation: Subcutaneous Lovenox.     Vascular study negative and limited 3. Pain Management:Cymbalta 30 mg nightly, hydrocodone Robaxin and Advil as needed. 4. Mood:Provide emotional support 5. Neuropsych: This patientiscapable of making decisions on herown behalf. 6. Skin/Wound Care:continue routine skin care/prevention 7. Fluids/Electrolytes/Nutrition:encourage PO  -I personally reviewed the patient's labs today.  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 Patient did use electric scooter for long distances. 10.Constipation. Laxative assistance 11.Obesity. BMI 52.99. Follow-up dietary 12. ABLA:   hgb 11.8 on 5/31----> up to 12.2 today   -continue to encourage adequate nutrition       LOS (Days) 8 A FACE TO FACE EVALUATION WAS PERFORMED  Ranelle OysterZachary T Goldy Calandra, MD 05/07/2018 11:09 AM

## 2018-05-08 ENCOUNTER — Inpatient Hospital Stay (HOSPITAL_COMMUNITY): Payer: Self-pay

## 2018-05-08 ENCOUNTER — Inpatient Hospital Stay (HOSPITAL_COMMUNITY): Payer: Self-pay | Admitting: Physical Therapy

## 2018-05-08 ENCOUNTER — Inpatient Hospital Stay (HOSPITAL_COMMUNITY): Payer: Self-pay | Admitting: Occupational Therapy

## 2018-05-08 NOTE — Progress Notes (Signed)
[place] PHYSICAL MEDICINE & REHABILITATION     PROGRESS NOTE    Subjective/Complaints: No new complaints.  States that she really enjoys therapy and is pleased with her progress.  ROS: Patient denies fever, rash, sore throat, blurred vision, nausea, vomiting, diarrhea, cough, shortness of breath or chest pain, joint or back pain, headache, or mood change.   Objective:  No results found. Recent Labs    05/07/18 0739  WBC 5.3  HGB 12.2  HCT 38.1  PLT 386   Recent Labs    05/07/18 0739  NA 141  K 3.6  CL 106  GLUCOSE 111*  BUN 15  CREATININE 0.88  CALCIUM 9.0   CBG (last 3)  No results for input(s): GLUCAP in the last 72 hours.  Wt Readings from Last 3 Encounters:  04/29/18 121.6 kg (268 lb 1.3 oz)  04/23/18 123.1 kg (271 lb 5 oz)  04/12/18 122.5 kg (270 lb)     Intake/Output Summary (Last 24 hours) at 05/08/2018 0834 Last data filed at 05/08/2018 0700 Gross per 24 hour  Intake 720 ml  Output -  Net 720 ml    Vital Signs: Blood pressure 105/69, pulse 66, temperature 97.7 F (36.5 C), temperature source Oral, resp. rate 18, height 5' (1.524 m), weight 121.6 kg (268 lb 1.3 oz), last menstrual period 04/08/2018, SpO2 95 %. Physical Exam:  Constitutional: No distress . Vital signs reviewed. HEENT: EOMI, oral membranes moist Neck: supple Cardiovascular: RRR without murmur. No JVD    Respiratory: CTA Bilaterally without wheezes or rales. Normal effort    GI: BS +, non-tender, non-distended  Musc: Lymphedema b/l LE. Right LLE splint in place, leg externally rotated Neurological: She isalertand oriented Follows commands Motor: 5/5 B/l UE LLE 4/5 proximal to distal RLE 4/5 HF, KE, wiggles toes right foot --motor exam stable Skin: pink and warm Psych: very pleasant and cooperative  Assessment/Plan: 1. Impaired functional mobility  secondary to right trimalleolar ankle fracture which require 3+ hours per day of interdisciplinary therapy in a comprehensive  inpatient rehab setting. Physiatrist is providing close team supervision and 24 hour management of active medical problems listed below. Physiatrist and rehab team continue to assess barriers to discharge/monitor patient progress toward functional and medical goals.  Function:  Bathing Bathing position   Position: Bed  Bathing parts Body parts bathed by patient: Right arm, Left arm, Chest, Abdomen, Front perineal area, Right upper leg, Left upper leg, Left lower leg, Buttocks Body parts bathed by helper: Buttocks, Back  Bathing assist Assist Level: Touching or steadying assistance(Pt > 75%)      Upper Body Dressing/Undressing Upper body dressing   What is the patient wearing?: Pull over shirt/dress, Bra Bra - Perfomed by patient: Thread/unthread right bra strap, Thread/unthread left bra strap, Hook/unhook bra (pull down sports bra)   Pull over shirt/dress - Perfomed by patient: Thread/unthread right sleeve, Thread/unthread left sleeve, Put head through opening, Pull shirt over trunk          Upper body assist Assist Level: Set up   Set up : To obtain clothing/put away  Lower Body Dressing/Undressing Lower body dressing   What is the patient wearing?: Pants     Pants- Performed by patient: Thread/unthread right pants leg, Thread/unthread left pants leg, Pull pants up/down Pants- Performed by helper: Thread/unthread right pants leg, Thread/unthread left pants leg     Socks - Performed by patient: Don/doff right sock(sock aide)     Shoes - Performed by helper: Don/doff left  shoe, Fasten right          Lower body assist Assist for lower body dressing: Assistive device, Set up Assistive Device Comment: reacher    Toileting Toileting   Toileting steps completed by patient: Adjust clothing prior to toileting, Performs perineal hygiene, Adjust clothing after toileting Toileting steps completed by helper: Adjust clothing after toileting Toileting Assistive Devices: Toilet  aid  Toileting assist Assist level: More than reasonable time, Supervision or verbal cues   Transfers Chair/bed transfer   Chair/bed transfer method: Squat pivot Chair/bed transfer assist level: Touching or steadying assistance (Pt > 75%) Chair/bed transfer assistive device: Armrests     Locomotion Ambulation Ambulation activity did not occur: Safety/medical concerns         Wheelchair Wheelchair activity did not occur: Safety/medical concerns Type: Manual Max wheelchair distance: 150' Assist Level: No help, No cues, assistive device, takes more than reasonable amount of time  Cognition Comprehension Comprehension assist level: Understands basic 90% of the time/cues < 10% of the time  Expression Expression assist level: Expresses basic 90% of the time/requires cueing < 10% of the time.  Social Interaction Social Interaction assist level: Interacts appropriately 90% of the time - Needs monitoring or encouragement for participation or interaction.  Problem Solving Problem solving assist level: Solves complex 90% of the time/cues < 10% of the time  Memory Memory assist level: Complete Independence: No helper   Medical Problem List and Plan: 1.Decreased functional mobilitysecondary to right trimalleolar ankle fracture status post ORIF 04/23/2018. Hx of spina bifida. Nonweightbearing.  Cont CIR, team conference today  -right ankle XR stable, surgical site in alignment   -Prop pillows along right leg to limit external rotation while in bed 2. DVT Prophylaxis/Anticoagulation: Subcutaneous Lovenox.     Vascular study negative and limited 3. Pain Management:Cymbalta 30 mg nightly, hydrocodone Robaxin and Advil as needed. 4. Mood:Provide emotional support 5. Neuropsych: This patientiscapable of making decisions on herown behalf. 6. Skin/Wound Care:continue routine skin care/prevention 7. Fluids/Electrolytes/Nutrition:encourage PO  -I personally reviewed the patient's labs  today.  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 Patient did use electric scooter for long distances. 10.Constipation. Laxative assistance 11.Obesity. BMI 52.99. Follow-up dietary 12. ABLA:   hgb 11.8 on 5/31----> up to 12.2     -continue to encourage adequate nutrition       LOS (Days) 9 A FACE TO FACE EVALUATION WAS PERFORMED  Ranelle OysterZachary T Rhandi Despain, MD 05/08/2018 8:34 AM

## 2018-05-08 NOTE — Progress Notes (Signed)
Occupational Therapy Session Note  Patient Details  Name: [name] MRN: [id number] Date of Birth: [birth year]  Today's Date: 05/08/2018 OT Group Time: 1100-1200 OT Group Time Calculation (min): 60 min   Skilled Therapeutic Interventions/Progress Updates:    Pt participated in therapeutic w/c level dance group with focus on UE/LE strengthening, activity tolerance, and social participation for carryover during self care tasks. Pt was guided through various dance-based exercises involving UB/LB and trunk. Increased time for lifting R LE, however she was able to do so without UE support. Pt provided emotional support to other group members, requested songs, and actively participated in all exercises, taking rest breaks as needed. At end of session pt was escorted back to room by RT.  Therapy Documentation Precautions:  Precautions Precautions: Fall Precaution Comments: s/p ankle ORIF Restrictions Weight Bearing Restrictions: Yes RLE Weight Bearing: Non weight bearing ADL:      See Function Navigator for Current Functional Status.   Therapy/Group: Group Therapy  Khani Paino A Jullian Previti 05/08/2018, 12:50 PM

## 2018-05-08 NOTE — Progress Notes (Signed)
Physical Therapy Session Note  Patient Details  Name: [name] MRN: [id number] Date of Birth: 11-03-1971  Today's Date: 05/08/2018 PT Individual Time: 7207-2182 PT Individual Time Calculation (min): 44 min   Short Term Goals: Week 2:  =LTGs due to ELOS  Skilled Therapeutic Interventions/Progress Updates:   Pt in w/c and agreeable to therapy, denies pain but reports fatigue. Session focused on LE strengthening and standing balance and standing tolerance for carryover to performing functional tasks in standing upon d/c. Able to tolerate static stance for 45-60 sec at a time while performing unilateral UE reaching task. Close supervision for safety, but no overt LOB.  Performed 4 stands in total w/ min guard and brief rest in between. Performed BLE strengthening exercises in seated including LAQs 2x10, resisted heel slides 2x10 w/ orange TB, and standing L knee bends w/ UE support. Returned to room and ended session in supine, call bell within reach and all needs met. RLE propped up on pillows.   Therapy Documentation Precautions:  Precautions Precautions: Fall Precaution Comments: s/p ankle ORIF Restrictions Weight Bearing Restrictions: Yes RLE Weight Bearing: Non weight bearing  See Function Navigator for Current Functional Status.   Therapy/Group: Individual Therapy  Consuelo Thayne K Arnette 05/08/2018, 2:45 PM

## 2018-05-08 NOTE — Progress Notes (Signed)
Occupational Therapy Session Note  Patient Details  Name: [name] MRN: [id number] Date of Birth: 1971/10/01  Today's Date: 05/08/2018 OT Individual Time: 0916-1000 OT Individual Time Calculation (min): 44 min    Short Term Goals: Week 2:  OT Short Term Goal 1 (Week 2): STG = LTG due to remaining LOS  Skilled Therapeutic Interventions/Progress Updates:    1:1. No pain reported. Pt propels w/c to/from all tx destinations with MOD I for BUE endurance. Pt completes 2 rounds of simulated toileting with clothing management couch<>DAC using squat pivot and RW for clothing mangment only. Pt requires 1 Vc for not stabilizing on waRW with squat pivot back to couch d/t RW potentially flipping. Pt able to completes second round of toileting with no VC/assistance. Pt completes 1x15 excersises with demonstration cues for technique to strengthen BUE required for BADls and transfers  Therapy Documentation Precautions:  Precautions Precautions: Fall Precaution Comments: s/p ankle ORIF Restrictions Weight Bearing Restrictions: Yes RLE Weight Bearing: Non weight bearing General:    See Function Navigator for Current Functional Status.   Therapy/Group: Individual Therapy  [name] 05/08/2018, 10:00 AM

## 2018-05-09 ENCOUNTER — Inpatient Hospital Stay (HOSPITAL_COMMUNITY): Payer: Self-pay

## 2018-05-09 NOTE — Progress Notes (Signed)
Physical Therapy Session Note  Patient Details  Name: [name] MRN: [id number] Date of Birth: [birth year]  Today's Date: 05/09/2018 PT Individual Time: 1030-1135 PT Individual Time Calculation (min): 65 min   Short Term Goals: Week 1:  PT Short Term Goal 1 (Week 1): STG = LTG due to short ELOS. Week 2: LTGs    Skilled Therapeutic Interventions/Progress Updates:   Pt reported muscle soreness bil UEs and LLE and hip due to therapies.  PT educated pt about lactic acid build-up and strategies for decreasing muscle soreness.  W/c propulsion for activity tolerance and UE muscular endurance, using bil UEs,  on level tile, supervision, with cues for turns.  LLe self stretching hamstrings in sitting, x 30 seconds x 3.  Strengthening LLE using Kinetron unilaterally, x 20 cycles x 2 at resistance 20 cm/sec. 3# L ankle wt for : Seated L hip flexion x 20, L long arc quad knee extension, L calf raises with mirror feedback to ensure ankle alignment, bil hip adductor squeezes x 15   Discussed pt's report of LL discrepancy, L longer than R, and tendency for L ankle to over- supinate.  PT educated pt about shoes which control supination, and/or contacting [facility] after RLE has healed to pursue custom shoe inserts. Pt currently uses soft Skecher shoes which do not control oversupination.   Squat pivot transfers w/c>< low love seat in ADL apt, close supervision. Standing tolerance in //, pulling up iwht 1 hand, pushing from armrest with other hand; pt tolerated x 1 minute,rested, x 2 min 15 seconds.  Limited by UE soreness or LLE fatigue..  Pt left resting in bed with all needs within reach. PT educated pt about use of rolled bedspread to block RLE from external rotation when in bed.     Therapy Documentation Precautions:  Precautions Precautions: Fall Precaution Comments: s/p ankle ORIF Restrictions Weight Bearing Restrictions: Yes RLE Weight Bearing: Non weight bearing  Pain: none per  pt      See Function Navigator for Current Functional Status.   Therapy/Group: Individual Therapy  Lashaya Kienitz 05/09/2018, 12:44 PM

## 2018-05-09 NOTE — Progress Notes (Signed)
[place] PHYSICAL MEDICINE & REHABILITATION     PROGRESS NOTE    Subjective/Complaints: No new problems.  Continues to gradually progress  ROS: Patient denies fever, rash, sore throat, blurred vision, nausea, vomiting, diarrhea, cough, shortness of breath or chest pain, joint or back pain, headache, or mood change.   Objective:  No results found. Recent Labs    05/07/18 0739  WBC 5.3  HGB 12.2  HCT 38.1  PLT 386   Recent Labs    05/07/18 0739  NA 141  K 3.6  CL 106  GLUCOSE 111*  BUN 15  CREATININE 0.88  CALCIUM 9.0   CBG (last 3)  No results for input(s): GLUCAP in the last 72 hours.  Wt Readings from Last 3 Encounters:  04/29/18 121.6 kg (268 lb 1.3 oz)  04/23/18 123.1 kg (271 lb 5 oz)  04/12/18 122.5 kg (270 lb)     Intake/Output Summary (Last 24 hours) at 05/09/2018 0817 Last data filed at 05/08/2018 1300 Gross per 24 hour  Intake 240 ml  Output -  Net 240 ml    Vital Signs: Blood pressure (!) 132/91, pulse 82, temperature 97.6 F (36.4 C), temperature source Oral, resp. rate 20, height 5' (1.524 m), weight 121.6 kg (268 lb 1.3 oz), SpO2 98 %. Physical Exam:  Constitutional: No distress . Vital signs reviewed. HEENT: EOMI, oral membranes moist Neck: supple Cardiovascular: RRR without murmur. No JVD    Respiratory: CTA Bilaterally without wheezes or rales. Normal effort    GI: BS +, non-tender, non-distended  Musc: Lymphedema b/l LE. Right LLE splint in place, leg externally rotated Neurological: She isalertand oriented Follows commands Motor: 5/5 B/l UE LLE 4/5 proximal to distal RLE 4/5 HF, KE, wiggles toes right foot --motor exam stable Skin: pink and warm Psych: very pleasant and cooperative  Assessment/Plan: 1. Impaired functional mobility  secondary to right trimalleolar ankle fracture which require 3+ hours per day of interdisciplinary therapy in a comprehensive inpatient rehab setting. Physiatrist is providing close team supervision  and 24 hour management of active medical problems listed below. Physiatrist and rehab team continue to assess barriers to discharge/monitor patient progress toward functional and medical goals.  Function:  Bathing Bathing position   Position: Sitting EOB  Bathing parts Body parts bathed by patient: Right arm, Left arm, Chest, Abdomen, Front perineal area, Buttocks, Right upper leg, Left upper leg Body parts bathed by helper: Buttocks, Back  Bathing assist Assist Level: Set up, Supervision or verbal cues   Set up : To obtain items  Upper Body Dressing/Undressing Upper body dressing   What is the patient wearing?: Pull over shirt/dress Bra - Perfomed by patient: Thread/unthread left bra strap, Thread/unthread right bra strap, Hook/unhook bra (pull down sports bra)   Pull over shirt/dress - Perfomed by patient: Thread/unthread right sleeve, Thread/unthread left sleeve, Put head through opening, Pull shirt over trunk          Upper body assist Assist Level: Set up   Set up : To obtain clothing/put away  Lower Body Dressing/Undressing Lower body dressing   What is the patient wearing?: Pants     Pants- Performed by patient: Pull pants up/down Pants- Performed by helper: Thread/unthread right pants leg, Thread/unthread left pants leg     Socks - Performed by patient: Don/doff right sock(sock aide)     Shoes - Performed by helper: Don/doff left shoe, Fasten right          Lower body assist Assist for lower  body dressing: Touching or steadying assistance (Pt > 75%) Assistive Device Comment: Chief of Staff   Toileting steps completed by patient: Adjust clothing prior to toileting, Performs perineal hygiene, Adjust clothing after toileting Toileting steps completed by helper: Adjust clothing after toileting Toileting Assistive Devices: Toilet aid  Toileting assist Assist level: More than reasonable time   Transfers Chair/bed transfer   Chair/bed transfer  method: Stand pivot Chair/bed transfer assist level: Touching or steadying assistance (Pt > 75%) Chair/bed transfer assistive device: Armrests, Patent attorney Ambulation activity did not occur: Safety/medical Investment banker, operational activity did not occur: Safety/medical concerns Type: Manual Max wheelchair distance: 150' Assist Level: No help, No cues, assistive device, takes more than reasonable amount of time  Cognition Comprehension Comprehension assist level: Understands basic 90% of the time/cues < 10% of the time  Expression Expression assist level: Expresses basic 90% of the time/requires cueing < 10% of the time.  Social Interaction Social Interaction assist level: Interacts appropriately 90% of the time - Needs monitoring or encouragement for participation or interaction.  Problem Solving Problem solving assist level: Solves complex 90% of the time/cues < 10% of the time  Memory Memory assist level: Complete Independence: No helper   Medical Problem List and Plan: 1.Decreased functional mobilitysecondary to right trimalleolar ankle fracture status post ORIF 04/23/2018. Hx of spina bifida. Nonweightbearing.  Cont CIR, team conference today  -right ankle XR stable, surgical site in alignment   -Prop pillows along right leg to limit external rotation while in bed 2. DVT Prophylaxis/Anticoagulation: Subcutaneous Lovenox.     Vascular study negative and limited 3. Pain Management:Cymbalta 30 mg nightly, hydrocodone Robaxin and Advil as needed. 4. Mood:Provide emotional support 5. Neuropsych: This patientiscapable of making decisions on herown behalf. 6. Skin/Wound Care:continue routine skin care/prevention 7. 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 Patient did use electric scooter for long  distances. 10.Constipation. Laxative assistance 11.Obesity. 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 Follow-up dietary 12. ABLA:   hgb 11.8 on 5/31----> up to 12.2     -continue to encourage adequate nutrition       LOS (Days) 10 A FACE TO FACE EVALUATION WAS PERFORMED  Ranelle Oyster, MD 05/09/2018 8:17 AM

## 2018-05-09 NOTE — Progress Notes (Signed)
Occupational Therapy Session Note  Patient Details  Name: [name] MRN: [id number] Date of Birth: Sep 29, 1971  Today's Date: 05/09/2018 OT Individual Time: 9967-2277 OT Individual Time Calculation (min): 25 min    Short Term Goals: Week 1:  OT Short Term Goal 1 (Week 1): STG=LTGs d/t ELOS OT Short Term Goal 1 - Progress (Week 1): Progressing toward goal  Skilled Therapeutic Interventions/Progress Updates:    1:1. Pt agreeable to OT tx with no c/o pain. Pt completes squat pivot transfer EOB>w/c with supervision. Pt propels w/c to/from ADL apartments with no breaks to improve endurance. Pt able to direct OT where to place commode next to couch without cueing and set up for w/c>couch>BSC transfer with only A to remove leg rests. Pt completes simulated toileting/transfer from [facility] next to couch standing at [place] for clothing management with no A or cues. Pt reporting feeling more comfortable and confident with set up in prep for home. Exited session with pt seated in w/c  In room with call light in reach and all needs met  Therapy Documentation Precautions:  Precautions Precautions: Fall Precaution Comments: s/p ankle ORIF Restrictions Weight Bearing Restrictions: Yes RLE Weight Bearing: Non weight bearing General:   Vital Signs: Therapy Vitals Temp: 97.6 F (36.4 C) Temp Source: Oral Pulse Rate: 82 Resp: 20 BP: (!) 132/91 Patient Position (if appropriate): Sitting Oxygen Therapy SpO2: 98 % O2 Device: Room Air  See Function Navigator for Current Functional Status.   Therapy/Group: Individual Therapy  Tonny Branch 05/09/2018, 8:55 AM

## 2018-05-10 ENCOUNTER — Inpatient Hospital Stay (HOSPITAL_COMMUNITY): Payer: No Typology Code available for payment source

## 2018-05-10 ENCOUNTER — Inpatient Hospital Stay (HOSPITAL_COMMUNITY): Payer: Self-pay

## 2018-05-10 ENCOUNTER — Inpatient Hospital Stay (HOSPITAL_COMMUNITY): Payer: Self-pay | Admitting: Physical Therapy

## 2018-05-10 NOTE — Progress Notes (Signed)
Orthopedic Tech Progress Note Patient Details:  [name] 1971/10/22 [id number] Replacement splint. Ortho Devices Type of Ortho Device: Ace wrap, Post (short leg) splint, Stirrup splint Ortho Device/Splint Location: RLE Ortho Device/Splint Interventions: Ordered, Application   Post Interventions Patient Tolerated: Well Instructions Provided: Care of device   [name], [name] 05/10/2018, 3:08 PM

## 2018-05-10 NOTE — Progress Notes (Signed)
Occupational Therapy Session Note  Patient Details  Name: [name] MRN: [id number] Date of Birth: 1971/11/04  Today's Date: 05/10/2018 OT Individual Time: 9147-82950930-1055 OT Individual Time Calculation (min): 85 min    Short Term Goals: Week 2:  OT Short Term Goal 1 (Week 2): STG = LTG due to remaining LOS  Skilled Therapeutic Interventions/Progress Updates:    OT intervention with focus on bed mobility, functional transfers, BUE strengthening, and standing balance to increase independence with BADLs.  Pt propelled w/c to day room and initially engaged in BUE therex on [employer] (8 min and 6 mins random at work load 3). Pt transitioned to hi-lo table for standing activities.  Pt completed 4 games of Connect 4 with rest breaks between each game.  Pt completed all tasks at supervision level.  Pt propelled w/c to gym and back to room without a rest break.  Pt remained in w/c with all needs within reach.   Therapy Documentation Precautions:  Precautions Precautions: Fall Precaution Comments: s/p ankle ORIF Restrictions Weight Bearing Restrictions: Yes RLE Weight Bearing: Non weight bearing  Pain: Pain Assessment Pain Scale: 0-10 Pain Score: 0-No pain  See Function Navigator for Current Functional Status.   Therapy/Group: Individual Therapy  [name], Rice Walsh Chappell 05/10/2018, 11:02 AM

## 2018-05-10 NOTE — Progress Notes (Signed)
[place] PHYSICAL MEDICINE & REHABILITATION     PROGRESS NOTE    Subjective/Complaints: Pt states she was a little restless last night. Did not sleep her best. Doesn't really no why. Otherwise doing well  ROS: Patient denies fever, rash, sore throat, blurred vision, nausea, vomiting, diarrhea, cough, shortness of breath or chest pain, joint or back pain, headache, or mood change.   Objective:  No results found. No results for input(s): WBC, HGB, HCT, PLT in the last 72 hours. No results for input(s): NA, K, CL, GLUCOSE, BUN, CREATININE, CALCIUM in the last 72 hours.  Invalid input(s): CO CBG (last 3)  No results for input(s): GLUCAP in the last 72 hours.  Wt Readings from Last 3 Encounters:  04/29/18 121.6 kg (268 lb 1.3 oz)  04/23/18 123.1 kg (271 lb 5 oz)  04/12/18 122.5 kg (270 lb)     Intake/Output Summary (Last 24 hours) at 05/10/2018 1042 Last data filed at 05/10/2018 0800 Gross per 24 hour  Intake 710 ml  Output -  Net 710 ml    Vital Signs: Blood pressure (!) 101/50, pulse 78, temperature 98 F (36.7 C), temperature source Oral, resp. rate 18, height 5' (1.524 m), weight 121.6 kg (268 lb 1.3 oz), SpO2 99 %. Physical Exam:  Constitutional: No distress . Vital signs reviewed. HEENT: EOMI, oral membranes moist Neck: supple Cardiovascular: RRR without murmur. No JVD    Respiratory: CTA Bilaterally without wheezes or rales. Normal effort    GI: BS +, non-tender, non-distended  Musc: Lymphedema b/l LE. Right LLE splint in place, leg externally rotated Neurological: She isalertand oriented Follows commands Motor: 5/5 B/l UE LLE 4/5 proximal to distal RLE 4/5 HF, KE, wiggles toes right foot --motor exam stable Skin: pink and warm Psych: very pleasant and cooperative  Assessment/Plan: 1. Impaired functional mobility  secondary to right trimalleolar ankle fracture which require 3+ hours per day of interdisciplinary therapy in a comprehensive inpatient rehab  setting. Physiatrist is providing close team supervision and 24 hour management of active medical problems listed below. Physiatrist and rehab team continue to assess barriers to discharge/monitor patient progress toward functional and medical goals.  Function:  Bathing Bathing position   Position: Sitting EOB  Bathing parts Body parts bathed by patient: Right arm, Left arm, Chest, Abdomen, Front perineal area, Buttocks, Right upper leg, Left upper leg Body parts bathed by helper: Buttocks, Back  Bathing assist Assist Level: Set up, Supervision or verbal cues   Set up : To obtain items  Upper Body Dressing/Undressing Upper body dressing   What is the patient wearing?: Pull over shirt/dress Bra - Perfomed by patient: Thread/unthread left bra strap, Thread/unthread right bra strap, Hook/unhook bra (pull down sports bra)   Pull over shirt/dress - Perfomed by patient: Thread/unthread right sleeve, Thread/unthread left sleeve, Put head through opening, Pull shirt over trunk          Upper body assist Assist Level: Set up   Set up : To obtain clothing/put away  Lower Body Dressing/Undressing Lower body dressing   What is the patient wearing?: Pants     Pants- Performed by patient: Pull pants up/down Pants- Performed by helper: Thread/unthread right pants leg, Thread/unthread left pants leg     Socks - Performed by patient: Don/doff right sock(sock aide)     Shoes - Performed by helper: Don/doff left shoe, Fasten right          Lower body assist Assist for lower body dressing: Touching or steadying  assistance (Pt > 75%) Assistive Device Comment: Chief of Staffreacher    Toileting Toileting   Toileting steps completed by patient: Adjust clothing prior to toileting, Performs perineal hygiene, Adjust clothing after toileting Toileting steps completed by helper: Adjust clothing after toileting Toileting Assistive Devices: Toilet aid  Toileting assist Assist level: More than reasonable  time   Transfers Chair/bed transfer   Chair/bed transfer method: Stand pivot Chair/bed transfer assist level: Touching or steadying assistance (Pt > 75%) Chair/bed transfer assistive device: Armrests, Patent attorneyWalker     Locomotion Ambulation Ambulation activity did not occur: Safety/medical Investment banker, operationalconcerns         Wheelchair Wheelchair activity did not occur: Safety/medical concerns Type: Manual Max wheelchair distance: 150' Assist Level: No help, No cues, assistive device, takes more than reasonable amount of time  Cognition Comprehension Comprehension assist level: Understands basic 90% of the time/cues < 10% of the time  Expression Expression assist level: Expresses basic 90% of the time/requires cueing < 10% of the time.  Social Interaction Social Interaction assist level: Interacts appropriately 90% of the time - Needs monitoring or encouragement for participation or interaction.  Problem Solving Problem solving assist level: Solves complex 90% of the time/cues < 10% of the time  Memory Memory assist level: Complete Independence: No helper   Medical Problem List and Plan: 1.Decreased functional mobilitysecondary to right trimalleolar ankle fracture status post ORIF 04/23/2018. Hx of spina bifida. Nonweightbearing.  Cont CIR, ELOS 6/18  -right ankle XR stable, surgical site in alignment   -Propping pillows along right leg to limit external rotation while in bed 2. DVT Prophylaxis/Anticoagulation: Subcutaneous Lovenox.     Vascular study negative and limited 3. Pain Management:Cymbalta 30 mg nightly, hydrocodone Robaxin and Advil as needed. 4. Mood:Provide emotional support 5. Neuropsych: This patientiscapable of making decisions on herown behalf. 6. Skin/Wound Care:continue routine skin care/prevention 7. 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 Patient did use electric scooter for long distances. 10.Constipation. Laxative assistance 11.Obesity. BMI 52.99. Follow-up dietary 12. ABLA:   hgb 11.8 on 5/31----> up to 12.2     -continue to encourage adequate nutrition       LOS (Days) 11 A FACE TO FACE EVALUATION WAS PERFORMED  Ranelle OysterZachary T Swartz, MD 05/10/2018 10:42 AM

## 2018-05-10 NOTE — Progress Notes (Signed)
Physical Therapy Session Note  Patient Details  Name: [name] MRN: [id number] Date of Birth: 1971-01-08  Today's Date: 05/10/2018 PT Individual Time: 1300-1330 PT Individual Time Calculation (min): 30 min   Short Term Goals: Week 1:  PT Short Term Goal 1 (Week 1): STG = LTG due to short ELOS. Week 2:   LTGs  Skilled Therapeutic Interventions/Progress Updates:   Pt was upset; she reported that her surgeon saw her this AM and stated that the R ankle is not healing with appropriate alignment.  Surgeon informed her that she has osteopenia. No new pertinent orders.  She sees the surgeon 2 days after d/c from CIR.    Seated L ankle eversion with flexed knee, with extended knee, x 15 each; L ankle PF in wt bearing, focusing on avoiding over supination, bil glut sets, bil adductor squeezes.  Pt left resting in w/c with needs at hand.    Therapy Documentation Precautions:  Precautions Precautions: Fall Precaution Comments: s/p ankle ORIF Restrictions Weight Bearing Restrictions: Yes RLE Weight Bearing: Non weight bearing   Pain: none per pt    See Function Navigator for Current Functional Status.   Therapy/Group: Individual Therapy  [name] 05/10/2018, 2:29 PM

## 2018-05-10 NOTE — Plan of Care (Signed)
  Problem: Consults Goal: RH GENERAL PATIENT EDUCATION Description See Patient Education module for education specifics. Outcome: Progressing Goal: Skin Care Protocol Initiated - if Braden Score 18 or less Description If consults are not indicated, leave blank or document N/A Outcome: Progressing   Problem: RH SKIN INTEGRITY Goal: RH STG SKIN FREE OF INFECTION/BREAKDOWN Description Skin to remain free from infection and breakdown with mod assist while on rehab.  Outcome: Progressing Goal: RH STG MAINTAIN SKIN INTEGRITY WITH ASSISTANCE Description STG Maintain Skin Integrity With mod Assistance.  Outcome: Progressing   Problem: RH SAFETY Goal: RH STG ADHERE TO SAFETY PRECAUTIONS W/ASSISTANCE/DEVICE Description STG Adhere to Safety Precautions With min Assistance appropriate assistive Device.  Outcome: Progressing Goal: RH STG DECREASED RISK OF FALL WITH ASSISTANCE Description STG Decreased Risk of Fall With min Assistance and maintaining NWB status on the RLE.  Outcome: Progressing   Problem: RH PAIN MANAGEMENT Goal: RH STG PAIN MANAGED AT OR BELOW PT'S PAIN GOAL Description <3 on a 0-10 pain scale  Outcome: Progressing   Problem: RH KNOWLEDGE DEFICIT GENERAL Goal: RH STG INCREASE KNOWLEDGE OF SELF CARE AFTER HOSPITALIZATION Description Patient will be able to demonstrate knowledge of medications, weight bearing status of RLE, and follow up care with the MD post discharge with min assist from rehab staff.  Outcome: Progressing

## 2018-05-10 NOTE — Progress Notes (Signed)
Physical Therapy Session Note  Patient Details  Name: [name] MRN: [id number] Date of Birth: Jan 23, 1971  Today's Date: 05/10/2018 PT Individual Time: 1100-1200 PT Individual Time Calculation (min): 60 min   Short Term Goals: Week 2:    STGs = LTGs due to length of stay  Skilled Therapeutic Interventions/Progress Updates: Pt presented in w/c agreeable to therapy. Session focused on functional transfers. Pt propelled to ADL apt for endurance and UE strengthening. Participated in squat pivot transfers with pt providing verbal instructions to PTA on set up of w/c and BSC. Performed w/c to/from couch and couch to/from BSC x 4 with Min guard and pt verbalizing sequencing. Pt returned to w/c and transported back to room to transfer to scooter. Performed squat pivot transfer to scooter with set up and used scooter to ortho gym to perform car transfer. Pt performed car transfer x 3 with min A for setting up SB. Pt required increased time for scooter setup. Pt transported to rehab gym and performed sit to stand with min guard and maintained standing balance while using peg board, pt required single UE support in standing and maintained standing balance approx 2 min before fatigue. Pt returned to room and performed squat pivot return to w/c in same manner as prior. Pt remained in w/c at end of session with call bell within reach and needs met.      Therapy Documentation Precautions:  Precautions Precautions: Fall Precaution Comments: s/p ankle ORIF Restrictions Weight Bearing Restrictions: Yes RLE Weight Bearing: Non weight bearing General:   Vital Signs:  Pain: Pain Assessment Pain Scale: 0-10 Pain Score: 0-No pain   See Function Navigator for Current Functional Status.   Therapy/Group: Individual Therapy  Azusena Erlandson  Cleopha Indelicato, PTA  05/10/2018, 12:29 PM

## 2018-05-10 NOTE — Progress Notes (Signed)
Occupational Therapy Session Note  Patient Details  Name: [name] MRN: [id number] Date of Birth: [birth year]  Today's Date: 05/10/2018 OT Individual Time: 1400-1430 OT Individual Time Calculation (min): 30 min    Short Term Goals: Week 1:  OT Short Term Goal 1 (Week 1): STG=LTGs d/t ELOS OT Short Term Goal 1 - Progress (Week 1): Progressing toward goal  Skilled Therapeutic Interventions/Progress Updates:    OT intervention with focus on functional transfers and dynamic standing activities to increase independence with BADLs. Pt performed w/c<>sofa transfers at supervision level.  Pt performed sit<>stand from sofa X 5 at supervision level and completed standing tasks at bedside table-folding laundry and completing pipe tree tasks.  Pt propelled w/c from ADL apartment to room without a break to increase endurance and BUE strength.  Pt remained in w/c with all needs within reach.   Therapy Documentation Precautions:  Precautions Precautions: Fall Precaution Comments: s/p ankle ORIF Restrictions Weight Bearing Restrictions: Yes RLE Weight Bearing: Non weight bearing  Pain:  Pt denies pain  See Function Navigator for Current Functional Status.   Therapy/Group: Individual Therapy  [name], [name] 05/10/2018, 2:33 PM

## 2018-05-10 NOTE — Progress Notes (Signed)
Resting w/o complaint of discomfort throughout shift, denies need fo pain medication,. Continue medical regime and monitoring.Call bell within reach, bed alarm in place. Refer to assessment data

## 2018-05-11 ENCOUNTER — Inpatient Hospital Stay (HOSPITAL_COMMUNITY): Payer: Self-pay

## 2018-05-11 ENCOUNTER — Inpatient Hospital Stay (HOSPITAL_COMMUNITY): Payer: Self-pay | Admitting: Physical Therapy

## 2018-05-11 ENCOUNTER — Inpatient Hospital Stay (HOSPITAL_COMMUNITY): Payer: Self-pay | Admitting: Occupational Therapy

## 2018-05-11 NOTE — Progress Notes (Signed)
Physical Therapy Discharge Summary  Patient Details  Name: [name] MRN: [id number] Date of Birth: 06-22-1971  Today's Date: 05/11/2018 PT Individual Time: 1300-1400 PT Individual Time Calculation (min): 60 min    Patient has met 6 of 6 long term goals due to improved activity tolerance, improved balance, improved postural control, increased strength and improved awareness.  Patient to discharge at a wheelchair level mod I, supervision for car transfers with slide board.   Recommendation:  Patient will benefit from ongoing skilled PT services in home health setting to continue to advance safe functional mobility, address ongoing impairments in strength, balance, and safety awareness, and minimize fall risk.  Equipment: RW, slide board  Reasons for discharge: treatment goals met  Patient/family agrees with progress made and goals achieved: Yes   Skilled PT Intervention: No c/o pain.  Session focus on d/c assessment, d/c education, and standing tolerance.  Pt able to transfer throughout session with mod I overall, supervision for car transfer with slide board.  Standing tolerance x3 trials of connect 4 (2 rounds +1 round +1 round) with RW focus on alternating UE support and maintaining NWB on RLE.  Discussed safety with sit<>stands, furniture/car transfers, and w/c positioning.  Pt returned to room at end of session and positioned back to bed (mod I with slide board), mod I for sit>supine.  Call bell in reach and needs met.   PT Discharge Precautions/Restrictions Precautions Precautions: Fall Precaution Comments: s/p ankle ORIF, NWB Restrictions Weight Bearing Restrictions: Yes RLE Weight Bearing: Non weight bearing Pain Pain Assessment Pain Scale: 0-10 Vision/Perception  Perception Perception: Within Functional Limits Praxis Praxis: Intact  Cognition Overall Cognitive Status: Within Functional Limits for tasks assessed Arousal/Alertness: Awake/alert Orientation  Level: Oriented X4 Memory: Appears intact Awareness: Appears intact Problem Solving: Appears intact Safety/Judgment: Appears intact Sensation Sensation Light Touch: Impaired Detail(L side WNL, R side absent on dorsum of foot and decreased on big toe) Peripheral sensation comments: L side WNL, R side absent on dorsum of foot and decreased on big toe Hot/Cold: Appears Intact Proprioception: Appears Intact Stereognosis: Appears Intact Coordination Gross Motor Movements are Fluid and Coordinated: Yes Fine Motor Movements are Fluid and Coordinated: Yes Motor  Motor Motor: Within Functional Limits  Mobility Bed Mobility Bed Mobility: Sit to Supine;Supine to Sit Supine to Sit: Independent with assistive device Sit to Supine: Independent with assistive device Transfers Transfers: Transfer Sit to Stand: Supervision/Verbal cueing Stand to Sit: Supervision/Verbal cueing Squat Pivot Transfers: Independent with assistive device Lateral/Scoot Transfers: Independent with assistive device Transfer (Assistive device): Other (Comment)(slide board) Locomotion  Gait Ambulation: No Gait Gait: No Stairs / Additional Locomotion Stairs: No Wheelchair Mobility Wheelchair Mobility: Yes Wheelchair Propulsion: Both upper extremities;Power(mod I with both) Wheelchair Parts Management: Needs assistance Distance: 150  Trunk/Postural Assessment  Cervical Assessment Cervical Assessment: Within Functional Limits Thoracic Assessment Thoracic Assessment: Exceptions to WFL(decreased ROM 2/2 previous surgeries) Lumbar Assessment Lumbar Assessment: Within Functional Limits Postural Control Postural Control: Within Functional Limits  Balance Balance Balance Assessed: Yes Dynamic Sitting Balance Dynamic Sitting - Balance Support: No upper extremity supported Dynamic Sitting - Level of Assistance: 6: Modified independent (Device/Increase time) Static Standing Balance Static Standing - Balance  Support: Bilateral upper extremity supported;Left upper extremity supported;Right upper extremity supported Static Standing - Level of Assistance: 6: Modified independent (Device/Increase time) Dynamic Standing Balance Dynamic Standing - Balance Support: Left upper extremity supported;Right upper extremity supported;During functional activity Dynamic Standing - Level of Assistance: 5: Stand by assistance Extremity Assessment  RUE Assessment  RUE Assessment: Within Functional Limits LUE Assessment LUE Assessment: Within Functional Limits RLE Assessment Active Range of Motion (AROM) Comments: WFL, ankle not assessed 2/2 cast General Strength Comments: hip 4/5, knee flexion at least 3/5 but not tested with resistance 2/2 pain in ankle, ankle not assessed 2/2 cast LLE Assessment LLE Assessment: Within Functional Limits Active Range of Motion (AROM) Comments: WFL assessed in sitting General Strength Comments: 4+/5 proximal to distal   See Function Navigator for Current Functional Status.  [name] 05/11/2018, 1:48 PM

## 2018-05-11 NOTE — Progress Notes (Signed)
Occupational Therapy Session Note  Patient Details  Name: [name] MRN: [id number] Date of Birth: Jan 17, 1971  Today's Date: 05/11/2018 OT Individual Time: 1030-1200 OT Individual Time Calculation (min): 90 min    Short Term Goals: Week 1:  OT Short Term Goal 1 (Week 1): STG=LTGs d/t ELOS OT Short Term Goal 1 - Progress (Week 1): Progressing toward goal  Skilled Therapeutic Interventions/Progress Updates:    Pt practiced w/c<>sofa transfers, sofa<>BSC transfers, sit<>stand from sofa/BSC, and standing balance.  Pt completed all tasks without assistance.  Pt directed therapist regarding correct placement of BSC to facilitate independence.  Recommended that pt and husband practice transfers before actual need.  Pt verbalized understanding.  Pt transtioned to day room and engaged in standing activities with Wii bowling to increase standing balance and endurance.  Pt did not require any assistance.  Pt engaged in BUE therex on Scifit (9 mins and 8 mins random at load 4). Pt returned to room and remained in w/c with all needs within reach.   Therapy Documentation Precautions:  Precautions Precautions: Fall Precaution Comments: s/p ankle ORIF Restrictions Weight Bearing Restrictions: Yes RLE Weight Bearing: Non weight bearing Pain:  Pt denies pain  See Function Navigator for Current Functional Status.   Therapy/Group: Individual Therapy  [name], [name] 05/11/2018, 12:20 PM

## 2018-05-11 NOTE — Progress Notes (Signed)
PHYSICAL MEDICINE & REHABILITATION     PROGRESS NOTE    Subjective/Complaints: Pt denies pain. In relatively good spirits. Reviewed her discussion with orthopedic surgeon  ROS: Patient denies fever, rash, sore throat, blurred vision, nausea, vomiting, diarrhea, cough, shortness of breath or chest pain, joint or back pain, headache, or mood change.   Objective:  Dg Ankle Complete Right  Result Date: 05/10/2018 CLINICAL DATA:  Ankle fracture EXAM: RIGHT ANKLE - COMPLETE 3+ VIEW COMPARISON:  Plain film of the RIGHT ankle dated 05/03/2018. FINDINGS: Overlying casting remains in place. Osseous alignment is stable. Fixation hardware is stable in position within the distal tibia and fibula. IMPRESSION: No interval change appreciated. Osseous alignment appears stable. Fixation hardware within the distal RIGHT tibia and fibula appear stable in position. Electronically Signed   By: Bary RichardStan  Maynard M.D.   On: 05/10/2018 16:52   No results for input(s): WBC, HGB, HCT, PLT in the last 72 hours. No results for input(s): NA, K, CL, GLUCOSE, BUN, CREATININE, CALCIUM in the last 72 hours.  Invalid input(s): CO CBG (last 3)  No results for input(s): GLUCAP in the last 72 hours.  Wt Readings from Last 3 Encounters:  04/29/18 121.6 kg (268 lb 1.3 oz)  04/23/18 123.1 kg (271 lb 5 oz)  04/12/18 122.5 kg (270 lb)     Intake/Output Summary (Last 24 hours) at 05/11/2018 0900 Last data filed at 05/10/2018 2230 Gross per 24 hour  Intake 440 ml  Output 200 ml  Net 240 ml    Vital Signs: Blood pressure (!) 98/45, pulse 74, temperature 97.7 F (36.5 C), temperature source Oral, resp. rate 17, height 5' (1.524 m), weight 121.6 kg (268 lb 1.3 oz), SpO2 96 %. Physical Exam:  Constitutional: No distress . Vital signs reviewed. HEENT: EOMI, oral membranes moist Neck: supple Cardiovascular: RRR without murmur. No JVD    Respiratory: CTA Bilaterally without wheezes or rales. Normal effort    GI: BS  +, non-tender, non-distended  Musc: Lymphedema b/l LE. Right LLE splint in place, leg remains sl externally rotated Neurological: She isalertand oriented Follows commands Motor: 5/5 B/l UE LLE 4/5 proximal to distal RLE 4/5 HF, KE, wiggles toes right foot --motor exam stable Skin: pink and warm Psych: very pleasant and cooperative  Assessment/Plan: 1. Impaired functional mobility  secondary to right trimalleolar ankle fracture which require 3+ hours per day of interdisciplinary therapy in a comprehensive inpatient rehab setting. Physiatrist is providing close team supervision and 24 hour management of active medical problems listed below. Physiatrist and rehab team continue to assess barriers to discharge/monitor patient progress toward functional and medical goals.  Function:  Bathing Bathing position   Position: Sitting EOB  Bathing parts Body parts bathed by patient: Right arm, Left arm, Chest, Abdomen, Front perineal area, Buttocks, Right upper leg, Left upper leg Body parts bathed by helper: Buttocks, Back  Bathing assist Assist Level: Set up, Supervision or verbal cues   Set up : To obtain items  Upper Body Dressing/Undressing Upper body dressing   What is the patient wearing?: Pull over shirt/dress Bra - Perfomed by patient: Thread/unthread left bra strap, Thread/unthread right bra strap, Hook/unhook bra (pull down sports bra)   Pull over shirt/dress - Perfomed by patient: Thread/unthread right sleeve, Thread/unthread left sleeve, Put head through opening, Pull shirt over trunk          Upper body assist Assist Level: Set up   Set up : To obtain clothing/put away  Lower Body  Dressing/Undressing Lower body dressing   What is the patient wearing?: Pants     Pants- Performed by patient: Pull pants up/down Pants- Performed by helper: Thread/unthread right pants leg, Thread/unthread left pants leg     Socks - Performed by patient: Don/doff right sock(sock aide)      Shoes - Performed by helper: Don/doff left shoe, Fasten right          Lower body assist Assist for lower body dressing: Touching or steadying assistance (Pt > 75%) Assistive Device Comment: Chief of Staff   Toileting steps completed by patient: Adjust clothing prior to toileting, Performs perineal hygiene, Adjust clothing after toileting Toileting steps completed by helper: Adjust clothing after toileting Toileting Assistive Devices: Toilet aid  Toileting assist Assist level: More than reasonable time   Transfers Chair/bed transfer   Chair/bed transfer method: Stand pivot, Squat pivot Chair/bed transfer assist level: Touching or steadying assistance (Pt > 75%) Chair/bed transfer assistive device: Armrests, Patent attorney Ambulation activity did not occur: Safety/medical Investment banker, operational activity did not occur: Safety/medical concerns Type: Manual Max wheelchair distance: 150' Assist Level: No help, No cues, assistive device, takes more than reasonable amount of time  Cognition Comprehension Comprehension assist level: Understands basic 90% of the time/cues < 10% of the time  Expression Expression assist level: Expresses basic 90% of the time/requires cueing < 10% of the time.  Social Interaction Social Interaction assist level: Interacts appropriately 90% of the time - Needs monitoring or encouragement for participation or interaction.  Problem Solving Problem solving assist level: Solves complex 90% of the time/cues < 10% of the time  Memory Memory assist level: Complete Independence: No helper   Medical Problem List and Plan: 1.Decreased functional mobilitysecondary to right trimalleolar ankle fracture status post ORIF 04/23/2018. Hx of spina bifida. Nonweightbearing.  Cont CIR,    -orthopedics has indicated that she might need further surgery on her ankle given misalignment but that it was a risk due to her  osteopenia. Follow up xray performed last night again shows alignment of fracture/operative area. Pain is under control. Splint in place. She will see ortho on Friday to determine future plan. Neither I or PA was contacted by orthopedics yesterday regarding this issue. Continue with dc tomorrow as planned.    2. DVT Prophylaxis/Anticoagulation: Subcutaneous Lovenox.     Vascular study negative and limited 3. Pain Management:Cymbalta 30 mg nightly, hydrocodone Robaxin and Advil as needed. 4. Mood:Provide emotional support 5. Neuropsych: This patientiscapable of making decisions on herown behalf. 6. Skin/Wound Care:continue routine skin care/prevention 7. 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 Patient did use electric scooter for long distances. 10.Constipation. Laxative assistance 11.Obesity. BMI 52.99. Follow-up dietary 12. ABLA:   hgb 11.8 on 5/31----> up to 12.2     -continue to encourage adequate nutrition       LOS (Days) 12 A FACE TO FACE EVALUATION WAS PERFORMED  Ranelle Oyster, MD 05/11/2018 9:00 AM

## 2018-05-11 NOTE — Progress Notes (Signed)
Occupational Therapy Discharge Summary  Patient Details  Name: [name] MRN: [id number] Date of Birth: August 09, 1971  Patient has met 8 of 9 long term goals due to improved activity tolerance, improved balance and ability to compensate for deficits.  Pt made steady progress with BADLs during this admission.  Pt completes bathing/dressing tasks seated EOB and plans to complete tasks at home while seated on sofa.  Pt completes BADLs at mod I level.  Pt performs sofa<>BSC transfers at mod I level.  Pt will not be able to access bathroom at home. Pt stated her husband will prepare all meals. Patient to discharge at overall Modified Independent level.  Patient's care partner is independent to provide the necessary physical assistance at discharge.      Recommendation:  Patient will benefit from ongoing skilled OT services in home health setting to continue to advance functional skills in the area of BADL.  Equipment: Drop Arm BSC, tub transfer bench  Reasons for discharge: treatment goals met  Patient/family agrees with progress made and goals achieved: Yes  OT Discharge Vision Baseline Vision/History: Wears glasses Wears Glasses: Reading only Patient Visual Report: No change from baseline Vision Assessment?: No apparent visual deficits Cognition Overall Cognitive Status: Within Functional Limits for tasks assessed Arousal/Alertness: Awake/alert Orientation Level: Oriented X4 Memory: Appears intact Awareness: Appears intact Safety/Judgment: Appears intact Sensation Sensation Light Touch: Appears Intact Hot/Cold: Appears Intact Proprioception: Appears Intact Stereognosis: Appears Intact Coordination Gross Motor Movements are Fluid and Coordinated: Yes Fine Motor Movements are Fluid and Coordinated: Yes    Trunk/Postural Assessment  Cervical Assessment Cervical Assessment: Within Functional Limits Thoracic Assessment Thoracic Assessment: Exceptions to WFL(decreased ROM  secondary to previous surgeries) Lumbar Assessment Lumbar Assessment: Exceptions to WFL(posterior pelvic tilt) Postural Control Postural Control: Within Functional Limits  Balance Dynamic Sitting Balance Dynamic Sitting - Level of Assistance: 6: Modified independent (Device/Increase time) Extremity/Trunk Assessment RUE Assessment RUE Assessment: Within Functional Limits LUE Assessment LUE Assessment: Within Functional Limits   See Function Navigator for Current Functional Status.  Leotis Shames [facility] 05/11/2018, 12:23 PM

## 2018-05-11 NOTE — Progress Notes (Signed)
Occupational Therapy Session Note  Patient Details  Name: [name] MRN: [id number] Date of Birth: [birth year]  Today's Date: 05/11/2018 OT Individual Time: 1694-5038 OT Individual Time Calculation (min): 60 min    Short Term Goals: Week 1:  OT Short Term Goal 1 (Week 1): STG=LTGs d/t ELOS OT Short Term Goal 1 - Progress (Week 1): Progressing toward goal Week 2:  OT Short Term Goal 1 (Week 2): STG = LTG due to remaining LOS  Skilled Therapeutic Interventions/Progress Updates:    Pt seen this session to prepare for discharge tomorrow with review of ADL skills and home exercises.  Pt received in bed. Pt stated she bathed and dressed this am with nurse tech in the room.  She stated that she was able to accomplish all tasks independently using AE to don pants over R foot.  Reviewed her transfer strategies from couch where she will be sleeping to [facility].  Pt feels confident that she will be able to do that well and plans to practice several times with her spouse transferring from the power w/c to her couch at home before she tries it herself.   She then worked on various exercises that she can do from her couch.   AROM of B knee extension, L ankle AROM Lateral leans Level 4 theraband for UE;  Scapular adduction with arm horizontal abduction, bow and arrow single arm rows, alternating lat pull downs, and tricep extensions. Pt provided with written handout.  Pt resting in bed with all needs met.  Therapy Documentation Precautions:  Precautions Precautions: Fall Precaution Comments: s/p ankle ORIF Restrictions Weight Bearing Restrictions: Yes RLE Weight Bearing: Non weight bearing  Pain: Pain Assessment Pain Scale: 0-10 Pain Score: 0-No pain ADL:     See Function Navigator for Current Functional Status.   Therapy/Group: Individual Therapy  [name] 05/11/2018, 11:47 AM

## 2018-05-12 DIAGNOSIS — Z8781 Personal history of (healed) traumatic fracture: Secondary | ICD-10-CM

## 2018-05-12 MED ORDER — ENOXAPARIN SODIUM 40 MG/0.4ML ~~LOC~~ SOLN: 40.0000 mg | SUBCUTANEOUS | 0 refills | Status: DC

## 2018-05-12 MED ORDER — HYDROCODONE-ACETAMINOPHEN 7.5-325 MG PO TABS: 1.0000 | ORAL_TABLET | Freq: Four times a day (QID) | ORAL | 0 refills | Status: DC | PRN

## 2018-05-12 MED ORDER — DULOXETINE HCL 30 MG PO CPEP: 30.0000 mg | ORAL_CAPSULE | Freq: Every day | ORAL | 0 refills | Status: DC

## 2018-05-12 MED ORDER — DOCUSATE SODIUM 100 MG PO CAPS: 100.0000 mg | ORAL_CAPSULE | Freq: Two times a day (BID) | ORAL | 0 refills | Status: DC

## 2018-05-12 MED ORDER — METHOCARBAMOL 500 MG PO TABS: 500.0000 mg | ORAL_TABLET | Freq: Four times a day (QID) | ORAL | 0 refills | Status: DC | PRN

## 2018-05-12 NOTE — Discharge Summary (Signed)
discharge summary job # [id number]

## 2018-05-12 NOTE — Progress Notes (Signed)
PHYSICAL MEDICINE & REHABILITATION     PROGRESS NOTE    Subjective/Complaints: Pt without complaints. Denies pain. Excited about going home  ROS: Patient denies fever, rash, sore throat, blurred vision, nausea, vomiting, diarrhea, cough, shortness of breath or chest pain, joint or back pain, headache, or mood change.   Objective:  Dg Ankle Complete Right  Result Date: 05/10/2018 CLINICAL DATA:  Ankle fracture EXAM: RIGHT ANKLE - COMPLETE 3+ VIEW COMPARISON:  Plain film of the RIGHT ankle dated 05/03/2018. FINDINGS: Overlying casting remains in place. Osseous alignment is stable. Fixation hardware is stable in position within the distal tibia and fibula. IMPRESSION: No interval change appreciated. Osseous alignment appears stable. Fixation hardware within the distal RIGHT tibia and fibula appear stable in position. Electronically Signed   By: Franki Cabot M.D.   On: 05/10/2018 16:52   No results for input(s): WBC, HGB, HCT, PLT in the last 72 hours. No results for input(s): NA, K, CL, GLUCOSE, BUN, CREATININE, CALCIUM in the last 72 hours.  Invalid input(s): CO CBG (last 3)  No results for input(s): GLUCAP in the last 72 hours.  Wt Readings from Last 3 Encounters:  04/29/18 121.6 kg (268 lb 1.3 oz)  04/23/18 123.1 kg (271 lb 5 oz)  04/12/18 122.5 kg (270 lb)     Intake/Output Summary (Last 24 hours) at 05/12/2018 0908 Last data filed at 05/12/2018 0550 Gross per 24 hour  Intake 480 ml  Output 400 ml  Net 80 ml    Vital Signs: Blood pressure 116/64, pulse 85, temperature (!) 96.7 F (35.9 C), temperature source Oral, resp. rate 17, height 5' (1.524 m), weight 121.6 kg (268 lb 1.3 oz), SpO2 98 %. Physical Exam:  Constitutional: No distress . Vital signs reviewed. HEENT: EOMI, oral membranes moist Neck: supple Cardiovascular: RRR without murmur. No JVD    Respiratory: CTA Bilaterally without wheezes or rales. Normal effort    GI: BS +, non-tender, non-distended   Musc: Lymphedema b/l LE. Right LLE splint in place, leg remains sl externally rotated Neurological: She isalertand oriented Follows commands Motor: 5/5 B/l UE LLE 4/5 proximal to distal RLE 4/5 HF, KE, wiggles toes right foot --motor exam stable Skin: pink and warm Psych: very pleasant and cooperative  Assessment/Plan: 1. Impaired functional mobility  secondary to right trimalleolar ankle fracture which require 3+ hours per day of interdisciplinary therapy in a comprehensive inpatient rehab setting. Physiatrist is providing close team supervision and 24 hour management of active medical problems listed below. Physiatrist and rehab team continue to assess barriers to discharge/monitor patient progress toward functional and medical goals.  Function:  Bathing Bathing position   Position: Sitting EOB  Bathing parts Body parts bathed by patient: Right arm, Left arm, Chest, Abdomen, Front perineal area, Buttocks, Right upper leg, Left upper leg, Left lower leg Body parts bathed by helper: Buttocks, Back  Bathing assist Assist Level: More than reasonable time   Set up : To obtain items  Upper Body Dressing/Undressing Upper body dressing   What is the patient wearing?: Pull over shirt/dress, Bra Bra - Perfomed by patient: Thread/unthread left bra strap, Thread/unthread right bra strap, Hook/unhook bra (pull down sports bra)   Pull over shirt/dress - Perfomed by patient: Thread/unthread right sleeve, Thread/unthread left sleeve, Put head through opening, Pull shirt over trunk          Upper body assist Assist Level: More than reasonable time   Set up : To obtain clothing/put away  Lower Body  Dressing/Undressing Lower body dressing   What is the patient wearing?: Pants, Non-skid slipper socks     Pants- Performed by patient: Thread/unthread right pants leg, Thread/unthread left pants leg, Pull pants up/down Pants- Performed by helper: Thread/unthread right pants leg,  Thread/unthread left pants leg   Non-skid slipper socks- Performed by helper: Don/doff left sock Socks - Performed by patient: Don/doff right sock(sock aide)     Shoes - Performed by helper: Don/doff left shoe, Fasten right          Lower body assist Assist for lower body dressing: More than reasonable time Assistive Device Comment: Engineer, maintenance (IT)   Toileting steps completed by patient: Adjust clothing prior to toileting, Performs perineal hygiene, Adjust clothing after toileting Toileting steps completed by helper: Adjust clothing after toileting Toileting Assistive Devices: Toilet aid  Toileting assist Assist level: More than reasonable time   Transfers Chair/bed transfer   Chair/bed transfer method: Squat pivot Chair/bed transfer assist level: No Help, no cues, assistive device, takes more than a reasonable amount of time Chair/bed transfer assistive device: Armrests     Locomotion Ambulation Ambulation activity did not occur: Safety/medical concerns         Wheelchair Wheelchair activity did not occur: Safety/medical concerns Type: Motorized Max wheelchair distance: 150' Assist Level: No help, No cues, assistive device, takes more than reasonable amount of time  Cognition Comprehension Comprehension assist level: Follows complex conversation/direction with no assist  Expression Expression assist level: Expresses complex ideas: With no assist  Social Interaction Social Interaction assist level: Interacts appropriately with others with medication or extra time (anti-anxiety, antidepressant).  Problem Solving Problem solving assist level: Solves complex problems: Recognizes & self-corrects  Memory Memory assist level: Complete Independence: No helper   Medical Problem List and Plan: 1.Decreased functional mobilitysecondary to right trimalleolar ankle fracture status post ORIF 04/23/2018. Hx of spina bifida. Nonweightbearing.  -dc home today, goals  met    -orthopedics has indicated that she might need further surgery on her ankle given misalignment but that it was a risk due to her osteopenia. Follow up xray   again shows alignment of fracture/operative area. Pain is under control. Splint in place. She will see ortho on Friday to determine future plan. Folllow up with ortho as outpt  -I will not schedule follow up with me at office 2. DVT Prophylaxis/Anticoagulation: Subcutaneous Lovenox.     Vascular study negative and limited 3. Pain Management:Cymbalta 30 mg nightly, hydrocodone Robaxin and Advil as needed. 4. Mood:Provide emotional support 5. Neuropsych: This patientiscapable of making decisions on herown behalf. 6. Skin/Wound Care:continue routine skin care/prevention 7. 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 Patient did use electric scooter for long distances. 10.Constipation. Laxative assistance 11.Obesity. 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 Follow-up dietary 12. ABLA:   hgb 11.8 on 5/31----> up to 12.2     -continue to encourage adequate nutrition       LOS (Days) Parchment EVALUATION WAS PERFORMED  Meredith Staggers, MD 05/12/2018 9:08 AM

## 2018-05-12 NOTE — Discharge Instructions (Signed)
Inpatient Rehab Discharge Instructions  KAHLIA LAGUNES Discharge date and time: 05/12/18   Activities/Precautions/ Functional Status: Activity: Nonweightbearing right lower extremity Diet: regular diet Wound Care: keep splint clean and dry   Functional status:  ___ No restrictions     ___ Walk up steps independently _X__ 24/7 supervision/assistance   ___ Walk up steps with assistance ___ Intermittent supervision/assistance  ___ Bathe/dress independently ___ Walk with walker     _x__ Bathe/dress with assistance ___ Walk Independently    ___ Shower independently ___ Walk with assistance    ___ Shower with assistance _X__ No alcohol     ___ Return to work/school ________   COMMUNITY REFERRALS UPON DISCHARGE:   Home Health:   PT     OT  Agency:  [facility] Phone:  [phone] Medical Equipment/Items Ordered:  Drop arm commode; tub transfer bench; hip kit (from gift shop - save receipt for Workers' Compensation reimbursement); bariatric rolling walker; 30" slide board  Agency/Supplier:  [facility]       Phone:  [phone]  Special Instructions:    My questions have been answered and I understand these instructions. I will adhere to these goals and the provided educational materials after my discharge from the hospital.  Patient/Caregiver Signature _______________________________ Date __________  Clinician Signature _______________________________________ Date __________  Please bring this form and your medication list with you to all your follow-up doctor's appointments.

## 2018-05-12 NOTE — Progress Notes (Signed)
Resting throughout shift ,no acute distress or discomfort, anticipates discharge home today.

## 2018-05-13 NOTE — Discharge Summary (Signed)
[name]: Stein, Renee L. MEDICAL RECORD [id number] ACCOUNT [id number] DATE OF BIRTH:[birth year] FACILITY: [facility] LOCATION: [facility] PHYSICIAN:ZACHARY SWARTZ, MD  DISCHARGE SUMMARY  DATE OF DISCHARGE:  05/12/2018  DISCHARGE DIAGNOSES: 1.  Right trimalleolar ankle fracture with open reduction, internal fixation. 2.  Subcutaneous Lovenox for deep venous thrombosis prophylaxis.   3.  Pain management.   4.  Chiari malformation. 5.  Spina bifida. 6.  Constipation. 7.  Obesity. 8.  Acute blood loss anemia.  HISTORY OF PRESENT ILLNESS:  This is a 47 year old right-handed female with history of Chiari malformation, hydrocephalus with shunt in 2006, spina bifida with back surgery.  Independent prior to admission, would require electric scooter for long  distances.  Presented 04/23/2018 after recent fall while at work with right ankle pain.  X-rays revealed right ankle trimalleolar fracture.  Underwent ORIF 04/23/2018 per Dr. Aundria Rudogers.  HOSPITAL COURSE:  Pain management.  Nonweightbearing.  Subcutaneous Lovenox for DVT prophylaxis.  The patient was admitted for a comprehensive rehabilitation program.  PAST MEDICAL HISTORY:  See discharge diagnoses.  SOCIAL HISTORY:  Lives with spouse was working prior to admission  FUNCTIONAL STATUS:  Upon admission to rehabilitation services with moderate assist stand pivot transfers, moderate assist sit to supine; mod/max assist with activities of daily living.  PHYSICAL EXAMINATION: VITAL SIGNS:  Blood pressure 118/69, pulse 77, temperature 98, respirations 18. GENERAL:  Alert female in no acute distress.  EOMs intact. NECK:  Supple, nontender.  No JVD. CARDIOVASCULAR:  Rate controlled. ABDOMEN:  Soft, nontender, good bowel sounds. LUNGS:  Clear to auscultation without wheeze.  Splint in place to right lower extremity.  REHABILITATION HOSPITAL COURSE:  The patient was admitted to inpatient rehabilitation services.  Therapies initiated on a 3-hour daily  basis, consisting of physical therapy, occupational therapy and rehabilitation nursing.  The following issues were  addressed during patient's rehabilitation stay:    Pertaining to patient's right ankle fracture, she underwent ORIF 04/23/2018.  Sensation intact, nonweightbearing.  She would follow up with Dr. Aundria Rudogers.  Subcutaneous Lovenox for DVT prophylaxis.  Venous Doppler study is negative and limited.  Pain  management with the use of Cymbalta, hydrocodone and Robaxin.    Acute blood loss anemia 11.8-12.2.    She had a history of spina bifida.  She used an Art gallery managerelectric scooter for longer distances.  Constipation, resolved with laxative assistance.  Obesity with BMI of 52.99 to follow up per dietary services.  The patient received weekly collaborative  interdisciplinary team conferences to discuss estimated length of stay, family teaching any barriers to her discharge.  She was able to transfer throughout sessions with modified independence overall.  Supervision for car transfers with sliding board.   Standing tolerance x3 using a rolling walker to focus on alternating upper extremity support, maintaining nonweightbearing status, needing some assistance for lower body activities of daily living, modified independent upper body.  She was able to make  access to the bathroom at home.  Her husband was preparing meals.  A full family teaching was completed and plan was discharged to home.  DISCHARGE MEDICATIONS:  Included Colace 100 mg twice daily, Cymbalta 30 mg at bedtime, Lovenox 40 mg daily, hydrocodone 7.5/325 1 tablet every 6 hours as needed for pain, Robaxin 500 mg every 6 hours as needed for muscle spasms.    DIET:  Her diet was regular.    DISCHARGE INSTRUCTIONS:  She will continue nonweightbearing.    FOLLOWUP:  Follow up with Dr. Aundria Rudogers, orthopedic service.  Call for appointment.  AN/NUANCE  D:05/12/2018 T:05/13/2018 JOB:[id number]

## 2018-05-14 NOTE — Progress Notes (Signed)
Social Work Discharge Note  The overall goal for the admission was met for:   Discharge location: Yes  Length of Stay: Yes - 13 days  Discharge activity level: Yes - modified independent w/c level; supervision for car txs and some other tasks  Home/community participation: Yes  Services provided included: MD, RD, PT, OT, RN, Pharmacy and SW  Financial Services: Worker's Comp  Follow-up services arranged: Home Health: PT/OT, DME: bariatric rolling walker; drop arm commode; tub bench; 30" slide board and Patient/Family request agency HH: [facility], DME: [facility]  Pt's husband bought hip kit in the hospital gift shop and saved receipt to turn in to Workers' Comp to be reimbursed.  Comments (or additional information): CSW worked with [employer] who approved HH and DME.  CSW ordered DME and HH through [facility] after their approval and also sent orders to workers' comp case manager.  Pt did well with CIR program and was appreciative of care.  Has good family support.  Patient/Family verbalized understanding of follow-up arrangements: Yes  Individual responsible for coordination of the follow-up plan:  Pt with support from family.  Confirmed correct DME delivered: Prevatt, Jennifer Capps 05/14/2018    Prevatt, Jennifer Capps 

## 2018-05-14 NOTE — Patient Care Conference (Signed)
Inpatient RehabilitationTeam Conference and Plan of Care Update Date: 05/11/2018   Time: 2:00 PM    Patient Name: [name]      Medical Record Number: [id number]  Date of Birth: [birth year] Sex: Female         Room/Bed: 4W12C/4W12C-01 Payor Info: Payor: GENERIC WORKER'S COMP / Plan: Ernst Bowler / Product Type: *No Product type* /    Admitting Diagnosis: Rt ankle FX  Admit Date/Time:  04/29/2018  2:09 PM Admission Comments: No comment available   Primary Diagnosis:  <principal problem not specified> Principal Problem: <principal problem not specified>  Patient Active Problem List   Diagnosis Date Noted  . Hypoalbuminemia due to protein-calorie malnutrition ([place])   . Acute blood loss anemia   . 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 History of Chiari malformation   . Trimalleolar fracture of ankle, closed, left, initial encounter 04/29/2018  . Closed displaced trimalleolar fracture of right ankle 04/23/2018    Expected Discharge Date: Expected Discharge Date: 05/12/18  Team Members Present: Physician leading conference: Dr. Alger Simons Social Worker Present: Alfonse Alpers, LCSW Nurse Present: Leonette Nutting, RN PT Present: Dwyane Dee, PT OT Present: Roanna Epley, [place], OT SLP Present: Weston Anna, SLP PPS Coordinator present : Daiva Nakayama, RN, CRRN     Current Status/Progress Goal Weekly Team Focus  Medical   pain improved. concerns now about lack of healing in ankle  discern med-surgical plan moving forward  see above   Bowel/Bladder   LBM 05/10/18, Continent of bladder/bowel has been refusing Colace due to daily BM episodes, has prn meds  remain continent  QS assessment and prn    Swallow/Nutrition/ Hydration             ADL's   bathing/dressing EOB with lateral leans for LB-mod A; functional transfers-supervision/mod I; toileting-mod I with lateral leans  mod I overall  activity tolerance, functional transfers,  education, discharge planning   Mobility   mod I for transfers, occasional supervision when impulsive  mod I transfers, power w/c mobility mod I, standard w/c mobility supervision   d/c planning, d/c on Wednesday   Communication             Safety/Cognition/ Behavioral Observations            Pain   Continue as ordered for pain/discomfort Norco/Ibuphophen and Tylenol   < 2  QS and prn assessment of pain and discomfort of pain and follow-up results with documentation   Skin   Surgeon in 05/10/18 and reapplied a new cast with wound care, reported incision site unremarkable and healing well  Maintain surgical site, maintaining area free of infection  QS assessment , notify MD of  indication of signs/infections or complications, Educate patient/family     Rehab Goals Patient on target to meet rehab goals: Yes Rehab Goals Revised: none *See Care Plan and progress notes for long and short-term goals.     Barriers to Discharge  Current Status/Progress Possible Resolutions Date Resolved   Physician    Medical stability               Nursing                  PT                    OT                  SLP  SW                Discharge Planning/Teaching Needs:  Pt to return to her home where she lives with her husband and 43 y/o son.  Pt feels ready to d/c home.  Pt is independent to direct her care.   Team Discussion:  MD has determined pt is medically stable and therapy team stated she met goals.  Pt is ready for discharge.  Revisions to Treatment Plan:  none    Continued Need for Acute Rehabilitation Level of Care: The patient requires daily medical management by a physician with specialized training in physical medicine and rehabilitation for the following conditions: Daily direction of a multidisciplinary physical rehabilitation program to ensure safe treatment while eliciting the highest outcome that is of practical value to the patient.: Yes Daily medical  management of patient stability for increased activity during participation in an intensive rehabilitation regime.: Yes Daily analysis of laboratory values and/or radiology reports with any subsequent need for medication adjustment of medical intervention for : Post surgical problems  Masaichi Kracht, Silvestre Mesi 05/14/2018, 1:44 PM

## 2018-05-14 NOTE — Progress Notes (Signed)
Social Work Patient ID: [name], female   DOB: [birth year], 48 y.o.   MRN: [id number]   CSW met with pt to update her on team conference discussion and to let her know that team feels d/c on 05-12-18 is still appropriate.  CSW worked on [facility] and DME for pt over the last week with workers' Therapist, occupational and everything was finally in place on day of d/c.  Pt/family appreciative of care on CIR and to be going home.  CSW available as needed.

## 2018-05-17 ENCOUNTER — Other Ambulatory Visit: Payer: Self-pay

## 2018-05-17 ENCOUNTER — Encounter (HOSPITAL_COMMUNITY): Payer: Self-pay | Admitting: *Deleted

## 2018-05-17 MED ORDER — DEXTROSE 5 % IV SOLN
3.0000 g | INTRAVENOUS | Status: AC
  Administered 2018-05-18: 3 g via INTRAVENOUS
  Filled 2018-05-17: qty 3

## 2018-05-17 NOTE — Progress Notes (Signed)
Pt has a history of Malignant Hyperthermia. Pt denies SOB, chest pain, and being under the care of a cardiologist. Pt denies having a stress test and cardiac cath but stated that an echo was performed > 20 years ago. Pt denies having an EKG and chest x ray within the last year. Pt made aware to stop taking Aspirin ( unless instructed otherwise by surgeon) , vitamins, fish oil and herbal medications. Do not take any NSAIDs ie: Ibuprofen, Advil, Naproxen (Aleve), Motrin, BC and Goody Powder. Pt stated that she was not given any instructions regarding Lovenox; lvm with Sherrie,Surgical Coordinator, to contact pt regarding pre-op Lovenox instructions. Pt advised to call surgeons office for pre-op Lovenox instructions as well. Pt verbalized understanding of all pre-op instructions.

## 2018-05-18 ENCOUNTER — Encounter (HOSPITAL_COMMUNITY): Payer: Self-pay | Admitting: *Deleted

## 2018-05-18 ENCOUNTER — Ambulatory Visit (HOSPITAL_COMMUNITY): Payer: No Typology Code available for payment source

## 2018-05-18 ENCOUNTER — Ambulatory Visit (HOSPITAL_COMMUNITY): Payer: No Typology Code available for payment source | Admitting: Emergency Medicine

## 2018-05-18 ENCOUNTER — Other Ambulatory Visit: Payer: Self-pay

## 2018-05-18 ENCOUNTER — Encounter (HOSPITAL_COMMUNITY)
Admission: RE | Disposition: A | Discharge status: Home / Self Care | Payer: Self-pay | Source: Ambulatory Visit | Attending: Orthopedic Surgery

## 2018-05-18 ENCOUNTER — Observation Stay (HOSPITAL_COMMUNITY)
Admission: RE | Admit: 2018-05-18 | Discharge: 2018-05-20 | Disposition: A | Discharge status: Home / Self Care | Payer: No Typology Code available for payment source | Source: Ambulatory Visit | Attending: Orthopedic Surgery | Admitting: Orthopedic Surgery

## 2018-05-18 DIAGNOSIS — Z6841 Body Mass Index (BMI) 40.0 and over, adult: Secondary | ICD-10-CM | POA: Diagnosis not present

## 2018-05-18 DIAGNOSIS — Z91040 Latex allergy status: Secondary | ICD-10-CM | POA: Insufficient documentation

## 2018-05-18 DIAGNOSIS — Z472 Encounter for removal of internal fixation device: Secondary | ICD-10-CM | POA: Insufficient documentation

## 2018-05-18 DIAGNOSIS — Z885 Allergy status to narcotic agent status: Secondary | ICD-10-CM | POA: Insufficient documentation

## 2018-05-18 DIAGNOSIS — M1990 Unspecified osteoarthritis, unspecified site: Secondary | ICD-10-CM | POA: Diagnosis not present

## 2018-05-18 DIAGNOSIS — Q0701 Arnold-Chiari syndrome with spina bifida: Secondary | ICD-10-CM | POA: Diagnosis not present

## 2018-05-18 DIAGNOSIS — Z888 Allergy status to other drugs, medicaments and biological substances status: Secondary | ICD-10-CM | POA: Diagnosis not present

## 2018-05-18 DIAGNOSIS — X58XXXA Exposure to other specified factors, initial encounter: Secondary | ICD-10-CM | POA: Insufficient documentation

## 2018-05-18 DIAGNOSIS — S82851A Displaced trimalleolar fracture of right lower leg, initial encounter for closed fracture: Secondary | ICD-10-CM | POA: Diagnosis not present

## 2018-05-18 DIAGNOSIS — Z419 Encounter for procedure for purposes other than remedying health state, unspecified: Secondary | ICD-10-CM

## 2018-05-18 DIAGNOSIS — Z79899 Other long term (current) drug therapy: Secondary | ICD-10-CM | POA: Diagnosis not present

## 2018-05-18 DIAGNOSIS — Z884 Allergy status to anesthetic agent status: Secondary | ICD-10-CM | POA: Diagnosis not present

## 2018-05-18 DIAGNOSIS — Z7901 Long term (current) use of anticoagulants: Secondary | ICD-10-CM | POA: Insufficient documentation

## 2018-05-18 DIAGNOSIS — K219 Gastro-esophageal reflux disease without esophagitis: Secondary | ICD-10-CM | POA: Diagnosis not present

## 2018-05-18 HISTORY — DX: Other specified postprocedural states: Z98.890

## 2018-05-18 HISTORY — DX: Other specified postprocedural states: R11.2

## 2018-05-18 HISTORY — PX: EXTERNAL FIXATION LEG: SHX1549

## 2018-05-18 HISTORY — PX: HARDWARE REMOVAL: SHX979

## 2018-05-18 HISTORY — PX: ORIF ANKLE FRACTURE: SHX5408

## 2018-05-18 HISTORY — DX: Family history of other specified conditions: Z84.89

## 2018-05-18 LAB — HCG, SERUM, QUALITATIVE: Preg, Serum: NEGATIVE

## 2018-05-18 SURGERY — OPEN REDUCTION INTERNAL FIXATION (ORIF) ANKLE FRACTURE
Anesthesia: Regional | Site: Leg Lower | Laterality: Right

## 2018-05-18 MED ORDER — LACTATED RINGERS IV SOLN
INTRAVENOUS | Status: DC | PRN
  Administered 2018-05-18: 13:00:00 via INTRAVENOUS

## 2018-05-18 MED ORDER — LIDOCAINE HCL (CARDIAC) PF 100 MG/5ML IV SOSY
PREFILLED_SYRINGE | INTRAVENOUS | Status: DC | PRN
  Administered 2018-05-18: 60 mg via INTRAVENOUS

## 2018-05-18 MED ORDER — FENTANYL CITRATE (PF) 100 MCG/2ML IJ SOLN
50.0000 ug | Freq: Once | INTRAMUSCULAR | Status: AC
  Administered 2018-05-18: 50 ug via INTRAVENOUS

## 2018-05-18 MED ORDER — MIDAZOLAM HCL 2 MG/2ML IJ SOLN
INTRAMUSCULAR | Status: AC
  Filled 2018-05-18: qty 2

## 2018-05-18 MED ORDER — ONDANSETRON HCL 4 MG/2ML IJ SOLN
INTRAMUSCULAR | Status: DC | PRN
  Administered 2018-05-18: 4 mg via INTRAVENOUS

## 2018-05-18 MED ORDER — MORPHINE SULFATE (PF) 2 MG/ML IV SOLN
2.0000 mg | INTRAVENOUS | Status: DC | PRN
  Administered 2018-05-18 – 2018-05-19 (×2): 2 mg via INTRAVENOUS
  Filled 2018-05-18 (×2): qty 1

## 2018-05-18 MED ORDER — CEFAZOLIN SODIUM-DEXTROSE 1-4 GM/50ML-% IV SOLN
1.0000 g | Freq: Three times a day (TID) | INTRAVENOUS | Status: AC
  Administered 2018-05-18 – 2018-05-19 (×2): 1 g via INTRAVENOUS
  Filled 2018-05-18 (×2): qty 50

## 2018-05-18 MED ORDER — HYDROCODONE-ACETAMINOPHEN 7.5-325 MG PO TABS
1.0000 | ORAL_TABLET | ORAL | Status: DC | PRN
  Administered 2018-05-19 (×3): 2 via ORAL
  Administered 2018-05-19 – 2018-05-20 (×4): 1 via ORAL
  Filled 2018-05-18 (×2): qty 1
  Filled 2018-05-18: qty 2
  Filled 2018-05-18: qty 1
  Filled 2018-05-18 (×2): qty 2
  Filled 2018-05-18: qty 1
  Filled 2018-05-18: qty 2

## 2018-05-18 MED ORDER — LACTATED RINGERS IV SOLN
INTRAVENOUS | Status: DC
  Administered 2018-05-18 (×2): via INTRAVENOUS

## 2018-05-18 MED ORDER — FENTANYL CITRATE (PF) 100 MCG/2ML IJ SOLN
INTRAMUSCULAR | Status: AC
  Administered 2018-05-18: 50 ug via INTRAVENOUS
  Filled 2018-05-18: qty 2

## 2018-05-18 MED ORDER — FENTANYL CITRATE (PF) 250 MCG/5ML IJ SOLN
INTRAMUSCULAR | Status: AC
  Filled 2018-05-18: qty 5

## 2018-05-18 MED ORDER — DOCUSATE SODIUM 100 MG PO CAPS
100.0000 mg | ORAL_CAPSULE | Freq: Two times a day (BID) | ORAL | Status: DC
  Administered 2018-05-18 – 2018-05-20 (×4): 100 mg via ORAL
  Filled 2018-05-18 (×4): qty 1

## 2018-05-18 MED ORDER — PROPOFOL 500 MG/50ML IV EMUL
INTRAVENOUS | Status: DC | PRN
  Administered 2018-05-18: 75 ug/kg/min via INTRAVENOUS

## 2018-05-18 MED ORDER — ACETAMINOPHEN 325 MG PO TABS: 650.0000 mg | ORAL_TABLET | Freq: Four times a day (QID) | ORAL | Status: DC | PRN

## 2018-05-18 MED ORDER — PROPOFOL 10 MG/ML IV BOLUS
INTRAVENOUS | Status: AC
  Filled 2018-05-18: qty 20

## 2018-05-18 MED ORDER — PROTAMINE SULFATE 10 MG/ML IV SOLN
INTRAVENOUS | Status: AC
  Filled 2018-05-18: qty 5

## 2018-05-18 MED ORDER — MIDAZOLAM HCL 5 MG/5ML IJ SOLN
INTRAMUSCULAR | Status: DC | PRN
  Administered 2018-05-18: 2 mg via INTRAVENOUS

## 2018-05-18 MED ORDER — FENTANYL CITRATE (PF) 100 MCG/2ML IJ SOLN
INTRAMUSCULAR | Status: DC | PRN
  Administered 2018-05-18 (×4): 50 ug via INTRAVENOUS

## 2018-05-18 MED ORDER — METHOCARBAMOL 500 MG PO TABS
500.0000 mg | ORAL_TABLET | Freq: Four times a day (QID) | ORAL | Status: DC | PRN
  Administered 2018-05-18 – 2018-05-20 (×7): 500 mg via ORAL
  Filled 2018-05-18 (×9): qty 1

## 2018-05-18 MED ORDER — MIDAZOLAM HCL 2 MG/2ML IJ SOLN
1.0000 mg | Freq: Once | INTRAMUSCULAR | Status: AC
  Administered 2018-05-18: 1 mg via INTRAVENOUS

## 2018-05-18 MED ORDER — METHOCARBAMOL 500 MG PO TABS
ORAL_TABLET | ORAL | Status: AC
  Filled 2018-05-18: qty 1

## 2018-05-18 MED ORDER — ONDANSETRON HCL 4 MG PO TABS: 4.0000 mg | ORAL_TABLET | Freq: Four times a day (QID) | ORAL | Status: DC | PRN

## 2018-05-18 MED ORDER — MIDAZOLAM HCL 2 MG/2ML IJ SOLN
INTRAMUSCULAR | Status: AC
  Administered 2018-05-18: 1 mg via INTRAVENOUS
  Filled 2018-05-18: qty 2

## 2018-05-18 MED ORDER — HYDROMORPHONE HCL 2 MG/ML IJ SOLN
INTRAMUSCULAR | Status: AC
  Filled 2018-05-18: qty 1

## 2018-05-18 MED ORDER — HYDROMORPHONE HCL 2 MG/ML IJ SOLN
0.2500 mg | INTRAMUSCULAR | Status: DC | PRN
  Administered 2018-05-18: 0.3 mg via INTRAVENOUS

## 2018-05-18 MED ORDER — ENOXAPARIN SODIUM 40 MG/0.4ML ~~LOC~~ SOLN
40.0000 mg | SUBCUTANEOUS | Status: DC
  Administered 2018-05-19 – 2018-05-20 (×2): 40 mg via SUBCUTANEOUS
  Filled 2018-05-18 (×2): qty 0.4

## 2018-05-18 MED ORDER — ONDANSETRON HCL 4 MG/2ML IJ SOLN: 4.0000 mg | Freq: Four times a day (QID) | INTRAMUSCULAR | Status: DC | PRN

## 2018-05-18 MED ORDER — DULOXETINE HCL 30 MG PO CPEP
30.0000 mg | ORAL_CAPSULE | Freq: Every day | ORAL | Status: DC
  Administered 2018-05-18 – 2018-05-19 (×2): 30 mg via ORAL
  Filled 2018-05-18 (×2): qty 1

## 2018-05-18 MED ORDER — PHENYLEPHRINE HCL 10 MG/ML IJ SOLN
INTRAVENOUS | Status: DC | PRN
  Administered 2018-05-18: 50 ug/min via INTRAVENOUS

## 2018-05-18 MED ORDER — ACETAMINOPHEN 650 MG RE SUPP: 650.0000 mg | Freq: Four times a day (QID) | RECTAL | Status: DC | PRN

## 2018-05-18 MED ORDER — 0.9 % SODIUM CHLORIDE (POUR BTL) OPTIME
TOPICAL | Status: DC | PRN
  Administered 2018-05-18: 1000 mL

## 2018-05-18 MED ORDER — CHLORHEXIDINE GLUCONATE 4 % EX LIQD: 60.0000 mL | Freq: Once | CUTANEOUS | Status: DC

## 2018-05-18 MED ORDER — ONDANSETRON HCL 4 MG/2ML IJ SOLN
4.0000 mg | Freq: Four times a day (QID) | INTRAMUSCULAR | Status: DC | PRN
Start: 2018-05-18 — End: 2018-05-20

## 2018-05-18 MED ORDER — PROPOFOL 10 MG/ML IV BOLUS
INTRAVENOUS | Status: DC | PRN
  Administered 2018-05-18: 200 mg via INTRAVENOUS
  Administered 2018-05-18: 10 mg via INTRAVENOUS

## 2018-05-18 MED ORDER — POLYETHYLENE GLYCOL 3350 17 G PO PACK: 17.0000 g | PACK | Freq: Every day | ORAL | Status: DC | PRN

## 2018-05-18 MED ORDER — ONDANSETRON HCL 4 MG/2ML IJ SOLN
INTRAMUSCULAR | Status: AC
  Filled 2018-05-18: qty 2

## 2018-05-18 MED ORDER — BUPIVACAINE-EPINEPHRINE (PF) 0.5% -1:200000 IJ SOLN
INTRAMUSCULAR | Status: DC | PRN
  Administered 2018-05-18: 30 mL via PERINEURAL

## 2018-05-18 SURGICAL SUPPLY — 62 items
ALCOHOL 70% 16 OZ (MISCELLANEOUS) ×5 IMPLANT
BANDAGE ACE 4X5 VEL STRL LF (GAUZE/BANDAGES/DRESSINGS) ×4 IMPLANT
BANDAGE ACE 6X5 VEL STRL LF (GAUZE/BANDAGES/DRESSINGS) IMPLANT
BANDAGE ESMARK 6X9 LF (GAUZE/BANDAGES/DRESSINGS) IMPLANT
BIT DRILL SLF SCHNZ SCR4.0X150 (EXFIX) IMPLANT
BNDG CMPR 9X6 STRL LF SNTH (GAUZE/BANDAGES/DRESSINGS) ×3
BNDG COHESIVE 4X5 TAN STRL (GAUZE/BANDAGES/DRESSINGS) ×5 IMPLANT
BNDG ESMARK 6X9 LF (GAUZE/BANDAGES/DRESSINGS) ×5
BNDG GAUZE ELAST 4 BULKY (GAUZE/BANDAGES/DRESSINGS) ×2 IMPLANT
CANISTER SUCT 3000ML PPV (MISCELLANEOUS) ×5 IMPLANT
CLAMP COMBINATION LRG (EXFIX) ×12 IMPLANT
COVER SURGICAL LIGHT HANDLE (MISCELLANEOUS) ×5 IMPLANT
CUFF TOURNIQUET SINGLE 34IN LL (TOURNIQUET CUFF) IMPLANT
CUFF TOURNIQUET SINGLE 44IN (TOURNIQUET CUFF) ×2 IMPLANT
DRAPE C-ARM 42X72 X-RAY (DRAPES) ×5 IMPLANT
DRAPE C-ARMOR (DRAPES) ×5 IMPLANT
DRAPE U-SHAPE 47X51 STRL (DRAPES) ×7 IMPLANT
DRILL SELF SCHANZ SCRW 4.0X150 (EXFIX) ×10
DRSG ADAPTIC 3X8 NADH LF (GAUZE/BANDAGES/DRESSINGS) ×5 IMPLANT
DRSG PAD ABDOMINAL 8X10 ST (GAUZE/BANDAGES/DRESSINGS) ×5 IMPLANT
DURAPREP 26ML APPLICATOR (WOUND CARE) ×5 IMPLANT
ELECT REM PT RETURN 9FT ADLT (ELECTROSURGICAL) ×5
ELECTRODE REM PT RTRN 9FT ADLT (ELECTROSURGICAL) ×3 IMPLANT
GAUZE SPONGE 4X4 12PLY STRL (GAUZE/BANDAGES/DRESSINGS) ×5 IMPLANT
GAUZE XEROFORM 5X9 LF (GAUZE/BANDAGES/DRESSINGS) ×2 IMPLANT
GLOVE BIO SURGEON STRL SZ7.5 (GLOVE) ×5 IMPLANT
GLOVE BIOGEL PI IND STRL 8 (GLOVE) ×3 IMPLANT
GLOVE BIOGEL PI INDICATOR 8 (GLOVE) ×2
GOWN STRL REUS W/ TWL LRG LVL3 (GOWN DISPOSABLE) ×6 IMPLANT
GOWN STRL REUS W/ TWL XL LVL3 (GOWN DISPOSABLE) ×3 IMPLANT
GOWN STRL REUS W/TWL LRG LVL3 (GOWN DISPOSABLE) ×15
GOWN STRL REUS W/TWL XL LVL3 (GOWN DISPOSABLE) ×5
GUIDEWIRE 1.35MM (WIRE) ×4 IMPLANT
KIT BASIN OR (CUSTOM PROCEDURE TRAY) ×5 IMPLANT
KIT TURNOVER KIT B (KITS) ×5 IMPLANT
NDL HYPO 25GX1X1/2 BEV (NEEDLE) IMPLANT
NEEDLE HYPO 25GX1X1/2 BEV (NEEDLE) ×5 IMPLANT
NS IRRIG 1000ML POUR BTL (IV SOLUTION) ×5 IMPLANT
PACK ORTHO EXTREMITY (CUSTOM PROCEDURE TRAY) ×5 IMPLANT
PAD ABD 8X10 STRL (GAUZE/BANDAGES/DRESSINGS) ×4 IMPLANT
PAD ARMBOARD 7.5X6 YLW CONV (MISCELLANEOUS) ×10 IMPLANT
PAD CAST 4YDX4 CTTN HI CHSV (CAST SUPPLIES) ×6 IMPLANT
PADDING CAST COTTON 4X4 STRL (CAST SUPPLIES)
PADDING CAST COTTON 6X4 STRL (CAST SUPPLIES) ×3 IMPLANT
PIN TRANSFIX 6.0 300 SS (EXFIX) ×2 IMPLANT
ROD CARBON 150 (EXFIX) ×2 IMPLANT
ROD CARBON 300 (EXFIX) ×2 IMPLANT
ROD CARBON 350 (EXFIX) ×2 IMPLANT
SPONGE LAP 18X18 X RAY DECT (DISPOSABLE) IMPLANT
STAPLER VISISTAT 35W (STAPLE) ×2 IMPLANT
SUCTION FRAZIER HANDLE 10FR (MISCELLANEOUS) ×2
SUCTION TUBE FRAZIER 10FR DISP (MISCELLANEOUS) ×3 IMPLANT
SUT ETHILON 3 0 PS 1 (SUTURE) ×5 IMPLANT
SUT VIC AB 0 CT1 27 (SUTURE)
SUT VIC AB 0 CT1 27XBRD ANBCTR (SUTURE) IMPLANT
SUT VIC AB 2-0 CT1 27 (SUTURE) ×10
SUT VIC AB 2-0 CT1 TAPERPNT 27 (SUTURE) ×3 IMPLANT
SYR CONTROL 10ML LL (SYRINGE) ×2 IMPLANT
TOWEL OR 17X26 10 PK STRL BLUE (TOWEL DISPOSABLE) ×10 IMPLANT
TUBE CONNECTING 12'X1/4 (SUCTIONS) ×1
TUBE CONNECTING 12X1/4 (SUCTIONS) ×4 IMPLANT
YANKAUER SUCT BULB TIP NO VENT (SUCTIONS) ×5 IMPLANT

## 2018-05-18 NOTE — Brief Op Note (Signed)
05/18/2018  3:46 PM  PATIENT:  Renee FreesBarbara L Stein  47 y.o. female  PRE-OPERATIVE DIAGNOSIS:  Right ankle trimalleolar fracture hardware failure  POST-OPERATIVE DIAGNOSIS:  Right Ankle hardware failure.  PROCEDURE:  Procedure(s) with comments: Right ankle open reduction internal fixation revision (Right) - 120 mins HARDWARE REMOVAL (Right) EXTERNAL FIXATION LEG (Right)  SURGEON:  Surgeon(s) and Role:    * Aundria Rudogers, Noah DelaineJason Patrick, MD - Primary  PHYSICIAN ASSISTANT:   ASSISTANTS: Patrick Jupiterarla Bethune, RNFA   ANESTHESIA:   regional and general  EBL:  10 mL   BLOOD ADMINISTERED:none  DRAINS: none   LOCAL MEDICATIONS USED:  NONE  SPECIMEN:  No Specimen  DISPOSITION OF SPECIMEN:  N/A  COUNTS:  YES  TOURNIQUET:   Total Tourniquet Time Documented: Thigh (Right) - 54 minutes Total: Thigh (Right) - 54 minutes   DICTATION: .Note written in EPIC  PLAN OF CARE: Admit for overnight observation  PATIENT DISPOSITION:  PACU - hemodynamically stable.   Delay start of Pharmacological VTE agent (>24hrs) due to surgical blood loss or risk of bleeding: not applicable

## 2018-05-18 NOTE — Op Note (Signed)
Date of Surgery: 05/18/2018  INDICATIONS: [name] is a 47 y.o.-year-old female who sustained a comminuted right trimalleolar fracture; she was treated with open reduction and internal fixation about 3 weeks ago.  Unfortunately, during the interval between the operating room into splint changes in her follow-up appointment the medial fixation was lost as well as the ankle mortise reduction.  She returns to the operating room today for revision of the ankle fixation with possible external fixator placement. The patient did consent to the procedure after discussion of the risks and benefits.   PREOPERATIVE DIAGNOSIS: right trimalleolar ankle fracture with loss of fixation and malreduction.   POSTOPERATIVE DIAGNOSIS: Same.  PROCEDURE:  1. Hardware removal of deep implant medial right ankle.   2. External fixation right triamlleolar ankle fracture CPT , [id number] multiplane;   SURGEON: Maryan RuedJason P Kentarius Partington, M.D.  Assistant: Patrick Jupiterarla Bethune, RNFA  ANESTHESIA: general  IV FLUIDS AND URINE: See anesthesia.  ESTIMATED BLOOD LOSS: 10 mL.  IMPLANTS: Arthrex large external fixator, delta frame  DRAINS: None.  COMPLICATIONS: None.  DESCRIPTION OF PROCEDURE: The patient was identified in the preoperative holding area.  The operative site was marked by the surgeon and confirmed by the patient.  SHe was brought back to the operating room.  Anesthesia was induced by the anesthesia team.  A well padded nonsterile tourniquet was placed. The operative extremity was prepped and draped in standard sterile fashion.  A timeout was performed.  Preoperative antibiotics were given.    We began by exposing the medial malleolus.  We used the previously utilized medial incision.  We opened along its course and dissected down to the medial bone.  Utilizing 2 Kirschner wires we cannulated the 2 medially placed screws which were completely pulled out from the metaphysis.  These were backed out without complication.  They were  passed off the back table.  Next we assessed the reduction with manual translation of the ankle.  We were able to improve the mortise and improve the medial malleolus alignment with manual translation.  During the course of this it was noted that there was some motion through the comminution that was bridged in the lateral malleolus due to her poor bone quality.  Furthermore, the medial bone quality was noted to be very poor and there was no opportunity for fixation of the distal fragment.  At this juncture we decided to place a delta frame external fixator as definitive treatment for this unstable ankle fracture.  The bony landmarks were palpated and the pin sites were marked on the skin.  Each Schanz pin was placed in the same fashion -- first drilling with the 3.5 mm drill while copiously irrigating, then hand placing the pin.  This was confirmed on x-ray on both views.  The ex-fix clamps were placed onto pins and the fracture was pulled into the proper alignment.  The clamps were completely tightened.     Final x-rays were taken in AP and lateral views to confirm the reduction and pin lengths. The wounds were cleaned and dried a final time and a sterile dressing consisting of Xeroform and kerlix was placed.  The patient was then transferred back to the bed and left the operating room in stable condition.  All sponge and instrument counts were correct.  There were no immediate complications.  POSTOPERATIVE PLAN: [name] will remain non weight bearing with the leg elevated.  she will return to the Office in 2-3 weeks for medial wound check and suture removal.  [name] will receive Lovenox for DVT prophylaxis based on other medications, activity level, and risk ratio of bleeding to thrombosis.  Pin site care will be initiated on postoperative day one.   Maryan Rued, MD [facility] [phone]

## 2018-05-18 NOTE — H&P (Signed)
ORTHOPAEDIC H and P  REQUESTING PHYSICIAN: Yolonda Kida, MD  PCP:  Patient, No Pcp Per  Chief Complaint: Right ankle fracture  HPI: [name] is a 47 y.o. female who complains of right ankle loss of reduction following a stay in inpatient rehab and multiple splint changes.  We discussed moving forward with revision of the ankle today.  She is here today for that surgery.  No new complaints.    Past Medical History:  Diagnosis Date  . Chiari malformation type II (HCC)   . Family history of adverse reaction to anesthesia    grandfather died from anesthesia; grandfather had malignant hyperthermia  . GERD (gastroesophageal reflux disease)   . Hydrocephalus   . Malignant hyperthermia   . Osteoarthritis   . PONV (postoperative nausea and vomiting)   . Spina bifida [facility])    Past Surgical History:  Procedure Laterality Date  . ANKLE ARTHROSCOPY Left    related to Tendon  . BACK SURGERY  1971   Spinal Bifida  . KNEE ARTHROSCOPY Left 1983  . ORIF ANKLE FRACTURE Right 04/23/2018   Procedure: OPEN REDUCTION INTERNAL FIXATION (ORIF) TRIMALLEOLAR ANKLE FRACTURE;  Surgeon: Yolonda Kida, MD;  Location: [facility] ORS;  Service: Orthopedics;  Laterality: Right;  120 mins  . SHUNT EXTERNALIZATION     Head x 2   Social History   Socioeconomic History  . Marital status: Married    Spouse name: Not on file  . Number of children: Not on file  . Years of education: Not on file  . Highest education level: Not on file  Occupational History  . Not on file  Social Needs  . Financial resource strain: Not on file  . Food insecurity:    Worry: Not on file    Inability: Not on file  . Transportation needs:    Medical: Not on file    Non-medical: Not on file  Tobacco Use  . Smoking status: Never Smoker  . Smokeless tobacco: Never Used  Substance and Sexual Activity  . Alcohol use: Never    Frequency: Never  . Drug use: Never  . Sexual activity: Not on file    Lifestyle  . Physical activity:    Days per week: Not on file    Minutes per session: Not on file  . Stress: Not on file  Relationships  . Social connections:    Talks on phone: Not on file    Gets together: Not on file    Attends religious service: Not on file    Active member of club or organization: Not on file    Attends meetings of clubs or organizations: Not on file    Relationship status: Not on file  Other Topics Concern  . Not on file  Social History Narrative  . Not on file   Family History  Problem Relation Age of Onset  . Non-Hodgkin's lymphoma Mother   . Hypertension Mother   . Hypertension Father    Allergies  Allergen Reactions  . Anesthetics, Amide Other (See Comments)    Pt has Malignant Hyperthermia which is an allergy to general anesthesia  . Gabapentin Other (See Comments)    Depression.  Marland Kitchen Percocet [Oxycodone-Acetaminophen] Itching  . Latex Rash  . Other Rash and Other (See Comments)    Latex Adhesive tape: prefers to use paper tape   Prior to Admission medications   Medication Sig Start Date End Date Taking? Authorizing Provider  acetaminophen (TYLENOL) 500 MG  tablet Take 1,000 mg by mouth every 6 (six) hours as needed for moderate pain or headache.   Yes [provider]  docusate sodium (COLACE) 100 MG capsule Take 1 capsule (100 mg total) by mouth 2 (two) times daily. Patient taking differently: Take 100 mg by mouth 2 (two) times daily as needed for moderate constipation.  05/12/18  Yes Love, Evlyn KannerPamela S, PA-C  DULoxetine (CYMBALTA) 30 MG capsule Take 1 capsule (30 mg total) by mouth at bedtime. 05/12/18  Yes Love, Evlyn KannerPamela S, PA-C  enoxaparin (LOVENOX) 40 MG/0.4ML injection Inject 0.4 mLs (40 mg total) into the skin daily. 05/12/18 06/23/18 Yes Love, Evlyn KannerPamela S, PA-C  HYDROcodone-acetaminophen (NORCO) 7.5-325 MG tablet Take 1 tablet by mouth every 6 (six) hours as needed (breakthrough pain). 05/12/18  Yes Love, Evlyn KannerPamela S, PA-C  ibuprofen  (ADVIL,MOTRIN) 200 MG tablet Take 400-600 mg by mouth every 8 (eight) hours as needed for moderate pain.    Yes [provider]  methocarbamol (ROBAXIN) 500 MG tablet Take 1 tablet (500 mg total) by mouth every 6 (six) hours as needed for muscle spasms. 05/12/18  Yes Love, Evlyn KannerPamela S, PA-C   No results found.  Positive ROS: All other systems have been reviewed and were otherwise negative with the exception of those mentioned in the HPI and as above.  Physical Exam: General: Alert, no acute distress Cardiovascular: No pedal edema Respiratory: No cyanosis, no use of accessory musculature GI: No organomegaly, abdomen is soft and non-tender Skin: No lesions in the area of chief complaint Neurologic: Sensation intact distally Psychiatric: Patient is competent for consent with normal mood and affect Lymphatic: No axillary or cervical lymphadenopathy  MUSCULOSKELETAL:  RLE: splinted, moderated swelling.  Splint windowed, no fracture blisters noted  Assessment: Right ankle fracture, trimalleolar with loss of reduction  Plan: - to the OR for revision ORIF of right ankle - The risks, benefits, and alternatives were discussed with the patient. There are risks associated with the surgery including, but not limited to, problems with anesthesia (death), infection, differences in leg length/angulation/rotation, fracture of bones, loosening or failure of implants, malunion, nonunion, hematoma (blood accumulation) which may require surgical drainage, blood clots, pulmonary embolism, nerve injury (foot drop), and blood vessel injury. The patient understands these risks and elects to proceed. - admit to obs post op for pain control and monitoring    Yolonda KidaJason Patrick Ivet Guerrieri, MD Cell [phone]    05/18/2018 1:56 PM

## 2018-05-18 NOTE — Transfer of Care (Signed)
Immediate Anesthesia Transfer of Care Note  Patient: [name]  Procedure(s) Performed: Right ankle open reduction internal fixation revision (Right ) HARDWARE REMOVAL (Right Ankle) EXTERNAL FIXATION LEG (Right Leg Lower)  Patient Location: PACU  Anesthesia Type:General and GA combined with regional for post-op pain  Level of Consciousness: awake, alert , oriented and patient cooperative  Airway & Oxygen Therapy: Patient Spontanous Breathing and Patient connected to nasal cannula oxygen  Post-op Assessment: Report given to RN, Post -op Vital signs reviewed and stable and Patient moving all extremities X 4  Post vital signs: Reviewed and stable  Last Vitals:  Vitals Value Taken Time  BP 127/71 05/18/2018  3:45 PM  Temp    Pulse 88 05/18/2018  3:47 PM  Resp 15 05/18/2018  3:47 PM  SpO2 96 % 05/18/2018  3:47 PM  Vitals shown include unvalidated device data.  Last Pain:  Vitals:   05/18/18 1213  TempSrc:   PainSc: 0-No pain      Patients Stated Pain Goal: 3 (05/18/18 1213)  Complications: No apparent anesthesia complications

## 2018-05-18 NOTE — Anesthesia Procedure Notes (Signed)
Procedure Name: LMA Insertion Date/Time: 05/18/2018 2:15 PM Performed by: Carmela RimaMartinelli, Gabriana Wilmott F, CRNA Pre-anesthesia Checklist: Timeout performed, Patient being monitored, Suction available, Emergency Drugs available and Patient identified Patient Re-evaluated:Patient Re-evaluated prior to induction Oxygen Delivery Method: Circle system utilized Preoxygenation: Pre-oxygenation with 100% oxygen Induction Type: IV induction Ventilation: Mask ventilation without difficulty LMA: LMA inserted LMA Size: 4.0 Number of attempts: 1 Placement Confirmation: breath sounds checked- equal and bilateral,  positive ETCO2 and ETT inserted through vocal cords under direct vision Tube secured with: Tape Dental Injury: Teeth and Oropharynx as per pre-operative assessment

## 2018-05-18 NOTE — Anesthesia Preprocedure Evaluation (Addendum)
Anesthesia Evaluation  Patient identified by MRN, date of birth, ID band Patient awake    Reviewed: Allergy & Precautions, H&P , NPO status , Patient's Chart, lab work & pertinent test results  History of Anesthesia Complications (+) PONV, MALIGNANT HYPERTHERMIA, Family history of anesthesia reaction and history of anesthetic complications  Airway Mallampati: II   Neck ROM: full    Dental   Pulmonary neg pulmonary ROS,    breath sounds clear to auscultation       Cardiovascular negative cardio ROS   Rhythm:regular Rate:Normal     Neuro/Psych Spina bifida. hydrocephalus    GI/Hepatic GERD  ,  Endo/Other  Morbid obesity  Renal/GU      Musculoskeletal  (+) Arthritis ,   Abdominal   Peds  Hematology   Anesthesia Other Findings Grandfather had malignant hyperthermia  Reproductive/Obstetrics                            Anesthesia Physical Anesthesia Plan  ASA: II  Anesthesia Plan: General and Regional   Post-op Pain Management:  Regional for Post-op pain   Induction: Intravenous  PONV Risk Score and Plan: 4 or greater and Ondansetron, Propofol infusion, Dexamethasone, Treatment may vary due to age or medical condition, Midazolam and TIVA  Airway Management Planned: LMA  Additional Equipment:   Intra-op Plan:   Post-operative Plan:   Informed Consent: I have reviewed the patients History and Physical, chart, labs and discussed the procedure including the risks, benefits and alternatives for the proposed anesthesia with the patient or authorized representative who has indicated his/her understanding and acceptance.     Plan Discussed with: Anesthesiologist, CRNA and Surgeon  Anesthesia Plan Comments:        Anesthesia Quick Evaluation

## 2018-05-18 NOTE — Anesthesia Procedure Notes (Signed)
Anesthesia Regional Block: Popliteal block   Pre-Anesthetic Checklist: ,, timeout performed, Correct Patient, Correct Site, Correct Laterality, Correct Procedure, Correct Position, site marked, Risks and benefits discussed,  Surgical consent,  Pre-op evaluation,  At surgeon's request and post-op pain management  Laterality: Right  Prep: chloraprep       Needles:  Injection technique: Single-shot  Needle Type: Echogenic Stimulator Needle          Additional Needles:   Procedures:, nerve stimulator,,,,,,,   Nerve Stimulator or Paresthesia:  Response: plantar flexion of foot, 0.45 mA,   Additional Responses:   Narrative:  Start time: 05/18/2018 1:10 PM End time: 05/18/2018 1:13 PM Injection made incrementally with aspirations every 5 mL.  Performed by: Personally   Additional Notes: Functioning IV was confirmed and monitors were applied.  A 90mm 21ga Arrow echogenic stimulator needle was used. Sterile prep and drape,hand hygiene and sterile gloves were used.  Negative aspiration and negative test dose prior to incremental administration of local anesthetic. The patient tolerated the procedure well.  Ultrasound guidance: relevent anatomy identified, needle position confirmed, local anesthetic spread visualized around nerve(s), vascular puncture avoided.  Image printed for medical record.

## 2018-05-18 NOTE — Discharge Instructions (Signed)
Orthopedic discharge instructions: -Perform daily pin site care with warm soapy water solution applied to each pin site once daily.  May wrap with a dry dressing following that care. -No weightbearing to the right lower extremity.  Keep right leg elevated at all times. -Resume your Lovenox once daily for prevention of blood clots. -Return to see Dr. Aundria Rudogers in 3 weeks for wound check and medial suture removal.

## 2018-05-19 DIAGNOSIS — S82851A Displaced trimalleolar fracture of right lower leg, initial encounter for closed fracture: Secondary | ICD-10-CM | POA: Diagnosis not present

## 2018-05-19 NOTE — Anesthesia Postprocedure Evaluation (Signed)
Anesthesia Post Note  Patient: [name]  Procedure(s) Performed: Right ankle open reduction internal fixation revision (Right ) HARDWARE REMOVAL (Right Ankle) EXTERNAL FIXATION LEG (Right Leg Lower)     Patient location during evaluation: PACU Anesthesia Type: General Level of consciousness: awake and alert Pain management: pain level controlled Vital Signs Assessment: post-procedure vital signs reviewed and stable Respiratory status: spontaneous breathing, nonlabored ventilation, respiratory function stable and patient connected to nasal cannula oxygen Cardiovascular status: blood pressure returned to baseline and stable Postop Assessment: no apparent nausea or vomiting Anesthetic complications: no    Last Vitals:  Vitals:   05/19/18 0501 05/19/18 1218  BP: 102/62 113/75  Pulse: 73 86  Resp: 12   Temp: 36.7 C 36.6 C  SpO2: 95% 97%    Last Pain:  Vitals:   05/19/18 1253  TempSrc:   PainSc: 4                  Aireanna Luellen S

## 2018-05-19 NOTE — Progress Notes (Signed)
   Subjective:  Patient reports pain as moderate.  Some breakthrough pain noted over night and this am.  Objective:   VITALS:   Vitals:   05/18/18 2008 05/19/18 0015 05/19/18 0501 05/19/18 1218  BP: 133/74 114/64 102/62 113/75  Pulse: 81 78 73 86  Resp: 18 13 12    Temp: 98 F (36.7 C) 98.4 F (36.9 C) 98 F (36.7 C) 97.8 F (36.6 C)  TempSrc: Oral Oral Oral Oral  SpO2: 96% 97% 95% 97%  Weight:      Height:        Ex fix in place right leg Bandages c/d/i Wiggles toes, but no SILT due to block Cap refill < 2 s  Lab Results  Component Value Date   WBC 5.3 05/07/2018   HGB 12.2 05/07/2018   HCT 38.1 05/07/2018   MCV 91.4 05/07/2018   PLT 386 05/07/2018   BMET    Component Value Date/Time   NA 141 05/07/2018 0739   K 3.6 05/07/2018 0739   CL 106 05/07/2018 0739   CO2 27 05/07/2018 0739   GLUCOSE 111 (H) 05/07/2018 0739   BUN 15 05/07/2018 0739   CREATININE 0.88 05/07/2018 0739   CALCIUM 9.0 05/07/2018 0739   GFRNONAA >60 05/07/2018 0739   GFRAA >60 05/07/2018 0739     Assessment/Plan: 1 Day Post-Op   Active Problems:   Closed displaced trimalleolar fracture of right ankle   Advance diet -NWB to RLE - up with assistance  - elevate the RLE  - SCD and Lovenox for DVT ppx - pin site care to start tomorrow - Dispo: likely dc tomorrow afternoon, once pain better controlled without ivs for breakthrough   [name] 05/19/2018, 12:26 PM   Renee RuedJason P Rogers, MD [phone]

## 2018-05-20 ENCOUNTER — Encounter (HOSPITAL_COMMUNITY): Payer: Self-pay | Admitting: Orthopedic Surgery

## 2018-05-20 DIAGNOSIS — S82851A Displaced trimalleolar fracture of right lower leg, initial encounter for closed fracture: Secondary | ICD-10-CM | POA: Diagnosis not present

## 2018-05-20 MED ORDER — OXYCODONE HCL 5 MG PO TABS: 5.0000 mg | ORAL_TABLET | Freq: Four times a day (QID) | ORAL | 0 refills | Status: DC | PRN

## 2018-05-20 NOTE — Progress Notes (Signed)
   Subjective:  Patient reports pain as moderate.  Required one iv pain dose over night.  Doing better today with some pain, managed by POs.  Block is resolving but still more numb than her baseline with spina bifida.  Objective:   VITALS:   Vitals:   05/19/18 0501 05/19/18 1218 05/19/18 2206 05/20/18 0501  BP: 102/62 113/75 (!) 107/57 105/65  Pulse: 73 86 74 73  Resp: 12  12 16   Temp: 98 F (36.7 C) 97.8 F (36.6 C) 98.6 F (37 C) 98.1 F (36.7 C)  TempSrc: Oral Oral Oral Oral  SpO2: 95% 97% 94% 95%  Weight:      Height:        Ex fix in place right leg Bandages c/d/i Wiggles toes, but decreased ST in dp and Sp, intact TN, but more decreased than her baseline. Cap refill < 2 s  Lab Results  Component Value Date   WBC 5.3 05/07/2018   HGB 12.2 05/07/2018   HCT 38.1 05/07/2018   MCV 91.4 05/07/2018   PLT 386 05/07/2018   BMET    Component Value Date/Time   NA 141 05/07/2018 0739   K 3.6 05/07/2018 0739   CL 106 05/07/2018 0739   CO2 27 05/07/2018 0739   GLUCOSE 111 (H) 05/07/2018 0739   BUN 15 05/07/2018 0739   CREATININE 0.88 05/07/2018 0739   CALCIUM 9.0 05/07/2018 0739   GFRNONAA >60 05/07/2018 0739   GFRAA >60 05/07/2018 0739     Assessment/Plan: 2 Days Post-Op   Active Problems:   Closed displaced trimalleolar fracture of right ankle   Advance diet -NWB to RLE - up with assistance  - elevate the RLE  - SCD and Lovenox for DVT ppx - pin site care today with dresssing change prior to dc home - Dispo: likely dc today  - follow up 3 weeks with Aundria Rudogers.   Yolonda KidaJason Patrick Abygayle Deltoro 05/20/2018, 1:13 PM   Maryan RuedJason P Josip Merolla, MD [phone]

## 2018-05-20 NOTE — Progress Notes (Signed)
Pt and spouse demonstrated understanding of wound cleaning and dressing changes. Pt and spouse verbalized understanding of medication administration, follow up appt, wound care, and when to call doctor and come to the emergency department for emergencies.

## 2018-05-21 NOTE — Discharge Summary (Signed)
Patient ID: [name] MRN: [id number] DOB/AGE: [birth year] 47 y.o.  Admit date: 05/18/2018 Discharge date: 05/20/2018  Primary Diagnosis: Right trimalleolar ankle fracture  Admission Diagnoses:  Past Medical History:  Diagnosis Date  . 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 Family history of adverse reaction to anesthesia    grandfather died from anesthesia; grandfather had malignant hyperthermia  . GERD (gastroesophageal reflux disease)   . Hydrocephalus   . Malignant hyperthermia   . Osteoarthritis   . PONV (postoperative nausea and vomiting)   . Spina bifida [facility])    Discharge Diagnoses:   Active Problems:   Closed displaced trimalleolar fracture of right ankle  Estimated body mass index is 52.34 kg/m as calculated from the following:   Height as of this encounter: 5' (1.524 m).   Weight as of this encounter: 121.6 kg (268 lb).  Procedure:  Procedure(s) (LRB): Right ankle open reduction internal fixation revision (Right) HARDWARE REMOVAL (Right) EXTERNAL FIXATION LEG (Right)   Consults: None  HPI:  [name] presented to the hospital for revision of her right ankle.  She had a trimalleolar ankle fracture fixed about 3 weeks ago.  She was noted to have lost reduction during the postoperative period.  Recommendation was for selective hardware removal and Placement of external fixator. Laboratory Data: Admission on 05/18/2018, Discharged on 05/20/2018  Component Date Value Ref Range Status  . Preg, Serum 05/18/2018 NEGATIVE  NEGATIVE Final   Comment:        THE SENSITIVITY OF THIS METHODOLOGY IS >10 mIU/mL. Performed at [facility], [place] [address], [place], [place] [address]   Admission on 04/29/2018, Discharged on 05/12/2018  Component Date Value Ref Range Status  . WBC 04/29/2018 7.0  4.0 - 10.5 K/uL Final  . RBC 04/29/2018 4.29  3.87 - 5.11 MIL/uL Final  . Hemoglobin 04/29/2018 12.6  12.0 - 15.0 g/dL Final  . HCT 04/29/2018 38.9  36.0 - 46.0 % Final    . MCV 04/29/2018 90.7  78.0 - 100.0 fL Final  . MCH 04/29/2018 29.4  26.0 - 34.0 pg Final  . MCHC 04/29/2018 32.4  30.0 - 36.0 g/dL Final  . RDW 04/29/2018 13.3  11.5 - 15.5 % Final  . Platelets 04/29/2018 416* 150 - 400 K/uL Final   Performed at [facility], [place] [address] [place], [place] [address]  . Creatinine, Ser 04/29/2018 0.79  0.44 - 1.00 mg/dL Final  . GFR calc non Af Amer 04/29/2018 >60  >60 mL/min Final  . GFR calc Af Amer 04/29/2018 >60  >60 mL/min Final   Comment: (NOTE) The eGFR has been calculated using the CKD EPI equation. This calculation has not been validated in all clinical situations. eGFR's persistently <60 mL/min signify possible Chronic Kidney Disease. Performed at [facility], [place] [address], [place], [place] [address]   . WBC 04/30/2018 6.8  4.0 - 10.5 K/uL Final  . RBC 04/30/2018 4.04  3.87 - 5.11 MIL/uL Final  . Hemoglobin 04/30/2018 11.8* 12.0 - 15.0 g/dL Final  . HCT 04/30/2018 37.3  36.0 - 46.0 % Final  . MCV 04/30/2018 92.3  78.0 - 100.0 fL Final  . MCH 04/30/2018 29.2  26.0 - 34.0 pg Final  . MCHC 04/30/2018 31.6  30.0 - 36.0 g/dL Final  . RDW 04/30/2018 13.3  11.5 - 15.5 % Final  . Platelets 04/30/2018 363  150 - 400 K/uL Final  . Neutrophils Relative % 04/30/2018 51  % Final  .  Neutro Abs 04/30/2018 3.5  1.7 - 7.7 K/uL Final  . Lymphocytes Relative 04/30/2018 34  % Final  . Lymphs Abs 04/30/2018 2.3  0.7 - 4.0 K/uL Final  . Monocytes Relative 04/30/2018 10  % Final  . Monocytes Absolute 04/30/2018 0.7  0.1 - 1.0 K/uL Final  . Eosinophils Relative 04/30/2018 4  % Final  . Eosinophils Absolute 04/30/2018 0.3  0.0 - 0.7 K/uL Final  . Basophils Relative 04/30/2018 1  % Final  . Basophils Absolute 04/30/2018 0.0  0.0 - 0.1 K/uL Final  . Immature Granulocytes 04/30/2018 0  % Final  . Abs Immature Granulocytes 04/30/2018 0.0  0.0 - 0.1 K/uL Final   Performed at [facility], [place] [address] [place], [place] [address]  .  Sodium 04/30/2018 135  135 - 145 mmol/L Final  . Potassium 04/30/2018 4.2  3.5 - 5.1 mmol/L Final  . Chloride 04/30/2018 100* 101 - 111 mmol/L Final  . CO2 04/30/2018 24  22 - 32 mmol/L Final  . Glucose, Bld 04/30/2018 88  65 - 99 mg/dL Final  . BUN 04/30/2018 13  6 - 20 mg/dL Final  . Creatinine, Ser 04/30/2018 0.72  0.44 - 1.00 mg/dL Final  . Calcium 04/30/2018 8.7* 8.9 - 10.3 mg/dL Final  . Total Protein 04/30/2018 6.1* 6.5 - 8.1 g/dL Final  . Albumin 04/30/2018 2.9* 3.5 - 5.0 g/dL Final  . AST 04/30/2018 18  15 - 41 U/L Final  . ALT 04/30/2018 21  14 - 54 U/L Final  . Alkaline Phosphatase 04/30/2018 94  38 - 126 U/L Final  . Total Bilirubin 04/30/2018 0.6  0.3 - 1.2 mg/dL Final  . GFR calc non Af Amer 04/30/2018 >60  >60 mL/min Final  . GFR calc Af Amer 04/30/2018 >60  >60 mL/min Final   Comment: (NOTE) The eGFR has been calculated using the CKD EPI equation. This calculation has not been validated in all clinical situations. eGFR's persistently <60 mL/min signify possible Chronic Kidney Disease.   . Anion gap 04/30/2018 11  5 - 15 Final   Performed at [facility], [place] [address] [place], [place] [address]  . Sodium 05/07/2018 141  135 - 145 mmol/L Final  . Potassium 05/07/2018 3.6  3.5 - 5.1 mmol/L Final  . Chloride 05/07/2018 106  101 - 111 mmol/L Final  . CO2 05/07/2018 27  22 - 32 mmol/L Final  . Glucose, Bld 05/07/2018 111* 65 - 99 mg/dL Final  . BUN 05/07/2018 15  6 - 20 mg/dL Final  . Creatinine, Ser 05/07/2018 0.88  0.44 - 1.00 mg/dL Final  . Calcium 05/07/2018 9.0  8.9 - 10.3 mg/dL Final  . GFR calc non Af Amer 05/07/2018 >60  >60 mL/min Final  . GFR calc Af Amer 05/07/2018 >60  >60 mL/min Final   Comment: (NOTE) The eGFR has been calculated using the CKD EPI equation. This calculation has not been validated in all clinical situations. eGFR's persistently <60 mL/min signify possible Chronic Kidney Disease.   Georgiann Hahn gap 05/07/2018 8  5 - 15 Final    Performed at [facility], [place] [address], [place], [place] [address]  . WBC 05/07/2018 5.3  4.0 - 10.5 K/uL Final  . RBC 05/07/2018 4.17  3.87 - 5.11 MIL/uL Final  . Hemoglobin 05/07/2018 12.2  12.0 - 15.0 g/dL Final  . HCT 05/07/2018 38.1  36.0 - 46.0 % Final  . MCV 05/07/2018 91.4  78.0 - 100.0  fL Final  . MCH 05/07/2018 29.3  26.0 - 34.0 pg Final  . MCHC 05/07/2018 32.0  30.0 - 36.0 g/dL Final  . RDW 05/07/2018 13.2  11.5 - 15.5 % Final  . Platelets 05/07/2018 386  150 - 400 K/uL Final   Performed at [facility], [place] [address], [place], [place] [address]  Admission on 04/23/2018, Discharged on 04/29/2018  Component Date Value Ref Range Status  . WBC 04/21/2018 8.8  4.0 - 10.5 K/uL Final  . RBC 04/21/2018 4.76  3.87 - 5.11 MIL/uL Final  . Hemoglobin 04/21/2018 14.3  12.0 - 15.0 g/dL Final  . HCT 04/21/2018 43.5  36.0 - 46.0 % Final  . MCV 04/21/2018 91.4  78.0 - 100.0 fL Final  . MCH 04/21/2018 30.0  26.0 - 34.0 pg Final  . MCHC 04/21/2018 32.9  30.0 - 36.0 g/dL Final  . RDW 04/21/2018 13.6  11.5 - 15.5 % Final  . Platelets 04/21/2018 351  150 - 400 K/uL Final   Performed at [facility], [place] [address] [place], [place] [address]  . Sodium 04/21/2018 139  135 - 145 mmol/L Final  . Potassium 04/21/2018 4.8  3.5 - 5.1 mmol/L Final  . Chloride 04/21/2018 104  101 - 111 mmol/L Final  . CO2 04/21/2018 23  22 - 32 mmol/L Final  . Glucose, Bld 04/21/2018 99  65 - 99 mg/dL Final  . BUN 04/21/2018 22* 6 - 20 mg/dL Final  . Creatinine, Ser 04/21/2018 0.88  0.44 - 1.00 mg/dL Final  . Calcium 04/21/2018 9.6  8.9 - 10.3 mg/dL Final  . GFR calc non Af Amer 04/21/2018 >60  >60 mL/min Final  . GFR calc Af Amer 04/21/2018 >60  >60 mL/min Final   Comment: (NOTE) The eGFR has been calculated using the CKD EPI equation. This calculation has not been validated in all clinical situations. eGFR's persistently <60 mL/min signify possible Chronic Kidney Disease.   Georgiann Hahn gap 04/21/2018 12  5 - 15 Final   Performed at [facility], [place] [address] [place], [place] [address]  . HIV Screen 4th Generation wRfx 04/23/2018 Non Reactive  Non Reactive Final   Comment: (NOTE) Performed At: [facility] [place], [place] [id number] Rush Farmer MD [id number] Performed at [facility], [place] [address], [place], [place] [address]   . WBC 04/23/2018 11.4* 4.0 - 10.5 K/uL Final  . RBC 04/23/2018 4.50  3.87 - 5.11 MIL/uL Final  . Hemoglobin 04/23/2018 13.7  12.0 - 15.0 g/dL Final  . HCT 04/23/2018 41.4  36.0 - 46.0 % Final  . MCV 04/23/2018 92.0  78.0 - 100.0 fL Final  . MCH 04/23/2018 30.4  26.0 - 34.0 pg Final  . MCHC 04/23/2018 33.1  30.0 - 36.0 g/dL Final  . RDW 04/23/2018 13.5  11.5 - 15.5 % Final  . Platelets 04/23/2018 328  150 - 400 K/uL Final   Performed at [facility], [place] [address], [place], [place] [address]  . Creatinine, Ser 04/23/2018 0.85  0.44 - 1.00 mg/dL Final  . GFR calc non Af Amer 04/23/2018 >60  >60 mL/min Final  . GFR calc Af Amer 04/23/2018 >60  >60 mL/min Final   Comment: (NOTE) The eGFR has been calculated using the CKD EPI equation. This calculation has not been validated in all clinical situations. eGFR's persistently <60 mL/min signify possible Chronic Kidney Disease. Performed at [facility],  [place] [address] [place], [place] [address]   . Triglycerides 04/29/2018 92  <150 mg/dL Final   Performed at [facility], [place] [address] [place], [place] [address]  Hospital Outpatient Visit on 04/21/2018  Component Date Value Ref Range Status  . Preg, Serum 04/21/2018 NEGATIVE  NEGATIVE Final   Comment:        THE SENSITIVITY OF THIS METHODOLOGY IS >10 mIU/mL. Performed at [facility], [place] [address], [place], [place] [address]      X-Rays:Dg Ankle 2 Views Right  Result Date:  05/18/2018 CLINICAL DATA:  Revision and external fixation of RIGHT tibial and fibular fractures EXAM: RIGHT ANKLE - 2 VIEW; DG C-ARM 61-120 MIN COMPARISON:  04/23/2018 FLUOROSCOPY TIME:  0 minutes 37 seconds Images obtained: 5 FINDINGS: Images demonstrate previously placed lateral plate and screws at the distal RIGHT fibula post ORIF. Two screws have been removed from the medial malleolus. Minimal displacement of the medial malleolar fracture fragment slight widening of the medial joint line. External fixation screw traverses the calcaneus as well as the tibials diaphysis. Suture anchors again noted at the medial distal tibial metaphysis. Skin clips and soft tissue swelling at medial ankle. Bones appear demineralized. IMPRESSION: Interval removal of medial malleolar screws and placement of external fixation. Slight lateral displacement of the medial malleolar fracture fragment with slight widening at the medial ankle joint. Electronically Signed   By: Lavonia Dana M.D.   On: 05/18/2018 15:52   Dg Ankle 2 Views Right  Result Date: 05/03/2018 CLINICAL DATA:  Persistent ankle pain EXAM: RIGHT ANKLE - 2 VIEW COMPARISON:  04/23/2018 FINDINGS: Casting material is noted in place. Postsurgical changes are noted in the distal tibia and fibula similar to that seen on the prior intraoperative exam. Fracture fragments are in near anatomic alignment. The overall appearance is similar to that seen intraoperatively. No new focal abnormality is seen. IMPRESSION: Stable appearance of distal fibular and tibial fractures following fixation. Electronically Signed   By: Inez Catalina M.D.   On: 05/03/2018 15:46   Dg Ankle 2 Views Right  Result Date: 04/23/2018 CLINICAL DATA:  Status post ankle repair EXAM: RIGHT ANKLE - 2 VIEW COMPARISON:  04/12/2018 FLUOROSCOPY TIME:  Radiation Exposure Index (as provided by the fluoroscopic device): 4.26 mGy If the device does not provide the exposure index: Fluoroscopy Time:  73 seconds Number  of Acquired Images:  3 FINDINGS: Fixation sideplate is noted along the distal fibula with the fracture fragments in anatomic position. Suture based fixation devices are noted traversing the tibia and fibula. Two fixation screws are noted extending into the medial malleolus. IMPRESSION: ORIF of distal tibial and fibular fractures. Electronically Signed   By: Inez Catalina M.D.   On: 04/23/2018 09:39   Dg Ankle Complete Right  Result Date: 05/10/2018 CLINICAL DATA:  Ankle fracture EXAM: RIGHT ANKLE - COMPLETE 3+ VIEW COMPARISON:  Plain film of the RIGHT ankle dated 05/03/2018. FINDINGS: Overlying casting remains in place. Osseous alignment is stable. Fixation hardware is stable in position within the distal tibia and fibula. IMPRESSION: No interval change appreciated. Osseous alignment appears stable. Fixation hardware within the distal RIGHT tibia and fibula appear stable in position. Electronically Signed   By: Franki Cabot M.D.   On: 05/10/2018 16:52   Dg C-arm 1-60 Min  Result Date: 05/18/2018 CLINICAL DATA:  Revision and external fixation of RIGHT tibial and fibular fractures EXAM: RIGHT ANKLE - 2 VIEW; DG C-ARM 61-120 MIN COMPARISON:  04/23/2018 FLUOROSCOPY  TIME:  0 minutes 37 seconds Images obtained: 5 FINDINGS: Images demonstrate previously placed lateral plate and screws at the distal RIGHT fibula post ORIF. Two screws have been removed from the medial malleolus. Minimal displacement of the medial malleolar fracture fragment slight widening of the medial joint line. External fixation screw traverses the calcaneus as well as the tibials diaphysis. Suture anchors again noted at the medial distal tibial metaphysis. Skin clips and soft tissue swelling at medial ankle. Bones appear demineralized. IMPRESSION: Interval removal of medial malleolar screws and placement of external fixation. Slight lateral displacement of the medial malleolar fracture fragment with slight widening at the medial ankle joint.  Electronically Signed   By: Lavonia Dana M.D.   On: 05/18/2018 15:52   Dg C-arm 1-60 Min-no Report  Result Date: 04/23/2018 Fluoroscopy was utilized by the requesting physician.  No radiographic interpretation.    EKG:No orders found for this or any previous visit.   Hospital Course: [name] is a 47 y.o. who was admitted to Hospital. They were brought to the operating room on 05/18/2018 and underwent Procedure(s): Right ankle open reduction internal fixation revision HARDWARE REMOVAL EXTERNAL FIXATION LEG.  Patient tolerated the procedure well and was later transferred to the recovery room and then to the orthopaedic floor for postoperative care.  They were given PO and IV analgesics for pain control following their surgery.  They were given 24 hours of postoperative antibiotics of  Anti-infectives (From admission, onward)   Start     Dose/Rate Route Frequency Ordered Stop   05/18/18 2200  ceFAZolin (ANCEF) IVPB 1 g/50 mL premix     1 g 100 mL/hr over 30 Minutes Intravenous Every 8 hours 05/18/18 1811 05/19/18 0545   05/18/18 1200  ceFAZolin (ANCEF) 3 g in dextrose 5 % 50 mL IVPB     3 g 100 mL/hr over 30 Minutes Intravenous To Short Stay 05/17/18 0758 05/18/18 1409     and started on DVT prophylaxis in the form of Lovenox.   PT and OT were ordered. She had some breakthrough pain on the first postoperative night and day.  This was better managed on her second postoperative day.  The nurses worked with her as well on pin site care and wound dressing changes. Incision was healing well.  Patient was seen in rounds and was ready to go home.   Diet: Regular diet Activity:NWB Follow-up:in 3 weeks Disposition - Home Discharged Condition: good   Discharge Instructions    Call MD / Call 911   Complete by:  As directed    If you experience chest pain or shortness of breath, CALL 911 and be transported to the hospital emergency room.  If you develope a fever above 101 F, pus (white  drainage) or increased drainage or redness at the wound, or calf pain, call your surgeon's office.   Constipation Prevention   Complete by:  As directed    Drink plenty of fluids.  Prune juice may be helpful.  You may use a stool softener, such as Colace (over the counter) 100 mg twice a day.  Use MiraLax (over the counter) for constipation as needed.   Diet - low sodium heart healthy   Complete by:  As directed    Increase activity slowly as tolerated   Complete by:  As directed      Allergies as of 05/20/2018      Reactions   Anesthetics, Amide Other (See Comments)   Pt has Malignant Hyperthermia  which is an allergy to general anesthesia   Gabapentin Other (See Comments)   Depression.   Percocet [oxycodone-acetaminophen] Itching   Latex Rash   Other Rash, Other (See Comments)   Latex Adhesive tape: prefers to use paper tape      Medication List    TAKE these medications   acetaminophen 500 MG tablet Commonly known as:  TYLENOL Take 1,000 mg by mouth every 6 (six) hours as needed for moderate pain or headache.   docusate sodium 100 MG capsule Commonly known as:  COLACE Take 1 capsule (100 mg total) by mouth 2 (two) times daily. What changed:    when to take this  reasons to take this   DULoxetine 30 MG capsule Commonly known as:  CYMBALTA Take 1 capsule (30 mg total) by mouth at bedtime.   enoxaparin 40 MG/0.4ML injection Commonly known as:  LOVENOX Inject 0.4 mLs (40 mg total) into the skin daily.   HYDROcodone-acetaminophen 7.5-325 MG tablet Commonly known as:  NORCO Take 1 tablet by mouth every 6 (six) hours as needed (breakthrough pain).   ibuprofen 200 MG tablet Commonly known as:  ADVIL,MOTRIN Take 400-600 mg by mouth every 8 (eight) hours as needed for moderate pain.   methocarbamol 500 MG tablet Commonly known as:  ROBAXIN Take 1 tablet (500 mg total) by mouth every 6 (six) hours as needed for muscle spasms.   oxyCODONE 5 MG immediate release  tablet Commonly known as:  ROXICODONE Take 1-2 tablets (5-10 mg total) by mouth every 6 (six) hours as needed for severe pain.      Follow-up Information    Nicholes Stairs, MD. Schedule an appointment as soon as possible for a visit in 3 weeks.   Specialty:  Orthopedic Surgery Why:  For wound re-check Contact information: [address] [place] [address] [phone]           Signed: Geralynn Rile, MD Orthopaedic Surgery 05/21/2018, 11:34 AM

## 2018-05-26 ENCOUNTER — Other Ambulatory Visit: Payer: Self-pay | Admitting: Physical Medicine and Rehabilitation

## 2018-06-11 ENCOUNTER — Encounter (HOSPITAL_COMMUNITY): Payer: Self-pay | Admitting: *Deleted

## 2018-06-11 ENCOUNTER — Other Ambulatory Visit: Payer: Self-pay

## 2018-06-11 NOTE — Progress Notes (Signed)
Pt denies recent labs. 

## 2018-06-11 NOTE — Progress Notes (Signed)
Pt has a history of Malignant Hyperthermia. Pt denies SOB, chest pain, and being under the care of a cardiologist. Pt denies having a stress test and cardiac cath but stated that an echo was performed > 20 years ago. Pt denies having an EKG and chest x ray within the last year. Pt made aware to stop taking Aspirin ( unless instructed otherwise by surgeon) , vitamins, fish oil and herbal medications. Do not take any NSAIDs ie: Ibuprofen, Advil, Naproxen (Aleve), Motrin, BC and Goody Powder. Pt stated that she was instructed to not take Lovenox on Sunday night (the night before surgery). Pt verbalized understanding of all pre-op instructions.

## 2018-06-14 ENCOUNTER — Ambulatory Visit (HOSPITAL_COMMUNITY): Payer: No Typology Code available for payment source

## 2018-06-14 ENCOUNTER — Encounter (HOSPITAL_COMMUNITY): Payer: Self-pay

## 2018-06-14 ENCOUNTER — Observation Stay (HOSPITAL_COMMUNITY)
Admission: RE | Admit: 2018-06-14 | Discharge: 2018-06-15 | Disposition: A | Discharge status: Home / Self Care | Payer: No Typology Code available for payment source | Source: Ambulatory Visit | Attending: Orthopedic Surgery | Admitting: Orthopedic Surgery

## 2018-06-14 ENCOUNTER — Encounter (HOSPITAL_COMMUNITY)
Admission: RE | Disposition: A | Discharge status: Home / Self Care | Payer: Self-pay | Source: Ambulatory Visit | Attending: Orthopedic Surgery

## 2018-06-14 ENCOUNTER — Ambulatory Visit (HOSPITAL_COMMUNITY): Payer: No Typology Code available for payment source | Admitting: Anesthesiology

## 2018-06-14 ENCOUNTER — Other Ambulatory Visit: Payer: Self-pay

## 2018-06-14 DIAGNOSIS — M1990 Unspecified osteoarthritis, unspecified site: Secondary | ICD-10-CM | POA: Insufficient documentation

## 2018-06-14 DIAGNOSIS — Z885 Allergy status to narcotic agent status: Secondary | ICD-10-CM | POA: Insufficient documentation

## 2018-06-14 DIAGNOSIS — Z79899 Other long term (current) drug therapy: Secondary | ICD-10-CM | POA: Insufficient documentation

## 2018-06-14 DIAGNOSIS — Z6841 Body Mass Index (BMI) 40.0 and over, adult: Secondary | ICD-10-CM | POA: Insufficient documentation

## 2018-06-14 DIAGNOSIS — K219 Gastro-esophageal reflux disease without esophagitis: Secondary | ICD-10-CM | POA: Diagnosis not present

## 2018-06-14 DIAGNOSIS — D649 Anemia, unspecified: Secondary | ICD-10-CM | POA: Diagnosis not present

## 2018-06-14 DIAGNOSIS — X58XXXD Exposure to other specified factors, subsequent encounter: Secondary | ICD-10-CM | POA: Insufficient documentation

## 2018-06-14 DIAGNOSIS — S82851D Displaced trimalleolar fracture of right lower leg, subsequent encounter for closed fracture with routine healing: Secondary | ICD-10-CM | POA: Diagnosis not present

## 2018-06-14 DIAGNOSIS — Z8489 Family history of other specified conditions: Secondary | ICD-10-CM | POA: Insufficient documentation

## 2018-06-14 DIAGNOSIS — Q0703 Arnold-Chiari syndrome with spina bifida and hydrocephalus: Secondary | ICD-10-CM | POA: Insufficient documentation

## 2018-06-14 DIAGNOSIS — Z807 Family history of other malignant neoplasms of lymphoid, hematopoietic and related tissues: Secondary | ICD-10-CM | POA: Diagnosis not present

## 2018-06-14 DIAGNOSIS — Z419 Encounter for procedure for purposes other than remedying health state, unspecified: Secondary | ICD-10-CM

## 2018-06-14 DIAGNOSIS — Z888 Allergy status to other drugs, medicaments and biological substances status: Secondary | ICD-10-CM | POA: Insufficient documentation

## 2018-06-14 DIAGNOSIS — Z91040 Latex allergy status: Secondary | ICD-10-CM | POA: Insufficient documentation

## 2018-06-14 DIAGNOSIS — Z8249 Family history of ischemic heart disease and other diseases of the circulatory system: Secondary | ICD-10-CM | POA: Insufficient documentation

## 2018-06-14 DIAGNOSIS — S82891A Other fracture of right lower leg, initial encounter for closed fracture: Secondary | ICD-10-CM | POA: Diagnosis present

## 2018-06-14 DIAGNOSIS — Z884 Allergy status to anesthetic agent status: Secondary | ICD-10-CM | POA: Insufficient documentation

## 2018-06-14 HISTORY — PX: EXTERNAL FIXATION REMOVAL: SHX5040

## 2018-06-14 LAB — BASIC METABOLIC PANEL
Anion gap: 11 (ref 5–15)
BUN: 12 mg/dL (ref 6–20)
CHLORIDE: 103 mmol/L (ref 98–111)
CO2: 24 mmol/L (ref 22–32)
Calcium: 9.4 mg/dL (ref 8.9–10.3)
Creatinine, Ser: 0.95 mg/dL (ref 0.44–1.00)
GFR calc Af Amer: >60 mL/min (ref >60)
GLUCOSE: 91 mg/dL (ref 70–99)
POTASSIUM: 3.9 mmol/L (ref 3.5–5.1)
Sodium: 138 mmol/L (ref 135–145)

## 2018-06-14 LAB — HCG, SERUM, QUALITATIVE: Preg, Serum: NEGATIVE

## 2018-06-14 LAB — CBC
HCT: 42.6 % (ref 36.0–46.0)
Hemoglobin: 13.4 g/dL (ref 12.0–15.0)
MCH: 28.9 pg (ref 26.0–34.0)
MCHC: 31.5 g/dL (ref 30.0–36.0)
MCV: 92 fL (ref 78.0–100.0)
PLATELETS: 351 10*3/uL (ref 150–400)
RBC: 4.63 MIL/uL (ref 3.87–5.11)
RDW: 13 % (ref 11.5–15.5)
WBC: 8.5 10*3/uL (ref 4.0–10.5)

## 2018-06-14 LAB — PROTIME-INR
INR: 0.95
Prothrombin Time: 12.6 seconds (ref 11.4–15.2)

## 2018-06-14 SURGERY — REMOVAL, EXTERNAL FIXATION DEVICE, LOWER EXTREMITY
Anesthesia: Regional | Site: Ankle | Laterality: Right

## 2018-06-14 MED ORDER — MIDAZOLAM HCL 2 MG/2ML IJ SOLN
2.0000 mg | Freq: Once | INTRAMUSCULAR | Status: AC
  Administered 2018-06-14: 2 mg via INTRAVENOUS
  Filled 2018-06-14 (×2): qty 2

## 2018-06-14 MED ORDER — ONDANSETRON HCL 4 MG/2ML IJ SOLN
INTRAMUSCULAR | Status: AC
  Filled 2018-06-14: qty 2

## 2018-06-14 MED ORDER — ONDANSETRON HCL 4 MG PO TABS: 4.0000 mg | ORAL_TABLET | Freq: Four times a day (QID) | ORAL | Status: DC | PRN

## 2018-06-14 MED ORDER — MUPIROCIN 2 % EX OINT
1.0000 "application " | TOPICAL_OINTMENT | Freq: Two times a day (BID) | CUTANEOUS | Status: DC
  Administered 2018-06-15: 1 via NASAL
  Filled 2018-06-14: qty 22

## 2018-06-14 MED ORDER — HYDROCODONE-ACETAMINOPHEN 7.5-325 MG PO TABS: 1.0000 | ORAL_TABLET | ORAL | Status: DC | PRN

## 2018-06-14 MED ORDER — MIDAZOLAM HCL 5 MG/5ML IJ SOLN
INTRAMUSCULAR | Status: DC | PRN
  Administered 2018-06-14 (×2): 1 mg via INTRAVENOUS

## 2018-06-14 MED ORDER — ENOXAPARIN SODIUM 40 MG/0.4ML ~~LOC~~ SOLN
40.0000 mg | SUBCUTANEOUS | Status: DC
  Administered 2018-06-15: 40 mg via SUBCUTANEOUS
  Filled 2018-06-14: qty 0.4

## 2018-06-14 MED ORDER — DIPHENHYDRAMINE HCL 12.5 MG/5ML PO ELIX: 12.5000 mg | ORAL_SOLUTION | ORAL | Status: DC | PRN

## 2018-06-14 MED ORDER — BUPIVACAINE-EPINEPHRINE 0.25% -1:200000 IJ SOLN
INTRAMUSCULAR | Status: DC | PRN
  Administered 2018-06-14: 20 mL

## 2018-06-14 MED ORDER — HYDROCODONE-ACETAMINOPHEN 7.5-325 MG PO TABS: 1.0000 | ORAL_TABLET | ORAL | 0 refills | Status: AC | PRN

## 2018-06-14 MED ORDER — ACETAMINOPHEN 650 MG RE SUPP: 650.0000 mg | Freq: Four times a day (QID) | RECTAL | Status: DC | PRN

## 2018-06-14 MED ORDER — HYDROMORPHONE HCL 1 MG/ML IJ SOLN
0.2500 mg | INTRAMUSCULAR | Status: DC | PRN
  Administered 2018-06-14: 0.5 mg via INTRAVENOUS

## 2018-06-14 MED ORDER — DEXAMETHASONE SODIUM PHOSPHATE 4 MG/ML IJ SOLN
INTRAMUSCULAR | Status: DC | PRN
  Administered 2018-06-14: 5 mg via INTRAVENOUS

## 2018-06-14 MED ORDER — LIDOCAINE 2% (20 MG/ML) 5 ML SYRINGE
INTRAMUSCULAR | Status: AC
Start: 2018-06-14 — End: ?
  Filled 2018-06-14: qty 5

## 2018-06-14 MED ORDER — MORPHINE SULFATE (PF) 2 MG/ML IV SOLN: 2.0000 mg | INTRAVENOUS | Status: DC | PRN

## 2018-06-14 MED ORDER — PROPOFOL 10 MG/ML IV BOLUS
INTRAVENOUS | Status: AC
  Filled 2018-06-14: qty 20

## 2018-06-14 MED ORDER — PROMETHAZINE HCL 25 MG/ML IJ SOLN: 6.2500 mg | INTRAMUSCULAR | Status: DC | PRN

## 2018-06-14 MED ORDER — FENTANYL CITRATE (PF) 100 MCG/2ML IJ SOLN
INTRAMUSCULAR | Status: DC | PRN
  Administered 2018-06-14 (×2): 25 ug via INTRAVENOUS
  Administered 2018-06-14: 50 ug via INTRAVENOUS

## 2018-06-14 MED ORDER — CLONIDINE HCL (ANALGESIA) 100 MCG/ML EP SOLN
EPIDURAL | Status: DC | PRN
  Administered 2018-06-14: 50 ug

## 2018-06-14 MED ORDER — ONDANSETRON HCL 4 MG/2ML IJ SOLN
INTRAMUSCULAR | Status: DC | PRN
  Administered 2018-06-14: 4 mg via INTRAVENOUS

## 2018-06-14 MED ORDER — DEXAMETHASONE SODIUM PHOSPHATE 10 MG/ML IJ SOLN
INTRAMUSCULAR | Status: AC
  Filled 2018-06-14: qty 1

## 2018-06-14 MED ORDER — 0.9 % SODIUM CHLORIDE (POUR BTL) OPTIME
TOPICAL | Status: DC | PRN
  Administered 2018-06-14: 1000 mL

## 2018-06-14 MED ORDER — CHLORHEXIDINE GLUCONATE 4 % EX LIQD: 60.0000 mL | Freq: Once | CUTANEOUS | Status: DC

## 2018-06-14 MED ORDER — DOCUSATE SODIUM 100 MG PO CAPS
100.0000 mg | ORAL_CAPSULE | Freq: Two times a day (BID) | ORAL | Status: DC
  Administered 2018-06-14 – 2018-06-15 (×2): 100 mg via ORAL
  Filled 2018-06-14 (×2): qty 1

## 2018-06-14 MED ORDER — FENTANYL CITRATE (PF) 250 MCG/5ML IJ SOLN
INTRAMUSCULAR | Status: AC
  Filled 2018-06-14: qty 5

## 2018-06-14 MED ORDER — BUPIVACAINE-EPINEPHRINE (PF) 0.5% -1:200000 IJ SOLN
INTRAMUSCULAR | Status: DC | PRN
  Administered 2018-06-14 (×2): 30 mL

## 2018-06-14 MED ORDER — CYCLOBENZAPRINE HCL 10 MG PO TABS: 10.0000 mg | ORAL_TABLET | Freq: Three times a day (TID) | ORAL | Status: DC | PRN

## 2018-06-14 MED ORDER — FENTANYL CITRATE (PF) 100 MCG/2ML IJ SOLN
100.0000 ug | Freq: Once | INTRAMUSCULAR | Status: AC
  Administered 2018-06-14: 100 ug via INTRAVENOUS
  Filled 2018-06-14 (×2): qty 2

## 2018-06-14 MED ORDER — ACETAMINOPHEN 325 MG PO TABS
650.0000 mg | ORAL_TABLET | Freq: Four times a day (QID) | ORAL | Status: DC | PRN
  Administered 2018-06-14: 650 mg via ORAL
  Filled 2018-06-14: qty 2

## 2018-06-14 MED ORDER — DEXTROSE 5 % IV SOLN
3.0000 g | INTRAVENOUS | Status: AC
  Administered 2018-06-14: 3 g via INTRAVENOUS
  Filled 2018-06-14: qty 3

## 2018-06-14 MED ORDER — ONDANSETRON HCL 4 MG/2ML IJ SOLN: 4.0000 mg | Freq: Four times a day (QID) | INTRAMUSCULAR | Status: DC | PRN

## 2018-06-14 MED ORDER — IBUPROFEN 400 MG PO TABS: 400.0000 mg | ORAL_TABLET | Freq: Three times a day (TID) | ORAL | Status: DC | PRN

## 2018-06-14 MED ORDER — LACTATED RINGERS IV SOLN
INTRAVENOUS | Status: DC | PRN
  Administered 2018-06-14: 12:00:00 via INTRAVENOUS

## 2018-06-14 MED ORDER — HYDROMORPHONE HCL 1 MG/ML IJ SOLN
INTRAMUSCULAR | Status: AC
  Filled 2018-06-14: qty 1

## 2018-06-14 MED ORDER — PROPOFOL 10 MG/ML IV BOLUS
INTRAVENOUS | Status: DC | PRN
  Administered 2018-06-14: 110 mg via INTRAVENOUS

## 2018-06-14 MED ORDER — DULOXETINE HCL 30 MG PO CPEP
30.0000 mg | ORAL_CAPSULE | Freq: Every day | ORAL | Status: DC
  Administered 2018-06-14: 30 mg via ORAL
  Filled 2018-06-14: qty 1

## 2018-06-14 MED ORDER — PROPOFOL 500 MG/50ML IV EMUL
INTRAVENOUS | Status: DC | PRN
  Administered 2018-06-14: 100 ug/kg/min via INTRAVENOUS

## 2018-06-14 MED ORDER — PHENYLEPHRINE HCL 10 MG/ML IJ SOLN
INTRAMUSCULAR | Status: DC | PRN
  Administered 2018-06-14: 100 ug via INTRAVENOUS

## 2018-06-14 MED ORDER — BUPIVACAINE-EPINEPHRINE (PF) 0.25% -1:200000 IJ SOLN
INTRAMUSCULAR | Status: AC
  Filled 2018-06-14: qty 30

## 2018-06-14 MED ORDER — MIDAZOLAM HCL 2 MG/2ML IJ SOLN
INTRAMUSCULAR | Status: AC
  Filled 2018-06-14: qty 2

## 2018-06-14 SURGICAL SUPPLY — 48 items
ALCOHOL 70% 16 OZ (MISCELLANEOUS) ×3 IMPLANT
BANDAGE ACE 4X5 VEL STRL LF (GAUZE/BANDAGES/DRESSINGS) ×3 IMPLANT
BANDAGE ACE 6X5 VEL STRL LF (GAUZE/BANDAGES/DRESSINGS) ×3 IMPLANT
BANDAGE ELASTIC 4 VELCRO ST LF (GAUZE/BANDAGES/DRESSINGS) ×2 IMPLANT
BANDAGE ELASTIC 6 VELCRO ST LF (GAUZE/BANDAGES/DRESSINGS) ×2 IMPLANT
BNDG COHESIVE 6X5 TAN STRL LF (GAUZE/BANDAGES/DRESSINGS) ×3 IMPLANT
BNDG GAUZE ELAST 4 BULKY (GAUZE/BANDAGES/DRESSINGS) ×4 IMPLANT
COVER SURGICAL LIGHT HANDLE (MISCELLANEOUS) ×3 IMPLANT
DRAPE C-ARM 42X72 X-RAY (DRAPES) IMPLANT
DRAPE C-ARMOR (DRAPES) ×3 IMPLANT
DRAPE U-SHAPE 47X51 STRL (DRAPES) ×3 IMPLANT
ELECT REM PT RETURN 9FT ADLT (ELECTROSURGICAL) ×3
ELECTRODE REM PT RTRN 9FT ADLT (ELECTROSURGICAL) ×1 IMPLANT
GAUZE SPONGE 4X4 12PLY STRL (GAUZE/BANDAGES/DRESSINGS) ×3 IMPLANT
GAUZE XEROFORM 5X9 LF (GAUZE/BANDAGES/DRESSINGS) ×3 IMPLANT
GLOVE BIO SURGEON STRL SZ7.5 (GLOVE) ×3 IMPLANT
GLOVE BIOGEL PI IND STRL 8 (GLOVE) ×1 IMPLANT
GLOVE BIOGEL PI INDICATOR 8 (GLOVE) ×2
GOWN STRL REUS W/ TWL LRG LVL3 (GOWN DISPOSABLE) ×2 IMPLANT
GOWN STRL REUS W/ TWL XL LVL3 (GOWN DISPOSABLE) ×1 IMPLANT
GOWN STRL REUS W/TWL LRG LVL3 (GOWN DISPOSABLE) ×6
GOWN STRL REUS W/TWL XL LVL3 (GOWN DISPOSABLE) ×3
HANDPIECE INTERPULSE COAX TIP (DISPOSABLE)
KIT BASIN OR (CUSTOM PROCEDURE TRAY) ×3 IMPLANT
KIT TURNOVER KIT B (KITS) ×3 IMPLANT
NEEDLE 22X1 1/2 (OR ONLY) (NEEDLE) IMPLANT
NS IRRIG 1000ML POUR BTL (IV SOLUTION) ×3 IMPLANT
PACK ORTHO EXTREMITY (CUSTOM PROCEDURE TRAY) ×3 IMPLANT
PAD ABD 8X10 STRL (GAUZE/BANDAGES/DRESSINGS) ×2 IMPLANT
PAD ARMBOARD 7.5X6 YLW CONV (MISCELLANEOUS) ×6 IMPLANT
PAD CAST 4YDX4 CTTN HI CHSV (CAST SUPPLIES) IMPLANT
PADDING CAST COTTON 4X4 STRL (CAST SUPPLIES) ×3
PADDING CAST COTTON 6X4 STRL (CAST SUPPLIES) ×5 IMPLANT
PIN STEINMANN CT 4.5X150 (PIN) ×2 IMPLANT
SET HNDPC FAN SPRY TIP SCT (DISPOSABLE) IMPLANT
SPLINT PLASTER CAST XFAST 5X30 (CAST SUPPLIES) IMPLANT
SPLINT PLASTER XFAST SET 5X30 (CAST SUPPLIES) ×2
SPONGE LAP 18X18 X RAY DECT (DISPOSABLE) ×3 IMPLANT
STAPLER VISISTAT 35W (STAPLE) IMPLANT
STOCKINETTE IMPERVIOUS LG (DRAPES) ×3 IMPLANT
SYR CONTROL 10ML LL (SYRINGE) IMPLANT
TOWEL OR 17X24 6PK STRL BLUE (TOWEL DISPOSABLE) ×6 IMPLANT
TOWEL OR 17X26 10 PK STRL BLUE (TOWEL DISPOSABLE) ×6 IMPLANT
TUBE CONNECTING 12'X1/4 (SUCTIONS) ×1
TUBE CONNECTING 12X1/4 (SUCTIONS) ×2 IMPLANT
UNDERPAD 30X30 (UNDERPADS AND DIAPERS) ×3 IMPLANT
WATER STERILE IRR 1000ML POUR (IV SOLUTION) ×6 IMPLANT
YANKAUER SUCT BULB TIP NO VENT (SUCTIONS) ×3 IMPLANT

## 2018-06-14 NOTE — Plan of Care (Signed)
  Problem: Activity: Goal: Risk for activity intolerance will decrease Outcome: Progressing   Problem: Safety: Goal: Ability to remain free from injury will improve Outcome: Progressing   

## 2018-06-14 NOTE — H&P (Signed)
ORTHOPAEDIC H and P  REQUESTING PHYSICIAN: Yolonda Kida, MD  PCP:  Patient, No Pcp Per  Chief Complaint: Right ankle external fixator  HPI: [name] is a 47 y.o. female who complains of right ankle retained external fixator.  She presents today for removal due to a staged fixation of her right ankle trimalleolar fracture.  No new complaints.  She has had some scant SS drainage from the lateral heel pin site, otherwise no new complatins.  Past Medical History:  Diagnosis Date  . Chiari malformation type II (HCC)   . Family history of adverse reaction to anesthesia    grandfather died from anesthesia; grandfather had malignant hyperthermia  . GERD (gastroesophageal reflux disease)   . Hydrocephalus   . Malignant hyperthermia   . Osteoarthritis   . PONV (postoperative nausea and vomiting)   . Spina bifida [facility])    Past Surgical History:  Procedure Laterality Date  . ANKLE ARTHROSCOPY Left    related to Tendon  . BACK SURGERY  1971   Spinal Bifida  . EXTERNAL FIXATION LEG Right 05/18/2018   Procedure: EXTERNAL FIXATION LEG;  Surgeon: Yolonda Kida, MD;  Location: [facility] OR;  Service: Orthopedics;  Laterality: Right;  . HARDWARE REMOVAL Right 05/18/2018   Procedure: HARDWARE REMOVAL;  Surgeon: Yolonda Kida, MD;  Location: [facility] OR;  Service: Orthopedics;  Laterality: Right;  . KNEE ARTHROSCOPY Left 1983  . ORIF ANKLE FRACTURE Right 04/23/2018   Procedure: OPEN REDUCTION INTERNAL FIXATION (ORIF) TRIMALLEOLAR ANKLE FRACTURE;  Surgeon: Yolonda Kida, MD;  Location: [facility] ORS;  Service: Orthopedics;  Laterality: Right;  120 mins  . ORIF ANKLE FRACTURE Right 05/18/2018   Procedure: Right ankle open reduction internal fixation revision;  Surgeon: Yolonda Kida, MD;  Location: [facility] OR;  Service: Orthopedics;  Laterality: Right;  120 mins  . SHUNT EXTERNALIZATION     Head x 2   Social History   Socioeconomic History  . Marital status: Married   Spouse name: Not on file  . Number of children: Not on file  . Years of education: Not on file  . Highest education level: Not on file  Occupational History  . Not on file  Social Needs  . Financial resource strain: Not on file  . Food insecurity:    Worry: Not on file    Inability: Not on file  . Transportation needs:    Medical: Not on file    Non-medical: Not on file  Tobacco Use  . Smoking status: Never Smoker  . Smokeless tobacco: Never Used  Substance and Sexual Activity  . Alcohol use: Never    Frequency: Never  . Drug use: Never  . Sexual activity: Not on file  Lifestyle  . Physical activity:    Days per week: Not on file    Minutes per session: Not on file  . Stress: Not on file  Relationships  . Social connections:    Talks on phone: Not on file    Gets together: Not on file    Attends religious service: Not on file    Active member of club or organization: Not on file    Attends meetings of clubs or organizations: Not on file    Relationship status: Not on file  Other Topics Concern  . Not on file  Social History Narrative  . Not on file   Family History  Problem Relation Age of Onset  . Non-Hodgkin's lymphoma Mother   . Hypertension  Mother   . Hypertension Father    Allergies  Allergen Reactions  . Anesthetics, Amide Other (See Comments)    Pt has Malignant Hyperthermia which is an allergy to general anesthesia  . Gabapentin Other (See Comments)    Depression.  Marland Kitchen. Percocet [Oxycodone-Acetaminophen] Itching  . Latex Rash  . Other Rash and Other (See Comments)    Latex Adhesive tape: prefers to use paper tape   Prior to Admission medications   Medication Sig Start Date End Date Taking? Authorizing Provider  cyclobenzaprine (FLEXERIL) 10 MG tablet Take 10 mg by mouth 3 (three) times daily as needed for muscle spasms.   Yes [provider]  docusate sodium (COLACE) 100 MG capsule Take 1 capsule (100 mg total) by mouth 2 (two) times  daily. Patient taking differently: Take 100 mg by mouth 2 (two) times daily as needed for moderate constipation.  05/12/18  Yes Love, Evlyn KannerPamela S, PA-C  DULoxetine (CYMBALTA) 30 MG capsule Take 1 capsule (30 mg total) by mouth at bedtime. 05/12/18  Yes Love, Evlyn KannerPamela S, PA-C  HYDROcodone-acetaminophen (NORCO) 7.5-325 MG tablet Take 1 tablet by mouth every 6 (six) hours as needed (breakthrough pain). 05/12/18  Yes Love, Evlyn KannerPamela S, PA-C  ibuprofen (ADVIL,MOTRIN) 200 MG tablet Take 400-600 mg by mouth every 8 (eight) hours as needed for moderate pain.    Yes [provider]  methocarbamol (ROBAXIN) 500 MG tablet Take 1 tablet (500 mg total) by mouth every 6 (six) hours as needed for muscle spasms. 05/12/18  Yes Love, Evlyn KannerPamela S, PA-C  acetaminophen (TYLENOL) 500 MG tablet Take 1,000 mg by mouth every 6 (six) hours as needed for moderate pain or headache.    [provider]  enoxaparin (LOVENOX) 40 MG/0.4ML injection Inject 0.4 mLs (40 mg total) into the skin daily. 05/12/18 06/23/18  Love, Evlyn KannerPamela S, PA-C  oxyCODONE (ROXICODONE) 5 MG immediate release tablet Take 1-2 tablets (5-10 mg total) by mouth every 6 (six) hours as needed for severe pain. 05/20/18 05/20/19  Yolonda Kidaogers, Madalen Gavin Patrick, MD   No results found.  Positive ROS: All other systems have been reviewed and were otherwise negative with the exception of those mentioned in the HPI and as above.  Physical Exam: General: Alert, no acute distress Cardiovascular: No pedal edema Respiratory: No cyanosis, no use of accessory musculature GI: No organomegaly, abdomen is soft and non-tender Skin: No lesions in the area of chief complaint Neurologic: Sensation intact distally Psychiatric: Patient is competent for consent with normal mood and affect Lymphatic: No axillary or cervical lymphadenopathy    Assessment: Retained Right ankle external fixator  Plan: - plan for OR today for ex-fix removal and placement of steinmann pin.  We again  reviewed the risks and benefits of this procedure.  All questions were answered and solicited to her satisfaction. - we will plan on splinting her post operatively and dc home from PACU with NWB to her right ankle, as before procedure.    Yolonda KidaJason Patrick Bralynn Velador, MD Cell [phone]    06/14/2018 1:52 PM

## 2018-06-14 NOTE — Transfer of Care (Signed)
Immediate Anesthesia Transfer of Care Note  Patient: [name]  Procedure(s) Performed: Right ankle external fixator removal with steinman pin placement (Right Ankle)  Patient Location: PACU  Anesthesia Type:General  Level of Consciousness: awake and alert   Airway & Oxygen Therapy: Patient Spontanous Breathing  Post-op Assessment: Report given to RN and Post -op Vital signs reviewed and stable  Post vital signs: Reviewed and stable  Last Vitals:  Vitals Value Taken Time  BP 103/57 06/14/2018  3:46 PM  Temp    Pulse 93 06/14/2018  3:49 PM  Resp 14 06/14/2018  3:49 PM  SpO2 94 % 06/14/2018  3:49 PM  Vitals shown include unvalidated device data.  Last Pain:  Vitals:   06/14/18 1252  TempSrc:   PainSc: 3       Patients Stated Pain Goal: 3 (06/14/18 1252)  Complications: No apparent anesthesia complications

## 2018-06-14 NOTE — Discharge Instructions (Signed)
DC instructions:  -  No weight bearing to the right ankle/foot - keep right leg elevated with "toes above nose." - keep splint clean and dry at all times - return to see Dr. Aundria Rudogers in 3 weeks - take 325 mg aspirin twice daily for 6 weeks for prevention of blood clots

## 2018-06-14 NOTE — Anesthesia Procedure Notes (Addendum)
Anesthesia Regional Block: Popliteal block   Pre-Anesthetic Checklist: ,, timeout performed, Correct Patient, Correct Site, Correct Laterality, Correct Procedure, Correct Position, site marked, Risks and benefits discussed,  Surgical consent,  Pre-op evaluation,  At surgeon's request and post-op pain management  Laterality: Right  Prep: chloraprep       Needles:  Injection technique: Single-shot  Needle Type: Stimiplex     Needle Length: 10cm  Needle Gauge: 21     Additional Needles:   Procedures:,,,, ultrasound used (permanent image in chart),,,,  Motor weakness within 5 minutes.   Nerve Stimulator or Paresthesia:  Response: 0.5 mA,   Additional Responses:   Narrative:  Start time: 06/14/2018 1:30 PM End time: 06/14/2018 1:40 PM Injection made incrementally with aspirations every 5 mL.  Performed by: Personally  Anesthesiologist: Lewie LoronGermeroth, Patton Rabinovich, MD  Additional Notes: Nerve located and needle positioned with direct ultrasound guidance. Good perineural spread. Patient tolerated well.

## 2018-06-14 NOTE — Anesthesia Procedure Notes (Signed)
Procedure Name: LMA Insertion Date/Time: 06/14/2018 2:47 PM Performed by: Gwenyth AllegraAdami, Chanel Mcadams, CRNA Pre-anesthesia Checklist: Patient identified, Emergency Drugs available, Suction available, Patient being monitored and Timeout performed Patient Re-evaluated:Patient Re-evaluated prior to induction Oxygen Delivery Method: Circle system utilized Preoxygenation: Pre-oxygenation with 100% oxygen Induction Type: IV induction Ventilation: Mask ventilation without difficulty LMA: LMA inserted LMA Size: 4.0 Placement Confirmation: positive ETCO2 and breath sounds checked- equal and bilateral Tube secured with: Tape Dental Injury: Teeth and Oropharynx as per pre-operative assessment

## 2018-06-14 NOTE — Op Note (Signed)
Date of Surgery: 06/14/2018  INDICATIONS: [name] is a 47 y.o.-year-old female with a right Unstable trimalleolar ankle fracture.  She underwent internal fixation about 2 months ago.  During the course of her inpatient rehabilitation stay there was some manipulation with splint changes that resulted in loss of fixation of the right ankle.  She was placed in an external fixator about 4 weeks ago to stabilize the situation allow for some healing.  She presents today for fixator removal and placement of a Steinmann pin;  The patient did consent to the procedure after discussion of the risks and benefits.  PREOPERATIVE DIAGNOSIS:  1. Retained external fixator right ankle 2. Right ankle trimalleolar fracture  POSTOPERATIVE DIAGNOSIS: Same.  PROCEDURE:  1.  Removal of multiplane external fixator right ankle 2.  Percutaneous fixation of right ankle trimalleolar fracture  SURGEON: Maryan RuedJason P Rogers, M.D.  ASSIST: Hart CarwinJustin Queen, RNFA.  ANESTHESIA:  general  IV FLUIDS AND URINE: See anesthesia.  ESTIMATED BLOOD LOSS: 5 mL.  IMPLANTS:  1 4.7 mm smooth Steinmann pin  DRAINS: None  COMPLICATIONS: None.  DESCRIPTION OF PROCEDURE: The patient was brought to the operating room and placed Supine on the operating table.  The patient had been signed prior to the procedure and this was documented. The patient had the anesthesia placed by the anesthesiologist.  A time-out was performed to confirm that this was the correct patient, site, side and location. The patient did receive antibiotics prior to the incision and was re-dosed during the procedure as needed at indicated intervals.  A tourniquet Was not placed.  The patient had the operative extremity prepped and draped in the standard surgical fashion.      The procedure began by removing the external fixator.  Of note all 3 Schanz pins were noted to have become loose in the previous 4 weeks while she was in this fixator.  The pins were removed  atraumatically.  We then curetted each pin site to remove any loose bone debris and to elicit bleeding bone response.  Those pin sites were then irrigated copiously with normal saline.  Next we assessed the ankle on radiographs.  She was noted to have still no real healing noted along the fibula or medial malleolus.  Ankle was felt to be somewhat unstable still.  Therefore we moved to reduce the mortise and placed a smooth Steinmann pin from the plantar aspect of the calcaneus through the talus and into the tibia.  This was done utilizing AP and lateral radiography intraoperatively.  The pin was cut approximately 1/2 inch from the skin and well padded.  Next we placed a well-padded short leg splint to the right ankle in neutral dorsiflexion.  There were no immediate intraoperative complications.  All counts were correct 2.  The patient was awakened from general anesthesia in stable condition and transferred to PACU.  POSTOPERATIVE PLAN:  Renee MccreedyBarbara will be nonweightbearing to the right lower extremity for approximately 6-8 more weeks.  We will plan to remove the pin in the office in a proximally 4 weeks.  She will be placed on twice daily aspirin for DVT prophylaxis.  She will be admitted into observation for pain control.

## 2018-06-14 NOTE — Brief Op Note (Signed)
06/14/2018  3:47 PM  PATIENT:  Shirline FreesBarbara L Mancera  47 y.o. female  PRE-OPERATIVE DIAGNOSIS:  Right ankle external fixator  POST-OPERATIVE DIAGNOSIS:  * No post-op diagnosis entered *  PROCEDURE:  Procedure(s) with comments: Right ankle external fixator removal with possible stienman pin placemnet (Right) - 60 mins  SURGEON:  Surgeon(s) and Role:    * Aundria Rudogers, Noah DelaineJason Patrick, MD - Primary  PHYSICIAN ASSISTANT:   ASSISTANTS: Hart CarwinJustin Queen, RNFA   ANESTHESIA:   general  EBL:  5 cc   BLOOD ADMINISTERED:none  DRAINS: none   LOCAL MEDICATIONS USED:  MARCAINE     SPECIMEN:  No Specimen  DISPOSITION OF SPECIMEN:  N/A  COUNTS:  YES  TOURNIQUET:  * Missing tourniquet times found for documented tourniquets in log: [id number] *  DICTATION: .Note written in EPIC  PLAN OF CARE: Admit for overnight observation  PATIENT DISPOSITION:  PACU - hemodynamically stable.   Delay start of Pharmacological VTE agent (>24hrs) due to surgical blood loss or risk of bleeding: not applicable

## 2018-06-14 NOTE — Anesthesia Preprocedure Evaluation (Addendum)
Anesthesia Evaluation  Patient identified by MRN, date of birth, ID band Patient awake    Reviewed: Allergy & Precautions, H&P , NPO status , Patient's Chart, lab work & pertinent test results  History of Anesthesia Complications (+) PONV, MALIGNANT HYPERTHERMIA, Family history of anesthesia reaction and history of anesthetic complications  Airway Mallampati: II   Neck ROM: full    Dental   Pulmonary neg pulmonary ROS,    breath sounds clear to auscultation       Cardiovascular negative cardio ROS   Rhythm:regular Rate:Normal     Neuro/Psych Spina bifida. hydrocephalus    GI/Hepatic GERD  ,  Endo/Other  Morbid obesity  Renal/GU      Musculoskeletal  (+) Arthritis ,   Abdominal   Peds  Hematology  (+) anemia ,   Anesthesia Other Findings Grandfather had malignant hyperthermia  Reproductive/Obstetrics                             Anesthesia Physical  Anesthesia Plan  ASA: III  Anesthesia Plan: General and Regional   Post-op Pain Management:  Regional for Post-op pain   Induction: Intravenous  PONV Risk Score and Plan: 4 or greater and Ondansetron, Propofol infusion, Dexamethasone, Treatment may vary due to age or medical condition, Midazolam and TIVA  Airway Management Planned: LMA  Additional Equipment:   Intra-op Plan:   Post-operative Plan: Extubation in OR  Informed Consent: I have reviewed the patients History and Physical, chart, labs and discussed the procedure including the risks, benefits and alternatives for the proposed anesthesia with the patient or authorized representative who has indicated his/her understanding and acceptance.   Dental advisory given  Plan Discussed with: CRNA  Anesthesia Plan Comments:         Anesthesia Quick Evaluation

## 2018-06-15 ENCOUNTER — Encounter (HOSPITAL_COMMUNITY): Payer: Self-pay | Admitting: Orthopedic Surgery

## 2018-06-15 DIAGNOSIS — S82851D Displaced trimalleolar fracture of right lower leg, subsequent encounter for closed fracture with routine healing: Secondary | ICD-10-CM | POA: Diagnosis not present

## 2018-06-15 NOTE — Progress Notes (Signed)
   Subjective:  Patient reports pain as mild.  Block still working.  No issues with n/v or SOB/CP.  Objective:   VITALS:   Vitals:   06/14/18 1822 06/14/18 2100 06/15/18 0041 06/15/18 0453  BP:  130/73 130/74 114/69  Pulse:  78 80 72  Resp:  15 16 15   Temp:  98.7 F (37.1 C) 98.6 F (37 C) 98 F (36.7 C)  TempSrc:  Oral Oral Oral  SpO2:  95% 95% 96%  Weight: 126.6 kg (279 lb)     Height: 5' (1.524 m)       Incision: no drainage Compartment soft Splinted and c/d/i No motor or sensory just yet 2/2 block  Lab Results  Component Value Date   WBC 8.5 06/14/2018   HGB 13.4 06/14/2018   HCT 42.6 06/14/2018   MCV 92.0 06/14/2018   PLT 351 06/14/2018   BMET    Component Value Date/Time   NA 138 06/14/2018 1218   K 3.9 06/14/2018 1218   CL 103 06/14/2018 1218   CO2 24 06/14/2018 1218   GLUCOSE 91 06/14/2018 1218   BUN 12 06/14/2018 1218   CREATININE 0.95 06/14/2018 1218   CALCIUM 9.4 06/14/2018 1218   GFRNONAA >60 06/14/2018 1218   GFRAA >60 06/14/2018 1218     Assessment/Plan: 1 Day Post-Op   Active Problems:   Closed right ankle fracture   Advance diet Up with therapy - plan on dc home later today.  Pt has been undergoing NWB to leg for 2 months almost and will not need a new PT eval. - NWB to RLE - will follow up with Dr. Aundria Rudogers in 2-3 weeks - bid asa for DVT ppx   [name] 06/15/2018, 9:19 AM   Renee RuedJason P Rogers, MD [phone]

## 2018-06-15 NOTE — Progress Notes (Signed)
Written and verbal discharge instructions given to the patient.  Verbalizes understanding instructions and follow up plan.

## 2018-06-18 NOTE — Anesthesia Postprocedure Evaluation (Signed)
Anesthesia Post Note  Patient: [name]  Procedure(s) Performed: Right ankle external fixator removal with steinman pin placement (Right Ankle)     Patient location during evaluation: PACU Anesthesia Type: Regional and General Level of consciousness: awake and alert Pain management: pain level controlled Vital Signs Assessment: post-procedure vital signs reviewed and stable Respiratory status: spontaneous breathing, nonlabored ventilation, respiratory function stable and patient connected to nasal cannula oxygen Cardiovascular status: blood pressure returned to baseline and stable Postop Assessment: no apparent nausea or vomiting Anesthetic complications: no    Last Vitals:  Vitals:   06/15/18 0453 06/15/18 1428  BP: 114/69 (!) 115/55  Pulse: 72 79  Resp: 15 16  Temp: 36.7 C 37 C  SpO2: 96% 96%    Last Pain:  Vitals:   06/15/18 1428  TempSrc: Oral  PainSc:                  [name]

## 2018-06-24 NOTE — Discharge Summary (Signed)
Patient ID: [name] MRN: [id number] DOB/AGE: 06/16/1971 47 y.o.  Admit date: 06/14/2018 Discharge date: 06/15/2018  Primary Diagnosis: Right ankle external fixator  Admission Diagnoses:  Past Medical History:  Diagnosis Date  . 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 Family history of adverse reaction to anesthesia    grandfather died from anesthesia; grandfather had malignant hyperthermia  . GERD (gastroesophageal reflux disease)   . Hydrocephalus   . Malignant hyperthermia   . Osteoarthritis   . PONV (postoperative nausea and vomiting)   . Spina bifida [facility])    Discharge Diagnoses:   Active Problems:   Closed right ankle fracture  Estimated body mass index is 54.49 kg/m as calculated from the following:   Height as of this encounter: 5' (1.524 m).   Weight as of this encounter: 126.6 kg (279 lb).  Procedure:  Procedure(s) (LRB): Right ankle external fixator removal with steinman pin placement (Right)   Consults: None  HPI: [name] presented to the hospital for removal of her right ankle external fixator. Laboratory Data: Admission on 06/14/2018, Discharged on 06/15/2018  Component Date Value Ref Range Status  . WBC 06/14/2018 8.5  4.0 - 10.5 K/uL Final  . RBC 06/14/2018 4.63  3.87 - 5.11 MIL/uL Final  . Hemoglobin 06/14/2018 13.4  12.0 - 15.0 g/dL Final  . HCT 06/14/2018 42.6  36.0 - 46.0 % Final  . MCV 06/14/2018 92.0  78.0 - 100.0 fL Final  . MCH 06/14/2018 28.9  26.0 - 34.0 pg Final  . MCHC 06/14/2018 31.5  30.0 - 36.0 g/dL Final  . RDW 06/14/2018 13.0  11.5 - 15.5 % Final  . Platelets 06/14/2018 351  150 - 400 K/uL Final   Performed at [facility], [place] [address], [place], [place] [address]  . Sodium 06/14/2018 138  135 - 145 mmol/L Final  . Potassium 06/14/2018 3.9  3.5 - 5.1 mmol/L Final  . Chloride 06/14/2018 103  98 - 111 mmol/L Final   Please note change in reference range.  . CO2 06/14/2018 24  22 - 32 mmol/L Final  . Glucose, Bld  06/14/2018 91  70 - 99 mg/dL Final   Please note change in reference range.  . BUN 06/14/2018 12  6 - 20 mg/dL Final   Please note change in reference range.  . Creatinine, Ser 06/14/2018 0.95  0.44 - 1.00 mg/dL Final  . Calcium 06/14/2018 9.4  8.9 - 10.3 mg/dL Final  . GFR calc non Af Amer 06/14/2018 >60  >60 mL/min Final  . GFR calc Af Amer 06/14/2018 >60  >60 mL/min Final   Comment: (NOTE) The eGFR has been calculated using the CKD EPI equation. This calculation has not been validated in all clinical situations. eGFR's persistently <60 mL/min signify possible Chronic Kidney Disease.   Georgiann Hahn gap 06/14/2018 11  5 - 15 Final   Performed at [place] [address] [place], [place] [address]  . Prothrombin Time 06/14/2018 12.6  11.4 - 15.2 seconds Final  . INR 06/14/2018 0.95   Final   Performed at [place] [address] [place], [place] [address]  . Preg, Serum 06/14/2018 NEGATIVE  NEGATIVE Final   Comment:        THE SENSITIVITY OF THIS METHODOLOGY IS >10 mIU/mL. Performed at [facility], [place] [address] [place], [place] [address]   Admission on 05/18/2018, Discharged on 05/20/2018  Component Date Value Ref Range Status  .  Preg, Serum 05/18/2018 NEGATIVE  NEGATIVE Final   Comment:        THE SENSITIVITY OF THIS METHODOLOGY IS >10 mIU/mL. Performed at [facility], [place] [address], [place], [place] [address]   Admission on 04/29/2018, Discharged on 05/12/2018  Component Date Value Ref Range Status  . WBC 04/29/2018 7.0  4.0 - 10.5 K/uL Final  . RBC 04/29/2018 4.29  3.87 - 5.11 MIL/uL Final  . Hemoglobin 04/29/2018 12.6  12.0 - 15.0 g/dL Final  . HCT 04/29/2018 38.9  36.0 - 46.0 % Final  . MCV 04/29/2018 90.7  78.0 - 100.0 fL Final  . MCH 04/29/2018 29.4  26.0 - 34.0 pg Final  . MCHC 04/29/2018 32.4  30.0 - 36.0 g/dL Final  . RDW 04/29/2018 13.3  11.5 - 15.5 % Final  . Platelets 04/29/2018 416* 150 - 400 K/uL Final   Performed at [facility], [place] [address] [place], [place] [address]  . Creatinine, Ser 04/29/2018 0.79  0.44 - 1.00 mg/dL Final  . GFR calc non Af Amer 04/29/2018 >60  >60 mL/min Final  . GFR calc Af Amer 04/29/2018 >60  >60 mL/min Final   Comment: (NOTE) The eGFR has been calculated using the CKD EPI equation. This calculation has not been validated in all clinical situations. eGFR's persistently <60 mL/min signify possible Chronic Kidney Disease. Performed at [facility], [place] [address] [place], [place] [address]   . WBC 04/30/2018 6.8  4.0 - 10.5 K/uL Final  . RBC 04/30/2018 4.04  3.87 - 5.11 MIL/uL Final  . Hemoglobin 04/30/2018 11.8* 12.0 - 15.0 g/dL Final  . HCT 04/30/2018 37.3  36.0 - 46.0 % Final  . MCV 04/30/2018 92.3  78.0 - 100.0 fL Final  . MCH 04/30/2018 29.2  26.0 - 34.0 pg Final  . MCHC 04/30/2018 31.6  30.0 - 36.0 g/dL Final  . RDW 04/30/2018 13.3  11.5 - 15.5 % Final  . Platelets 04/30/2018 363  150 - 400 K/uL Final  . Neutrophils Relative % 04/30/2018 51  % Final  . Neutro Abs 04/30/2018 3.5  1.7 - 7.7 K/uL Final  . Lymphocytes Relative 04/30/2018 34  % Final  . Lymphs Abs 04/30/2018 2.3  0.7 - 4.0 K/uL Final  . Monocytes Relative 04/30/2018 10  % Final  . Monocytes Absolute 04/30/2018 0.7  0.1 - 1.0 K/uL Final  . Eosinophils Relative 04/30/2018 4  % Final  . Eosinophils Absolute 04/30/2018 0.3  0.0 - 0.7 K/uL Final  . Basophils Relative 04/30/2018 1  % Final  . Basophils Absolute 04/30/2018 0.0  0.0 - 0.1 K/uL Final  . Immature Granulocytes 04/30/2018 0  % Final  . Abs Immature Granulocytes 04/30/2018 0.0  0.0 - 0.1 K/uL Final   Performed at [facility], [place] [address], [place], [place] [address]  . Sodium 04/30/2018 135  135 - 145 mmol/L Final  . Potassium 04/30/2018 4.2  3.5 - 5.1 mmol/L Final  . Chloride 04/30/2018 100* 101 - 111 mmol/L Final  . CO2 04/30/2018 24  22 - 32 mmol/L Final  . Glucose, Bld 04/30/2018 88  65 - 99 mg/dL Final  . BUN 04/30/2018  13  6 - 20 mg/dL Final  . Creatinine, Ser 04/30/2018 0.72  0.44 - 1.00 mg/dL Final  . Calcium 04/30/2018 8.7* 8.9 - 10.3 mg/dL Final  . Total Protein 04/30/2018 6.1* 6.5 - 8.1 g/dL Final  . Albumin 04/30/2018 2.9* 3.5 - 5.0  g/dL Final  . AST 04/30/2018 18  15 - 41 U/L Final  . ALT 04/30/2018 21  14 - 54 U/L Final  . Alkaline Phosphatase 04/30/2018 94  38 - 126 U/L Final  . Total Bilirubin 04/30/2018 0.6  0.3 - 1.2 mg/dL Final  . GFR calc non Af Amer 04/30/2018 >60  >60 mL/min Final  . GFR calc Af Amer 04/30/2018 >60  >60 mL/min Final   Comment: (NOTE) The eGFR has been calculated using the CKD EPI equation. This calculation has not been validated in all clinical situations. eGFR's persistently <60 mL/min signify possible Chronic Kidney Disease.   . Anion gap 04/30/2018 11  5 - 15 Final   Performed at [facility], [place] [address] [place], [place] [address]  . Sodium 05/07/2018 141  135 - 145 mmol/L Final  . Potassium 05/07/2018 3.6  3.5 - 5.1 mmol/L Final  . Chloride 05/07/2018 106  101 - 111 mmol/L Final  . CO2 05/07/2018 27  22 - 32 mmol/L Final  . Glucose, Bld 05/07/2018 111* 65 - 99 mg/dL Final  . BUN 05/07/2018 15  6 - 20 mg/dL Final  . Creatinine, Ser 05/07/2018 0.88  0.44 - 1.00 mg/dL Final  . Calcium 05/07/2018 9.0  8.9 - 10.3 mg/dL Final  . GFR calc non Af Amer 05/07/2018 >60  >60 mL/min Final  . GFR calc Af Amer 05/07/2018 >60  >60 mL/min Final   Comment: (NOTE) The eGFR has been calculated using the CKD EPI equation. This calculation has not been validated in all clinical situations. eGFR's persistently <60 mL/min signify possible Chronic Kidney Disease.   Georgiann Hahn gap 05/07/2018 8  5 - 15 Final   Performed at [facility], [place] [address] [place], [place] [address]  . WBC 05/07/2018 5.3  4.0 - 10.5 K/uL Final  . RBC 05/07/2018 4.17  3.87 - 5.11 MIL/uL Final  . Hemoglobin 05/07/2018 12.2  12.0 - 15.0 g/dL Final  . HCT 05/07/2018 38.1  36.0 - 46.0 % Final    . MCV 05/07/2018 91.4  78.0 - 100.0 fL Final  . MCH 05/07/2018 29.3  26.0 - 34.0 pg Final  . MCHC 05/07/2018 32.0  30.0 - 36.0 g/dL Final  . RDW 05/07/2018 13.2  11.5 - 15.5 % Final  . Platelets 05/07/2018 386  150 - 400 K/uL Final   Performed at [facility], [place] [address] [place], [place] [address]  Admission on 04/23/2018, Discharged on 04/29/2018  Component Date Value Ref Range Status  . WBC 04/21/2018 8.8  4.0 - 10.5 K/uL Final  . RBC 04/21/2018 4.76  3.87 - 5.11 MIL/uL Final  . Hemoglobin 04/21/2018 14.3  12.0 - 15.0 g/dL Final  . HCT 04/21/2018 43.5  36.0 - 46.0 % Final  . MCV 04/21/2018 91.4  78.0 - 100.0 fL Final  . MCH 04/21/2018 30.0  26.0 - 34.0 pg Final  . MCHC 04/21/2018 32.9  30.0 - 36.0 g/dL Final  . RDW 04/21/2018 13.6  11.5 - 15.5 % Final  . Platelets 04/21/2018 351  150 - 400 K/uL Final   Performed at [facility], [place] [address], [place], [place] [address]  . Sodium 04/21/2018 139  135 - 145 mmol/L Final  . Potassium 04/21/2018 4.8  3.5 - 5.1 mmol/L Final  . Chloride 04/21/2018 104  101 - 111 mmol/L Final  . CO2 04/21/2018 23  22 - 32 mmol/L Final  . Glucose, Bld 04/21/2018  99  65 - 99 mg/dL Final  . BUN 04/21/2018 22* 6 - 20 mg/dL Final  . Creatinine, Ser 04/21/2018 0.88  0.44 - 1.00 mg/dL Final  . Calcium 04/21/2018 9.6  8.9 - 10.3 mg/dL Final  . GFR calc non Af Amer 04/21/2018 >60  >60 mL/min Final  . GFR calc Af Amer 04/21/2018 >60  >60 mL/min Final   Comment: (NOTE) The eGFR has been calculated using the CKD EPI equation. This calculation has not been validated in all clinical situations. eGFR's persistently <60 mL/min signify possible Chronic Kidney Disease.   Georgiann Hahn gap 04/21/2018 12  5 - 15 Final   Performed at [facility], [place] [address], [place], [place] [address]  . HIV Screen 4th Generation wRfx 04/23/2018 Non Reactive  Non Reactive Final   Comment: (NOTE) Performed At: [facility] [place], [place] [id number] Rush Farmer MD [id number] Performed at [facility], [place] [address], [place], [place] [address]   . WBC 04/23/2018 11.4* 4.0 - 10.5 K/uL Final  . RBC 04/23/2018 4.50  3.87 - 5.11 MIL/uL Final  . Hemoglobin 04/23/2018 13.7  12.0 - 15.0 g/dL Final  . HCT 04/23/2018 41.4  36.0 - 46.0 % Final  . MCV 04/23/2018 92.0  78.0 - 100.0 fL Final  . MCH 04/23/2018 30.4  26.0 - 34.0 pg Final  . MCHC 04/23/2018 33.1  30.0 - 36.0 g/dL Final  . RDW 04/23/2018 13.5  11.5 - 15.5 % Final  . Platelets 04/23/2018 328  150 - 400 K/uL Final   Performed at [facility], [place] [address] [place], [place] [address]  . Creatinine, Ser 04/23/2018 0.85  0.44 - 1.00 mg/dL Final  . GFR calc non Af Amer 04/23/2018 >60  >60 mL/min Final  . GFR calc Af Amer 04/23/2018 >60  >60 mL/min Final   Comment: (NOTE) The eGFR has been calculated using the CKD EPI equation. This calculation has not been validated in all clinical situations. eGFR's persistently <60 mL/min signify possible Chronic Kidney Disease. Performed at [facility], [place] [address], [place], [place] [address]   . Triglycerides 04/29/2018 92  <150 mg/dL Final   Performed at [facility], [place] [address] [place], [place] [address]  Hospital Outpatient Visit on 04/21/2018  Component Date Value Ref Range Status  . Preg, Serum 04/21/2018 NEGATIVE  NEGATIVE Final   Comment:        THE SENSITIVITY OF THIS METHODOLOGY IS >10 mIU/mL. Performed at [facility], [place] [address] [place],  [address]      X-Rays:Dg Ankle 2 Views Right  Result Date: 06/14/2018 CLINICAL DATA:  External fixator removal and Steinmann pin placement. EXAM: DG C-ARM 61-120 MIN; RIGHT ANKLE - 2 VIEW COMPARISON:  Intraoperative right ankle x-rays dated May 18, 2018. FLUOROSCOPY TIME:  23 seconds. C-arm fluoroscopic images were obtained  intraoperatively and submitted for post operative interpretation. FINDINGS: AP and lateral fluoroscopic intraoperative images demonstrate interval placement of a Steinmann pin through the calcaneus, talus, and distal tibia. Unchanged lateral plate and screw fixation of the distal fibula with syndesmotic endo buttons. Persistent widening of the medial clear space with valgus angulation of the talus. IMPRESSION: 1. Interval placement of a Steinmann pin through the hindfoot and distal tibia. 2. Persistent widening of the medial clear space with valgus angulation of the talus. Electronically Signed   By: Titus Dubin M.D.   On: 06/14/2018 15:59  Dg C-arm 1-60 Min  Result Date: 06/14/2018 CLINICAL DATA:  External fixator removal and Steinmann pin placement. EXAM: DG C-ARM 61-120 MIN; RIGHT ANKLE - 2 VIEW COMPARISON:  Intraoperative right ankle x-rays dated May 18, 2018. FLUOROSCOPY TIME:  23 seconds. C-arm fluoroscopic images were obtained intraoperatively and submitted for post operative interpretation. FINDINGS: AP and lateral fluoroscopic intraoperative images demonstrate interval placement of a Steinmann pin through the calcaneus, talus, and distal tibia. Unchanged lateral plate and screw fixation of the distal fibula with syndesmotic endo buttons. Persistent widening of the medial clear space with valgus angulation of the talus. IMPRESSION: 1. Interval placement of a Steinmann pin through the hindfoot and distal tibia. 2. Persistent widening of the medial clear space with valgus angulation of the talus. Electronically Signed   By: Titus Dubin M.D.   On: 06/14/2018 15:59    EKG:No orders found for this or any previous visit.   Hospital Course: MYKEL MOHL is a 47 y.o. who was admitted to Hospital. They were brought to the operating room on 06/14/2018 and underwent Procedure(s): Right ankle external fixator removal with steinman pin placement.  Patient tolerated the procedure well and was later  transferred to the recovery room and then to the orthopaedic floor for postoperative care.  They were given PO and IV analgesics for pain control following their surgery.  They were given 24 hours of postoperative antibiotics of  Anti-infectives (From admission, onward)   Start     Dose/Rate Route Frequency Ordered Stop   06/15/18 0600  ceFAZolin (ANCEF) 3 g in dextrose 5 % 50 mL IVPB     3 g 100 mL/hr over 30 Minutes Intravenous On call to O.R. 06/14/18 1208 06/14/18 2147     and started on DVT prophylaxis in the form of Aspirin. They started to get up OOB with therapy on day one.  She had no issues with her splint and was able to mobilize with therapy and maintain nonweightbearing status.  Incision was healing well.  Patient was seen in rounds and was ready to go home.   Diet: Regular diet Activity:NWB Follow-up:in 2 weeks Disposition - Home Discharged Condition: good   Discharge Instructions    Call MD / Call 911   Complete by:  As directed    If you experience chest pain or shortness of breath, CALL 911 and be transported to the hospital emergency room.  If you develope a fever above 101 F, pus (white drainage) or increased drainage or redness at the wound, or calf pain, call your surgeon's office.   Constipation Prevention   Complete by:  As directed    Drink plenty of fluids.  Prune juice may be helpful.  You may use a stool softener, such as Colace (over the counter) 100 mg twice a day.  Use MiraLax (over the counter) for constipation as needed.   Diet - low sodium heart healthy   Complete by:  As directed    Increase activity slowly as tolerated   Complete by:  As directed      Allergies as of 06/15/2018      Reactions   Anesthetics, Amide Other (See Comments)   Pt has Malignant Hyperthermia which is an allergy to general anesthesia   Gabapentin Other (See Comments)   Depression.   Percocet [oxycodone-acetaminophen] Itching   Latex Rash   Other Rash, Other (See Comments)     Latex Adhesive tape: prefers to use paper tape      Medication List  TAKE these medications   acetaminophen 500 MG tablet Commonly known as:  TYLENOL Take 1,000 mg by mouth every 6 (six) hours as needed for moderate pain or headache.   cyclobenzaprine 10 MG tablet Commonly known as:  FLEXERIL Take 10 mg by mouth 3 (three) times daily as needed for muscle spasms.   docusate sodium 100 MG capsule Commonly known as:  COLACE Take 1 capsule (100 mg total) by mouth 2 (two) times daily. What changed:    when to take this  reasons to take this   DULoxetine 30 MG capsule Commonly known as:  CYMBALTA Take 1 capsule (30 mg total) by mouth at bedtime.   enoxaparin 40 MG/0.4ML injection Commonly known as:  LOVENOX Inject 0.4 mLs (40 mg total) into the skin daily.   HYDROcodone-acetaminophen 7.5-325 MG tablet Commonly known as:  NORCO Take 1-2 tablets by mouth every 4 (four) hours as needed for up to 10 days for moderate pain. What changed:    how much to take  when to take this  reasons to take this   ibuprofen 200 MG tablet Commonly known as:  ADVIL,MOTRIN Take 400-600 mg by mouth every 8 (eight) hours as needed for moderate pain.   methocarbamol 500 MG tablet Commonly known as:  ROBAXIN Take 1 tablet (500 mg total) by mouth every 6 (six) hours as needed for muscle spasms.   oxyCODONE 5 MG immediate release tablet Commonly known as:  ROXICODONE Take 1-2 tablets (5-10 mg total) by mouth every 6 (six) hours as needed for severe pain.      Follow-up Information    Nicholes Stairs, MD In 3 weeks.   Specialty:  Orthopedic Surgery Why:  For wound re-check Contact information: [address] [place] [address] [phone]           Signed: Geralynn Rile, MD Orthopaedic Surgery 06/24/2018, 12:03 PM

## 2018-11-08 ENCOUNTER — Other Ambulatory Visit (HOSPITAL_COMMUNITY): Payer: Self-pay | Admitting: Orthopedic Surgery

## 2018-12-15 NOTE — Pre-Procedure Instructions (Signed)
[name]  12/15/2018      [facility] - [place] - [address] [address] [address] [address] [place] [address] Phone: [phone] Fax: [phone]    Your procedure is scheduled on Thursday January 23rd.  Report to [facility] Admitting at 0530 A.M.  Call this number if you have problems the morning of surgery:  [phone]   Remember:  Do not eat or drink after midnight.    Take these medicines the morning of surgery with A SIP OF WATER  DULoxetine (CYMBALTA)  7 days prior to surgery STOP taking any Aspirin (unless otherwise instructed by your surgeon), Aleve, Naproxen, Ibuprofen, Motrin, Advil, Goody's, BC's, all herbal medications, fish oil, and all vitamins.     Do not wear jewelry, make-up or nail polish.  Do not wear lotions, powders, or perfumes, or deodorant.  Do not shave 48 hours prior to surgery.  Men may shave face and neck.  Do not bring valuables to the hospital.  [facility] is not responsible for any belongings or valuables.  Contacts, dentures or bridgework may not be worn into surgery.  Leave your suitcase in the car.  After surgery it may be brought to your room.  For patients admitted to the hospital, discharge time will be determined by your treatment team.  Patients discharged the day of surgery will not be allowed to drive home.    [place]- Preparing For Surgery  Before surgery, you can play an important role. Because skin is not sterile, your skin needs to be as free of germs as possible. You can reduce the number of germs on your skin by washing with CHG (chlorahexidine gluconate) Soap before surgery.  CHG is an antiseptic cleaner which kills germs and bonds with the skin to continue killing germs even after washing.    Oral Hygiene is also important to reduce your risk of infection.  Remember - BRUSH YOUR TEETH THE MORNING OF SURGERY WITH YOUR REGULAR TOOTHPASTE  Please do not  use if you have an allergy to CHG or antibacterial soaps. If your skin becomes reddened/irritated stop using the CHG.  Do not shave (including legs and underarms) for at least 48 hours prior to first CHG shower. It is OK to shave your face.  Please follow these instructions carefully.   1. Shower the NIGHT BEFORE SURGERY and the MORNING OF SURGERY with CHG.   2. If you chose to wash your hair, wash your hair first as usual with your normal shampoo.  3. After you shampoo, rinse your hair and body thoroughly to remove the shampoo.  4. Use CHG as you would any other liquid soap. You can apply CHG directly to the skin and wash gently with a scrungie or a clean washcloth.   5. Apply the CHG Soap to your body ONLY FROM THE NECK DOWN.  Do not use on open wounds or open sores. Avoid contact with your eyes, ears, mouth and genitals (private parts). Wash Face and genitals (private parts)  with your normal soap.  6. Wash thoroughly, paying special attention to the area where your surgery will be performed.  7. Thoroughly rinse your body with warm water from the neck down.  8. DO NOT shower/wash with your normal soap after using and rinsing off the CHG Soap.  9. Pat yourself dry with a CLEAN TOWEL.  10. Wear CLEAN PAJAMAS to bed the night before surgery, wear comfortable clothes  the morning of surgery  11. Place CLEAN SHEETS on your bed the night of your first shower and DO NOT SLEEP WITH PETS.    Day of Surgery:  Do not apply any deodorants/lotions.  Please wear clean clothes to the hospital/surgery center.   Remember to brush your teeth WITH YOUR REGULAR TOOTHPASTE.    Please read over the following fact sheets that you were given.

## 2018-12-16 ENCOUNTER — Other Ambulatory Visit: Payer: Self-pay

## 2018-12-16 ENCOUNTER — Encounter (HOSPITAL_COMMUNITY)
Admission: RE | Admit: 2018-12-16 | Discharge: 2018-12-16 | Disposition: A | Discharge status: Home / Self Care | Payer: No Typology Code available for payment source | Source: Ambulatory Visit | Attending: Orthopedic Surgery | Admitting: Orthopedic Surgery

## 2018-12-16 ENCOUNTER — Encounter (HOSPITAL_COMMUNITY): Payer: Self-pay

## 2018-12-16 DIAGNOSIS — Z01812 Encounter for preprocedural laboratory examination: Secondary | ICD-10-CM | POA: Insufficient documentation

## 2018-12-16 HISTORY — DX: Myoneural disorder, unspecified: G70.9

## 2018-12-16 LAB — CBC
HCT: 43.2 % (ref 36.0–46.0)
Hemoglobin: 13.7 g/dL (ref 12.0–15.0)
MCH: 29.7 pg (ref 26.0–34.0)
MCHC: 31.7 g/dL (ref 30.0–36.0)
MCV: 93.5 fL (ref 80.0–100.0)
Platelets: 323 10*3/uL (ref 150–400)
RBC: 4.62 MIL/uL (ref 3.87–5.11)
RDW: 13.2 % (ref 11.5–15.5)
WBC: 9.5 10*3/uL (ref 4.0–10.5)
nRBC: 0 % (ref 0.0–0.2)

## 2018-12-16 LAB — BASIC METABOLIC PANEL
Anion gap: 10 (ref 5–15)
BUN: 8 mg/dL (ref 6–20)
CO2: 25 mmol/L (ref 22–32)
Calcium: 9 mg/dL (ref 8.9–10.3)
Chloride: 103 mmol/L (ref 98–111)
Creatinine, Ser: 0.84 mg/dL (ref 0.44–1.00)
GFR calc Af Amer: >60 mL/min (ref >60)
GFR calc non Af Amer: >60 mL/min (ref >60)
Glucose, Bld: 93 mg/dL (ref 70–99)
Potassium: 3.9 mmol/L (ref 3.5–5.1)
SODIUM: 138 mmol/L (ref 135–145)

## 2018-12-16 NOTE — Progress Notes (Signed)
Pt has history of Malignant hypothermia but hasn't had any recent issues with her past 3-4 surgerys PCP - none  Chest x-ray - N/A EKG - N/A ECHO - > 20 yrs ago  [place] Instructions: N/A Aspirin Instructions: N/A  Anesthesia review: none  Patient denies shortness of breath, fever, cough and chest pain at PAT appointment   Patient verbalized understanding of instructions that were given to them at the PAT appointment. Patient was also instructed that they will need to review over the PAT instructions again at home before surgery.

## 2018-12-16 NOTE — Progress Notes (Signed)
Anesthesia Chart Review:  Case:  [id number] Date/Time:  12/23/18 0715   Procedures:      right tibiotalar calcaneal arthrodesis (Right ) - 180min     RIGHT ANKLE REMOVAL OF DEEP IMPLANTS (Right )     RIGHT MEDIAL MALLEOLUS OSTEOTOMY (Right )   Anesthesia type:  General   Pre-op diagnosis:  right displaced trimalleolar fracture with nonunion; post traumatic osteoarthritis right ankle   Location:  [facility] 06 / [facility]   Surgeon:  Toni ArthursHewitt, John, MD      DISCUSSION: Patient is a 48 year old female scheduled for the above procedure. Patient fell on 04/12/18 and sustained a trimalleolar displaced fracture of the right ankle. She has since undergone ORIF 04/23/18; ORIF revision with hardware removal and external fixation right trimalleolar ankle fracture 05/18/18; removal of multiplane external fixator right ankle and percutaneous fixation of right ankle trimalleolar fracture 06/14/18.  Other history includes never smoker, post-operative N/V, spina bifida (back surgery in 1971, 2012), hydrocephalus (s/p right frontal VP shunt 08/01/05), Chiari malformation type II, GERD, neuropathy. Her grandfather died after anesthesia from MALIGNANT HYPERTHERMIA. 2006 notes indicate patient biopsy positive for malignant hyperthermia. She has had trigger free anesthesia in the past.   Anesthesiologist to evaluate on the day of surgery.   VS: BP 117/68   Pulse 75   Temp 36.9 C   Ht 5' (1.524 m)   Wt 128.1 kg Comment: pt states that she weighed in June of 2019  SpO2 100%   BMI 55.17 kg/m    PROVIDERS: Patient, No Pcp Per Trey Sailorsoy, Mark, [place] in [place]. Last visit 10/05/18 [facility] Everywhere).  LABS: Labs reviewed: Acceptable for surgery. (all labs ordered are listed, but only abnormal results are displayed)  Labs Reviewed  CBC  BASIC METABOLIC PANEL    IMAGES: N/A   EKG: N/A   CV: She reported an echo > 20 years ago.   Past Medical History:  Diagnosis Date  . Chiari malformation type II  (HCC)   . Family history of adverse reaction to anesthesia    grandfather died from anesthesia; grandfather had malignant hyperthermia  . GERD (gastroesophageal reflux disease)   . Hydrocephalus (HCC)   . Malignant hyperthermia   . Neuromuscular disorder (HCC)    Neuropathy  . Osteoarthritis   . PONV (postoperative nausea and vomiting)   . Spina bifida [facility](HCC)     Past Surgical History:  Procedure Laterality Date  . ANKLE ARTHROSCOPY Left    related to Tendon  . BACK SURGERY  1971   Spinal Bifida  . BACK SURGERY  2012   fusion  . EXTERNAL FIXATION LEG Right 05/18/2018   Procedure: EXTERNAL FIXATION LEG;  Surgeon: Yolonda Kidaogers, Jason Patrick, MD;  [facility];  Service: Orthopedics;  Laterality: Right;  . EXTERNAL FIXATION REMOVAL Right 06/14/2018   Procedure: Right ankle external fixator removal with steinman pin placement;  Surgeon: Yolonda Kidaogers, Jason Patrick, MD;  [facility];  Service: Orthopedics;  Laterality: Right;  60 mins  . HARDWARE REMOVAL Right 05/18/2018   Procedure: HARDWARE REMOVAL;  Surgeon: Yolonda Kidaogers, Jason Patrick, MD;  [facility];  Service: Orthopedics;  Laterality: Right;  . KNEE ARTHROSCOPY Left 1983  . ORIF ANKLE FRACTURE Right 04/23/2018   Procedure: OPEN REDUCTION INTERNAL FIXATION (ORIF) TRIMALLEOLAR ANKLE FRACTURE;  Surgeon: Yolonda Kidaogers, Jason Patrick, MD;  Location: [facility] ORS;  Service: Orthopedics;  Laterality: Right;  120 mins  . ORIF ANKLE FRACTURE Right 05/18/2018   Procedure: Right  ankle open reduction internal fixation revision;  Surgeon: Yolonda Kidaogers, Jason Patrick, MD;  [facility];  Service: Orthopedics;  Laterality: Right;  120 mins  . SHUNT EXTERNALIZATION     Head x 2  . WISDOM TOOTH EXTRACTION      MEDICATIONS: . DULoxetine (CYMBALTA) 30 MG capsule  . ibuprofen (ADVIL,MOTRIN) 200 MG tablet   No current facility-administered medications for this encounter.     Shonna ChockAllison Toneisha Savary, PA-C Surgical Short Stay/Anesthesiology [facility] Phone [phone] [facility] Phone  [phone] 12/17/2018 5:43 PM

## 2018-12-17 NOTE — Anesthesia Preprocedure Evaluation (Addendum)
Anesthesia Evaluation  Patient identified by MRN, date of birth, ID band Patient awake    Reviewed: Allergy & Precautions, H&P , NPO status , Patient's Chart, lab work & pertinent test results  History of Anesthesia Complications (+) PONV, MALIGNANT HYPERTHERMIA and Family history of anesthesia reaction  Airway Mallampati: II  TM Distance: >3 FB Neck ROM: Full    Dental no notable dental hx. (+) Teeth Intact, Dental Advisory Given   Pulmonary neg pulmonary ROS,    Pulmonary exam normal breath sounds clear to auscultation       Cardiovascular Exercise Tolerance: Good negative cardio ROS   Rhythm:Regular Rate:Normal     Neuro/Psych  Neuromuscular disease negative psych ROS   GI/Hepatic Neg liver ROS, GERD  Controlled,  Endo/Other  Morbid obesity  Renal/GU negative Renal ROS  negative genitourinary   Musculoskeletal  (+) Arthritis ,   Abdominal   Peds  Hematology  (+) Blood dyscrasia, anemia ,   Anesthesia Other Findings   Reproductive/Obstetrics negative OB ROS                           Anesthesia Physical Anesthesia Plan  ASA: III  Anesthesia Plan: General   Post-op Pain Management:  Regional for Post-op pain   Induction: Intravenous  PONV Risk Score and Plan: 4 or greater and Ondansetron, Dexamethasone, Midazolam, Propofol infusion and TIVA  Airway Management Planned: Oral ETT  Additional Equipment:   Intra-op Plan:   Post-operative Plan: Extubation in OR  Informed Consent: I have reviewed the patients History and Physical, chart, labs and discussed the procedure including the risks, benefits and alternatives for the proposed anesthesia with the patient or authorized representative who has indicated his/her understanding and acceptance.     Dental advisory given  Plan Discussed with: CRNA  Anesthesia Plan Comments: (PAT note written 12/17/2018 by Shonna Chock, PA-C.  History of Malignant Hyperthermia and spina bifida. )      Anesthesia Quick Evaluation

## 2018-12-22 MED ORDER — DEXTROSE 5 % IV SOLN
3.0000 g | INTRAVENOUS | Status: AC
  Administered 2018-12-23: 3 g via INTRAVENOUS
  Filled 2018-12-22: qty 3

## 2018-12-23 ENCOUNTER — Inpatient Hospital Stay (HOSPITAL_COMMUNITY): Payer: No Typology Code available for payment source

## 2018-12-23 ENCOUNTER — Encounter (HOSPITAL_COMMUNITY): Payer: Self-pay | Admitting: *Deleted

## 2018-12-23 ENCOUNTER — Inpatient Hospital Stay (HOSPITAL_COMMUNITY): Payer: No Typology Code available for payment source | Admitting: Certified Registered"

## 2018-12-23 ENCOUNTER — Encounter (HOSPITAL_COMMUNITY)
Admission: RE | Disposition: A | Discharge status: Home / Self Care | Payer: Self-pay | Source: Home / Self Care | Attending: Internal Medicine

## 2018-12-23 ENCOUNTER — Other Ambulatory Visit: Payer: Self-pay

## 2018-12-23 ENCOUNTER — Inpatient Hospital Stay (HOSPITAL_COMMUNITY): Payer: No Typology Code available for payment source | Admitting: Physician Assistant

## 2018-12-23 ENCOUNTER — Inpatient Hospital Stay (HOSPITAL_COMMUNITY)
Admission: RE | Admit: 2018-12-23 | Discharge: 2018-12-27 | DRG: 493 | Disposition: A | Discharge status: Home / Self Care | Payer: No Typology Code available for payment source | Attending: Family Medicine | Admitting: Family Medicine

## 2018-12-23 DIAGNOSIS — R072 Precordial pain: Secondary | ICD-10-CM | POA: Diagnosis not present

## 2018-12-23 DIAGNOSIS — K449 Diaphragmatic hernia without obstruction or gangrene: Secondary | ICD-10-CM | POA: Diagnosis present

## 2018-12-23 DIAGNOSIS — Z419 Encounter for procedure for purposes other than remedying health state, unspecified: Secondary | ICD-10-CM

## 2018-12-23 DIAGNOSIS — S82401K Unspecified fracture of shaft of right fibula, subsequent encounter for closed fracture with nonunion: Secondary | ICD-10-CM | POA: Diagnosis present

## 2018-12-23 DIAGNOSIS — R0789 Other chest pain: Secondary | ICD-10-CM | POA: Diagnosis not present

## 2018-12-23 DIAGNOSIS — Z23 Encounter for immunization: Secondary | ICD-10-CM | POA: Diagnosis not present

## 2018-12-23 DIAGNOSIS — K5900 Constipation, unspecified: Secondary | ICD-10-CM | POA: Diagnosis not present

## 2018-12-23 DIAGNOSIS — Z8249 Family history of ischemic heart disease and other diseases of the circulatory system: Secondary | ICD-10-CM

## 2018-12-23 DIAGNOSIS — S8251XP Displaced fracture of medial malleolus of right tibia, subsequent encounter for closed fracture with malunion: Secondary | ICD-10-CM | POA: Diagnosis present

## 2018-12-23 DIAGNOSIS — Z91048 Other nonmedicinal substance allergy status: Secondary | ICD-10-CM

## 2018-12-23 DIAGNOSIS — Z6841 Body Mass Index (BMI) 40.0 and over, adult: Secondary | ICD-10-CM

## 2018-12-23 DIAGNOSIS — X58XXXD Exposure to other specified factors, subsequent encounter: Secondary | ICD-10-CM | POA: Diagnosis present

## 2018-12-23 DIAGNOSIS — Z981 Arthrodesis status: Secondary | ICD-10-CM

## 2018-12-23 DIAGNOSIS — I10 Essential (primary) hypertension: Secondary | ICD-10-CM | POA: Diagnosis present

## 2018-12-23 DIAGNOSIS — Z884 Allergy status to anesthetic agent status: Secondary | ICD-10-CM

## 2018-12-23 DIAGNOSIS — S8251XA Displaced fracture of medial malleolus of right tibia, initial encounter for closed fracture: Secondary | ICD-10-CM | POA: Diagnosis present

## 2018-12-23 DIAGNOSIS — M19171 Post-traumatic osteoarthritis, right ankle and foot: Secondary | ICD-10-CM | POA: Diagnosis present

## 2018-12-23 DIAGNOSIS — Z87728 Personal history of other specified (corrected) congenital malformations of nervous system and sense organs: Secondary | ICD-10-CM

## 2018-12-23 DIAGNOSIS — Z8669 Personal history of other diseases of the nervous system and sense organs: Secondary | ICD-10-CM

## 2018-12-23 DIAGNOSIS — G629 Polyneuropathy, unspecified: Secondary | ICD-10-CM | POA: Diagnosis present

## 2018-12-23 DIAGNOSIS — Z7982 Long term (current) use of aspirin: Secondary | ICD-10-CM

## 2018-12-23 DIAGNOSIS — Z8679 Personal history of other diseases of the circulatory system: Secondary | ICD-10-CM | POA: Diagnosis not present

## 2018-12-23 DIAGNOSIS — D72829 Elevated white blood cell count, unspecified: Secondary | ICD-10-CM | POA: Diagnosis not present

## 2018-12-23 DIAGNOSIS — Z91040 Latex allergy status: Secondary | ICD-10-CM

## 2018-12-23 DIAGNOSIS — K219 Gastro-esophageal reflux disease without esophagitis: Secondary | ICD-10-CM | POA: Diagnosis present

## 2018-12-23 DIAGNOSIS — Z807 Family history of other malignant neoplasms of lymphoid, hematopoietic and related tissues: Secondary | ICD-10-CM

## 2018-12-23 DIAGNOSIS — Z885 Allergy status to narcotic agent status: Secondary | ICD-10-CM

## 2018-12-23 DIAGNOSIS — Z79899 Other long term (current) drug therapy: Secondary | ICD-10-CM

## 2018-12-23 HISTORY — PX: TIBIA OSTEOTOMY: SHX1065

## 2018-12-23 HISTORY — PX: ANKLE FUSION: SHX881

## 2018-12-23 HISTORY — PX: ANKLE SURGERY: SHX546

## 2018-12-23 HISTORY — PX: HARDWARE REMOVAL: SHX979

## 2018-12-23 LAB — CREATININE, SERUM
Creatinine, Ser: 0.99 mg/dL (ref 0.44–1.00)
GFR calc Af Amer: >60 mL/min
GFR calc non Af Amer: >60 mL/min

## 2018-12-23 LAB — CBC
HCT: 39.5 % (ref 36.0–46.0)
Hemoglobin: 13 g/dL (ref 12.0–15.0)
MCH: 30.1 pg (ref 26.0–34.0)
MCHC: 32.9 g/dL (ref 30.0–36.0)
MCV: 91.4 fL (ref 80.0–100.0)
Platelets: 324 K/uL (ref 150–400)
RBC: 4.32 MIL/uL (ref 3.87–5.11)
RDW: 13.2 % (ref 11.5–15.5)
WBC: 18.6 K/uL — ABNORMAL HIGH (ref 4.0–10.5)
nRBC: 0 % (ref 0.0–0.2)

## 2018-12-23 LAB — POCT PREGNANCY, URINE: Preg Test, Ur: NEGATIVE

## 2018-12-23 SURGERY — ARTHRODESIS ANKLE
Anesthesia: Regional | Laterality: Right

## 2018-12-23 MED ORDER — METHOCARBAMOL 500 MG PO TABS
500.0000 mg | ORAL_TABLET | Freq: Four times a day (QID) | ORAL | Status: DC | PRN
  Administered 2018-12-23 – 2018-12-26 (×8): 500 mg via ORAL
  Filled 2018-12-23 (×8): qty 1

## 2018-12-23 MED ORDER — VANCOMYCIN HCL 500 MG IV SOLR
INTRAVENOUS | Status: DC | PRN
  Administered 2018-12-23: 500 mg via TOPICAL

## 2018-12-23 MED ORDER — PROPOFOL 10 MG/ML IV BOLUS
INTRAVENOUS | Status: AC
  Filled 2018-12-23: qty 20

## 2018-12-23 MED ORDER — PROPOFOL 10 MG/ML IV BOLUS
INTRAVENOUS | Status: DC | PRN
  Administered 2018-12-23: 200 mg via INTRAVENOUS
  Administered 2018-12-23: 100 mg via INTRAVENOUS

## 2018-12-23 MED ORDER — LIDOCAINE 2% (20 MG/ML) 5 ML SYRINGE
INTRAMUSCULAR | Status: DC | PRN
  Administered 2018-12-23: 40 mg via INTRAVENOUS

## 2018-12-23 MED ORDER — MIDAZOLAM HCL 5 MG/5ML IJ SOLN
INTRAMUSCULAR | Status: DC | PRN
  Administered 2018-12-23: 2 mg via INTRAVENOUS

## 2018-12-23 MED ORDER — CHLORHEXIDINE GLUCONATE 4 % EX LIQD: 60.0000 mL | Freq: Once | CUTANEOUS | Status: DC

## 2018-12-23 MED ORDER — ENOXAPARIN SODIUM 40 MG/0.4ML ~~LOC~~ SOLN
40.0000 mg | SUBCUTANEOUS | Status: DC
  Administered 2018-12-24 – 2018-12-27 (×4): 40 mg via SUBCUTANEOUS
  Filled 2018-12-23 (×4): qty 0.4

## 2018-12-23 MED ORDER — SUGAMMADEX SODIUM 200 MG/2ML IV SOLN
INTRAVENOUS | Status: DC | PRN
  Administered 2018-12-23: 300 mg via INTRAVENOUS

## 2018-12-23 MED ORDER — GLYCOPYRROLATE PF 0.2 MG/ML IJ SOSY
PREFILLED_SYRINGE | INTRAMUSCULAR | Status: AC
  Filled 2018-12-23: qty 1

## 2018-12-23 MED ORDER — ACETAMINOPHEN 500 MG PO TABS
1000.0000 mg | ORAL_TABLET | Freq: Once | ORAL | Status: AC
  Administered 2018-12-23: 1000 mg via ORAL
  Filled 2018-12-23: qty 2

## 2018-12-23 MED ORDER — BUPIVACAINE HCL (PF) 0.5 % IJ SOLN
INTRAMUSCULAR | Status: DC | PRN
  Administered 2018-12-23: 30 mL via PERINEURAL
  Administered 2018-12-23: 10 mL

## 2018-12-23 MED ORDER — PROPOFOL 500 MG/50ML IV EMUL
INTRAVENOUS | Status: DC | PRN
  Administered 2018-12-23: 200 ug/kg/min via INTRAVENOUS

## 2018-12-23 MED ORDER — PROPOFOL 1000 MG/100ML IV EMUL
INTRAVENOUS | Status: AC
  Filled 2018-12-23: qty 100

## 2018-12-23 MED ORDER — ROCURONIUM BROMIDE 50 MG/5ML IV SOSY
PREFILLED_SYRINGE | INTRAVENOUS | Status: DC | PRN
  Administered 2018-12-23: 70 mg via INTRAVENOUS

## 2018-12-23 MED ORDER — DEXAMETHASONE SODIUM PHOSPHATE 10 MG/ML IJ SOLN
INTRAMUSCULAR | Status: DC | PRN
  Administered 2018-12-23: 10 mg via INTRAVENOUS

## 2018-12-23 MED ORDER — SUGAMMADEX SODIUM 500 MG/5ML IV SOLN
INTRAVENOUS | Status: AC
  Filled 2018-12-23: qty 5

## 2018-12-23 MED ORDER — ROCURONIUM BROMIDE 50 MG/5ML IV SOSY
PREFILLED_SYRINGE | INTRAVENOUS | Status: AC
  Filled 2018-12-23: qty 10

## 2018-12-23 MED ORDER — ACETAMINOPHEN 325 MG PO TABS: 325.0000 mg | ORAL_TABLET | Freq: Four times a day (QID) | ORAL | Status: DC | PRN

## 2018-12-23 MED ORDER — METHOCARBAMOL 1000 MG/10ML IJ SOLN
500.0000 mg | Freq: Four times a day (QID) | INTRAVENOUS | Status: DC | PRN
  Filled 2018-12-23: qty 5

## 2018-12-23 MED ORDER — VANCOMYCIN HCL 500 MG IV SOLR
INTRAVENOUS | Status: AC
  Filled 2018-12-23: qty 500

## 2018-12-23 MED ORDER — MENTHOL 3 MG MT LOZG
1.0000 | LOZENGE | OROMUCOSAL | Status: DC | PRN
  Filled 2018-12-23: qty 9

## 2018-12-23 MED ORDER — FENTANYL CITRATE (PF) 250 MCG/5ML IJ SOLN
INTRAMUSCULAR | Status: AC
  Filled 2018-12-23: qty 5

## 2018-12-23 MED ORDER — LACTATED RINGERS IV SOLN
INTRAVENOUS | Status: DC | PRN
  Administered 2018-12-23 (×2): via INTRAVENOUS

## 2018-12-23 MED ORDER — OXYCODONE HCL 5 MG PO TABS
5.0000 mg | ORAL_TABLET | ORAL | Status: DC | PRN
  Administered 2018-12-23: 5 mg via ORAL
  Administered 2018-12-23 – 2018-12-24 (×2): 10 mg via ORAL
  Filled 2018-12-23 (×4): qty 2
  Filled 2018-12-23: qty 1
  Filled 2018-12-23: qty 2

## 2018-12-23 MED ORDER — OXYCODONE HCL 5 MG PO TABS
10.0000 mg | ORAL_TABLET | ORAL | Status: DC | PRN
  Administered 2018-12-24 (×3): 10 mg via ORAL
  Filled 2018-12-23: qty 2

## 2018-12-23 MED ORDER — ONDANSETRON HCL 4 MG/2ML IJ SOLN
INTRAMUSCULAR | Status: DC | PRN
  Administered 2018-12-23: 4 mg via INTRAVENOUS

## 2018-12-23 MED ORDER — SENNA 8.6 MG PO TABS
1.0000 | ORAL_TABLET | Freq: Two times a day (BID) | ORAL | Status: DC
  Administered 2018-12-23 – 2018-12-27 (×7): 8.6 mg via ORAL
  Filled 2018-12-23 (×8): qty 1

## 2018-12-23 MED ORDER — ONDANSETRON HCL 4 MG PO TABS: 4.0000 mg | ORAL_TABLET | Freq: Four times a day (QID) | ORAL | Status: DC | PRN

## 2018-12-23 MED ORDER — HYDROMORPHONE HCL 1 MG/ML IJ SOLN: 0.2500 mg | INTRAMUSCULAR | Status: DC | PRN

## 2018-12-23 MED ORDER — DOCUSATE SODIUM 100 MG PO CAPS
100.0000 mg | ORAL_CAPSULE | Freq: Two times a day (BID) | ORAL | Status: DC
  Administered 2018-12-23 – 2018-12-27 (×7): 100 mg via ORAL
  Filled 2018-12-23 (×8): qty 1

## 2018-12-23 MED ORDER — DULOXETINE HCL 30 MG PO CPEP
30.0000 mg | ORAL_CAPSULE | Freq: Every day | ORAL | Status: DC
  Administered 2018-12-23 – 2018-12-26 (×4): 30 mg via ORAL
  Filled 2018-12-23 (×5): qty 1

## 2018-12-23 MED ORDER — DEXAMETHASONE SODIUM PHOSPHATE 10 MG/ML IJ SOLN
INTRAMUSCULAR | Status: AC
  Filled 2018-12-23: qty 1

## 2018-12-23 MED ORDER — LIDOCAINE 2% (20 MG/ML) 5 ML SYRINGE
INTRAMUSCULAR | Status: AC
  Filled 2018-12-23: qty 5

## 2018-12-23 MED ORDER — FENTANYL CITRATE (PF) 100 MCG/2ML IJ SOLN
INTRAMUSCULAR | Status: DC | PRN
  Administered 2018-12-23 (×2): 50 ug via INTRAVENOUS

## 2018-12-23 MED ORDER — MIDAZOLAM HCL 2 MG/2ML IJ SOLN
INTRAMUSCULAR | Status: AC
  Filled 2018-12-23: qty 2

## 2018-12-23 MED ORDER — SODIUM CHLORIDE 0.9 % IV SOLN: INTRAVENOUS | Status: DC

## 2018-12-23 MED ORDER — ONDANSETRON HCL 4 MG/2ML IJ SOLN
INTRAMUSCULAR | Status: AC
  Filled 2018-12-23: qty 2

## 2018-12-23 MED ORDER — INFLUENZA VAC SPLIT QUAD 0.5 ML IM SUSY
0.5000 mL | PREFILLED_SYRINGE | INTRAMUSCULAR | Status: AC
  Administered 2018-12-24: 0.5 mL via INTRAMUSCULAR
  Filled 2018-12-23: qty 0.5

## 2018-12-23 MED ORDER — SODIUM CHLORIDE 0.9 % IR SOLN
Status: DC | PRN
  Administered 2018-12-23: 1000 mL

## 2018-12-23 MED ORDER — HYDROMORPHONE HCL 1 MG/ML IJ SOLN
0.5000 mg | INTRAMUSCULAR | Status: DC | PRN
  Administered 2018-12-23 – 2018-12-25 (×2): 1 mg via INTRAVENOUS
  Filled 2018-12-23 (×3): qty 1

## 2018-12-23 MED ORDER — ONDANSETRON HCL 4 MG/2ML IJ SOLN: 4.0000 mg | Freq: Four times a day (QID) | INTRAMUSCULAR | Status: DC | PRN

## 2018-12-23 MED ORDER — DIPHENHYDRAMINE HCL 12.5 MG/5ML PO ELIX
12.5000 mg | ORAL_SOLUTION | ORAL | Status: DC | PRN
  Administered 2018-12-24: 12.5 mg via ORAL
  Administered 2018-12-24 – 2018-12-25 (×2): 25 mg via ORAL
  Filled 2018-12-23 (×4): qty 10

## 2018-12-23 MED ORDER — PHENYLEPHRINE 40 MCG/ML (10ML) SYRINGE FOR IV PUSH (FOR BLOOD PRESSURE SUPPORT)
PREFILLED_SYRINGE | INTRAVENOUS | Status: AC
  Filled 2018-12-23: qty 10

## 2018-12-23 MED ORDER — SODIUM CHLORIDE 0.9 % IV SOLN
INTRAVENOUS | Status: DC
  Administered 2018-12-23 – 2018-12-24 (×2): via INTRAVENOUS

## 2018-12-23 SURGICAL SUPPLY — 105 items
BANDAGE ESMARK 6X9 LF (GAUZE/BANDAGES/DRESSINGS) ×1 IMPLANT
BIT DRILL CALIBRATED 4.3X320MM (BIT) IMPLANT
BIT DRILL CANN 7X200 (BIT) ×2 IMPLANT
BLADE LONG MED 31MMX9MM (MISCELLANEOUS) ×1
BLADE LONG MED 31X9 (MISCELLANEOUS) ×1 IMPLANT
BLADE SAGITTAL (BLADE)
BLADE SAGITTAL 25.0X1.19X90 (BLADE) IMPLANT
BLADE SAGITTAL 25.0X1.19X90MM (BLADE)
BLADE SAW SGTL HD 18.5X60.5X1. (BLADE) ×3 IMPLANT
BLADE SAW THK.89X75X18XSGTL (BLADE) IMPLANT
BLADE SURG 10 STRL SS (BLADE) ×3 IMPLANT
BNDG CMPR 9X6 STRL LF SNTH (GAUZE/BANDAGES/DRESSINGS) ×1
BNDG COHESIVE 4X5 TAN STRL (GAUZE/BANDAGES/DRESSINGS) ×3 IMPLANT
BNDG COHESIVE 6X5 TAN STRL LF (GAUZE/BANDAGES/DRESSINGS) ×3 IMPLANT
BNDG ESMARK 6X9 LF (GAUZE/BANDAGES/DRESSINGS) ×3
BUR SURG 4X8 MED (BURR) IMPLANT
BURR SURG 4MMX8MM MEDIUM (BURR) ×1
BURR SURG 4X8 MED (BURR) ×2
CANISTER SUCT 3000ML PPV (MISCELLANEOUS) ×3 IMPLANT
CHLORAPREP W/TINT 10.5 ML (MISCELLANEOUS) ×3 IMPLANT
CHLORAPREP W/TINT 26ML (MISCELLANEOUS) ×3 IMPLANT
CLOSURE WOUND 1/2 X4 (GAUZE/BANDAGES/DRESSINGS) ×1
COVER SURGICAL LIGHT HANDLE (MISCELLANEOUS) ×3 IMPLANT
COVER WAND RF STERILE (DRAPES) ×3 IMPLANT
CUFF TOURNIQUET SINGLE 34IN LL (TOURNIQUET CUFF) ×3 IMPLANT
CUFF TOURNIQUET SINGLE 44IN (TOURNIQUET CUFF) IMPLANT
DRAPE C-ARM 42X72 X-RAY (DRAPES) ×3 IMPLANT
DRAPE C-ARMOR (DRAPES) ×3 IMPLANT
DRAPE IMP U-DRAPE 54X76 (DRAPES) ×3 IMPLANT
DRAPE INCISE IOBAN 66X45 STRL (DRAPES) ×3 IMPLANT
DRAPE U-SHAPE 47X51 STRL (DRAPES) ×3 IMPLANT
DRILL CALIBRATED 4.3X320MM (BIT) ×3
DRSG ADAPTIC 3X8 NADH LF (GAUZE/BANDAGES/DRESSINGS) ×3 IMPLANT
DRSG MEPILEX BORDER 4X4 (GAUZE/BANDAGES/DRESSINGS) ×3 IMPLANT
DRSG MEPITEL 4X7.2 (GAUZE/BANDAGES/DRESSINGS) ×4 IMPLANT
DRSG PAD ABDOMINAL 8X10 ST (GAUZE/BANDAGES/DRESSINGS) ×9 IMPLANT
DRSG TEGADERM 4X4.75 (GAUZE/BANDAGES/DRESSINGS) ×3 IMPLANT
ELECT REM PT RETURN 9FT ADLT (ELECTROSURGICAL) ×3
ELECTRODE REM PT RTRN 9FT ADLT (ELECTROSURGICAL) ×1 IMPLANT
EVACUATOR 1/8 PVC DRAIN (DRAIN) ×3 IMPLANT
GAUZE SPONGE 2X2 8PLY STRL LF (GAUZE/BANDAGES/DRESSINGS) ×1 IMPLANT
GAUZE SPONGE 4X4 12PLY STRL (GAUZE/BANDAGES/DRESSINGS) ×6 IMPLANT
GLOVE BIO SURGEON STRL SZ7 (GLOVE) ×6 IMPLANT
GLOVE BIO SURGEON STRL SZ8 (GLOVE) ×3 IMPLANT
GLOVE BIOGEL PI IND STRL 7.5 (GLOVE) ×1 IMPLANT
GLOVE BIOGEL PI IND STRL 8 (GLOVE) ×2 IMPLANT
GLOVE BIOGEL PI INDICATOR 7.5 (GLOVE) ×2
GLOVE BIOGEL PI INDICATOR 8 (GLOVE) ×4
GLOVE ECLIPSE 8.0 STRL XLNG CF (GLOVE) ×6 IMPLANT
GOWN STRL REUS W/ TWL LRG LVL3 (GOWN DISPOSABLE) ×2 IMPLANT
GOWN STRL REUS W/ TWL XL LVL3 (GOWN DISPOSABLE) ×2 IMPLANT
GOWN STRL REUS W/TWL LRG LVL3 (GOWN DISPOSABLE) ×6
GOWN STRL REUS W/TWL XL LVL3 (GOWN DISPOSABLE) ×6
GUIDEPIN 3.2X17.5 THRD DISP (PIN) ×2 IMPLANT
GUIDEWIRE 2.6X80 BEAD TIP (WIRE) IMPLANT
GUIDWIRE 2.6X80 BEAD TIP (WIRE) ×3
K-WIRE ACE 1.6X6 (WIRE) ×6
KIT BASIN OR (CUSTOM PROCEDURE TRAY) ×3 IMPLANT
KIT TURNOVER KIT B (KITS) ×3 IMPLANT
KWIRE ACE 1.6X6 (WIRE) IMPLANT
NAIL LOCK ANKLE ANTE 10X150 (Nail) ×2 IMPLANT
NEEDLE 22X1 1/2 (OR ONLY) (NEEDLE) IMPLANT
NS IRRIG 1000ML POUR BTL (IV SOLUTION) ×3 IMPLANT
PACK ORTHO EXTREMITY (CUSTOM PROCEDURE TRAY) ×3 IMPLANT
PACK UNIVERSAL I (CUSTOM PROCEDURE TRAY) ×6 IMPLANT
PAD ABD 8X10 STRL (GAUZE/BANDAGES/DRESSINGS) ×4 IMPLANT
PAD ARMBOARD 7.5X6 YLW CONV (MISCELLANEOUS) ×6 IMPLANT
PAD CAST 4YDX4 CTTN HI CHSV (CAST SUPPLIES) ×3 IMPLANT
PADDING CAST COTTON 4X4 STRL (CAST SUPPLIES) ×9
PLATE SPIDER 16 (Washer) ×4 IMPLANT
SCREW ACE CAN 4.0 40M (Screw) ×2 IMPLANT
SCREW ACE CAN 4.0 42M (Screw) ×2 IMPLANT
SCREW ACE CAN 4.0 44M (Screw) ×2 IMPLANT
SCREW ACE CAN 4.0 48M (Screw) ×2 IMPLANT
SCREW CORT TI DBL LEAD 5X22 (Screw) ×2 IMPLANT
SCREW CORT TI DBL LEAD 5X26 (Screw) ×2 IMPLANT
SCREW CORT TI DBL LEAD 5X30 (Screw) ×2 IMPLANT
SCREW CORT TI DBL LEAD 5X42 (Screw) ×2 IMPLANT
SCREW CORT TI DBL LEAD 5X65 (Screw) ×2 IMPLANT
SOAP 2 % CHG 4 OZ (WOUND CARE) ×3 IMPLANT
SPLINT PLASTER CAST XFAST 5X30 (CAST SUPPLIES) IMPLANT
SPLINT PLASTER XFAST SET 5X30 (CAST SUPPLIES) ×2
SPONGE GAUZE 2X2 STER 10/PKG (GAUZE/BANDAGES/DRESSINGS) ×2
SPONGE LAP 18X18 X RAY DECT (DISPOSABLE) ×3 IMPLANT
SPONGE LAP 4X18 RFD (DISPOSABLE) ×3 IMPLANT
STAPLER VISISTAT 35W (STAPLE) IMPLANT
STRIP CLOSURE SKIN 1/2X4 (GAUZE/BANDAGES/DRESSINGS) ×2 IMPLANT
SUCTION FRAZIER HANDLE 10FR (MISCELLANEOUS) ×2
SUCTION TUBE FRAZIER 10FR DISP (MISCELLANEOUS) ×1 IMPLANT
SUT ETHILON 3 0 PS 1 (SUTURE) ×3 IMPLANT
SUT PROLENE 3 0 PS 2 (SUTURE) ×3 IMPLANT
SUT VIC AB 0 CT1 27 (SUTURE) ×3
SUT VIC AB 0 CT1 27XBRD ANBCTR (SUTURE) ×1 IMPLANT
SUT VIC AB 2-0 CT1 27 (SUTURE) ×6
SUT VIC AB 2-0 CT1 TAPERPNT 27 (SUTURE) ×2 IMPLANT
SUT VIC AB 3-0 PS2 18 (SUTURE) ×3
SUT VIC AB 3-0 PS2 18XBRD (SUTURE) ×1 IMPLANT
SYR CONTROL 10ML LL (SYRINGE) IMPLANT
TOWEL OR 17X24 6PK STRL BLUE (TOWEL DISPOSABLE) ×3 IMPLANT
TOWEL OR 17X26 10 PK STRL BLUE (TOWEL DISPOSABLE) ×3 IMPLANT
TUBE CONNECTING 12'X1/4 (SUCTIONS) ×1
TUBE CONNECTING 12X1/4 (SUCTIONS) ×2 IMPLANT
WASHER FLAT ACE (Orthopedic Implant) ×4 IMPLANT
WASHER PLAIN FLAT ACE NS 3PK (Orthopedic Implant) IMPLANT
WATER STERILE IRR 1000ML POUR (IV SOLUTION) ×3 IMPLANT

## 2018-12-23 NOTE — Progress Notes (Signed)
1200 Received pt from PACU, A&O x4. Right short leg splint with coban dry and intact. Right toes pink and warm but unable to move yet. Elevated RLE to pillows.

## 2018-12-23 NOTE — Anesthesia Postprocedure Evaluation (Signed)
Anesthesia Post Note  Patient: [name]  Procedure(s) Performed: right tibiotalar calcaneal arthrodesis (Right ) RIGHT ANKLE REMOVAL OF DEEP IMPLANTS (Right ) RIGHT MEDIAL MALLEOLUS OSTEOTOMY (Right )     Patient location during evaluation: PACU Anesthesia Type: Regional and General Level of consciousness: awake and alert Pain management: pain level controlled Vital Signs Assessment: post-procedure vital signs reviewed and stable Respiratory status: spontaneous breathing, nonlabored ventilation and respiratory function stable Cardiovascular status: blood pressure returned to baseline and stable Postop Assessment: no apparent nausea or vomiting Anesthetic complications: no    Last Vitals:  Vitals:   12/23/18 1133 12/23/18 1142  BP: 117/81 120/77  Pulse: 82 79  Resp: 20 14  Temp: 36.7 C   SpO2: 97% 97%    Last Pain:  Vitals:   12/23/18 1133  TempSrc:   PainSc: 0-No pain                 Toben Acuna,W. EDMOND

## 2018-12-23 NOTE — H&P (Signed)
[name] is an 48 y.o. female.   Chief Complaint: right ankle pain HPI:  48 y/o female with chronic right ankle pain due to post traumatic arthritis.  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  She presents now for treatment of the malunion and nonunion and arthrodesis of the ankle joint.  Past Medical History:  Diagnosis Date  . Chiari malformation type II (HCC)   . Family history of adverse reaction to anesthesia    grandfather died from anesthesia; grandfather had malignant hyperthermia  . GERD (gastroesophageal reflux disease)   . Hydrocephalus (HCC)   . Malignant hyperthermia   . Neuromuscular disorder (HCC)    Neuropathy  . Osteoarthritis   . PONV (postoperative nausea and vomiting)   . Spina bifida [facility])     Past Surgical History:  Procedure Laterality Date  . ANKLE ARTHROSCOPY Left    related to Tendon  . BACK SURGERY  1971   Spinal Bifida  . BACK SURGERY  2012   fusion  . EXTERNAL FIXATION LEG Right 05/18/2018   Procedure: EXTERNAL FIXATION LEG;  Surgeon: Yolonda Kida, MD;  Location[facility] OR;  Service: Orthopedics;  Laterality: Right;  . EXTERNAL FIXATION REMOVAL Right 06/14/2018   Procedure: Right ankle external fixator removal with steinman pin placement;  Surgeon: Yolonda Kida, MD;  Location: [facility] OR;  Service: Orthopedics;  Laterality: Right;  60 mins  . HARDWARE REMOVAL Right 05/18/2018   Procedure: HARDWARE REMOVAL;  Surgeon: Yolonda Kida, MD;  Location: [facility] OR;  Service: Orthopedics;  Laterality: Right;  . KNEE ARTHROSCOPY Left 1983  . ORIF ANKLE FRACTURE Right 04/23/2018   Procedure: OPEN REDUCTION INTERNAL FIXATION (ORIF) TRIMALLEOLAR ANKLE FRACTURE;  Surgeon: Yolonda Kida, MD;  Location[facility]S;  Service: Orthopedics;  Laterality: Right;  120 mins  . ORIF ANKLE FRACTURE Right 05/18/2018   Procedure: Right ankle open reduction internal fixation revision;  Surgeon: Yolonda Kida, MD;  Location: [facility] OR;  Service: Orthopedics;  Laterality: Right;  120 mins  . SHUNT EXTERNALIZATION     Head x 2  . WISDOM TOOTH EXTRACTION      Family History  Problem Relation Age of Onset  . Non-Hodgkin's lymphoma Mother   . Hypertension Mother   . Hypertension Father    Social History:  reports that she has never smoked. She has never used smokeless tobacco. She reports that she does not drink alcohol or use drugs.  Allergies:  Allergies  Allergen Reactions  . Anesthetics, Amide Other (See Comments)    Pt has Malignant Hyperthermia which is an allergy to general anesthesia  . Percocet [Oxycodone-Acetaminophen] Itching  . Gabapentin Other (See Comments)    Depression.  . Latex Rash  . Other Rash and Other (See Comments)    Latex Adhesive tape: prefers to use paper tape    Medications Prior to Admission  Medication Sig Dispense Refill  . DULoxetine (CYMBALTA) 30 MG capsule Take 1 capsule (30 mg total) by mouth at bedtime. 30 capsule 0  . ibuprofen (ADVIL,MOTRIN) 200 MG tablet Take 400 mg by mouth every 6 (six) hours as needed for headache or moderate pain.       Results for orders placed or performed during the hospital encounter of 12/23/18 (from the past 48 hour(s))  Pregnancy, urine POC     Status: None   Collection Time: 12/23/18  6:29 AM  Result Value Ref Range   Preg  Test, Ur NEGATIVE NEGATIVE    Comment:        THE SENSITIVITY OF THIS METHODOLOGY IS >24 mIU/mL    No results found.  ROS  No recent f/c/n/v/wt loss.  Blood pressure (!) 162/88, pulse 87, temperature 97.9 F (36.6 C), temperature source Oral, resp. rate 18, height 5' (1.524 m), weight 127 kg, SpO2 99 %. Physical Exam  wn wd overweight woman in nad.  A and ox 4.  Mood and affect normal.  EOMI.  resp unlabored.  R ankle with malalignment.  Incisions healed.  No signs of infection.  Decreased sens to LT at the foot.  No lymphadenopathy.  Brisk cap refill at the  toes.  Assessment/Plan R fibula nonunion, medial malleolus malunion and post traumatic arthritis of the ankle.  To OR for open treatment of the malunion and nonunion and arthrodesis of the ankle and subtalar joints.  The risks and benefits of the alternative treatment options have been discussed in detail.  The patient wishes to proceed with surgery and specifically understands risks of bleeding, infection, nerve damage, blood clots, need for additional surgery, amputation and death.   Toni Arthurs, MD 18-Jan-2019, 7:18 AM

## 2018-12-23 NOTE — Op Note (Signed)
12/23/2018  11:06 AM  PATIENT:  [name]  48 y.o. female  PRE-OPERATIVE DIAGNOSIS: 1.  Right fibula nonunion      2.  Right medial malleolus (tibial) malunion      3.  Post traumatic arthritis of the right ankle    POST-OPERATIVE DIAGNOSIS: Same  Procedure(s): 1.  Removal of deep implants from the medial tibia 2.  Removal of deep implants from the distal fibula through a separate incision 3.  Open treatment of fibular nonunion with internal fixation 4.  Open treatment of medial malleolar malunion with internal fixation 5.  Right ankle arthrodesis 6.  Right subtalar arthrodesis  SURGEON:  Toni Arthurs, MD  ASSISTANT: Alfredo Martinez, PA-C  ANESTHESIA:   General, regional  EBL:  minimal   TOURNIQUET:   Total Tourniquet Time Documented: Thigh (Right) - 120 minutes Total: Thigh (Right) - 120 minutes  COMPLICATIONS:  None apparent  DISPOSITION:  Extubated, awake and stable to recovery.  INDICATION FOR PROCEDURE: The patient is a 48 year old female with a past medical history significant for spina bifida and obesity.  She had a right trimalleolar ankle fracture complicated by malunion of the medial malleolus and nonunion of the fibula after operative treatment.  She has developed posttraumatic arthritis of the ankle and presents now for surgical treatment.  She has failed non-operative treatment to date.  The risks and benefits of the alternative treatment options have been discussed in detail.  The patient wishes to proceed with surgery and specifically understands risks of bleeding, infection, nerve damage, blood clots, need for additional surgery, amputation and death.  PROCEDURE IN DETAIL:  After pre operative consent was obtained, and the correct operative site was identified, the patient was brought to the operating room and placed supine on the OR table.  Anesthesia was administered.  Pre-operative antibiotics were administered.  A surgical timeout was taken.  The right  lower extremity was prepped and draped in standard sterile fashion with a tourniquet around the thigh.  The extremity was elevated and the tourniquet was inflated to 350 mmHg.  The previous incision medially was identified.  It was opened and extended proximally.  Dissection was carried down through the subcutaneous tissues to the medial tibia.  The medial hardware was identified.  Both washers were freed of surrounding soft tissues.  The suture was cut and the washers were removed without difficulty.  Attention was then turned to the lateral malleolus.  The previous incision was opened again sharply and dissection was carried down through the subcutaneous tissues to the superficial aspect of the plate.  Both washers were identified.  They were removed with a rondure removing all suture from the fibula and tibia.  The distal screws were then removed followed by the proximal screws.  The plate was removed without difficulty.  The nonunion site was then identified.  It was mobilized with a curette and osteotome.  The fibula was then released at the ATFL and CFL allowing it to be externally rotated exposing the lateral aspect of the ankle joint.  The oscillating saw was used to cut the medial cortex of the fibula away exposing cancellus bone.  The ankle joint was then opened with a lamina spreader.  All remaining articular cartilage and subchondral bone was removed with curettes and rondure.  The subtalar joint was then exposed taking care to protect the peroneal tendons.  The remaining cartilage in the posterior facet was removed along with subchondral bone.  Both ankle and subtalar joint were  irrigated copiously.  A bur was then used to perforate both sides of the joint leaving the resultant bone graft in place.  This was performed for both the ankle and subtalar joints.  The talus was then reduced beneath the tibia.  An AP radiograph showed that the medial malleolus nonunion block to the talus from being  reduced in line with the tibia.  Attention was returned to the medial incision.  Dissection was carried around the anterior aspect of the medial malleolus.  The medial gutter was cleared of all soft tissue and the remaining cartilage.  The fracture line was identified.  The fracture was noted to be completely healed.  An osteotome was used to break through the previous fracture site mobilizing the medial malleolus fragment with an osteotomy.  The talus was then reduced beneath the tibia.  AP and lateral radiographs confirmed appropriate reduction of the joint.  A guidepin for the [facility] nail was then inserted through the plantar fat pad of the heel.  It was advanced across the subtalar and ankle joints and into the medullary canal of the distal tibia.  Radiographs confirmed appropriate position of the guidepin.  Guidepin was then reamed to a diameter of 11 mm.  A 10 mm x 150 mm nail was selected.  It was advanced over the guidepin and seated to the appropriate depth.  Percutaneous technique was used to insert 2 proximal interlocking screws through the nail.  Another screw was placed from lateral to medial through the talus.  The compression screw was then tightened compressing the ankle joint appropriately.  The posterior to anterior screw was then placed percutaneously into the calcaneus.  The subtalar joint was compressed and the screw was locked.  The targeting jig was removed after confirming appropriate position and length of all hardware with AP and lateral radiographs.  The fibula was then reduced to the lateral talus and distal tibia as well as the distal fibula.  It was secured with 4 mm cannulated screws with washers.  The medial malleolus was then reduced and secured with 2 4 mm cannulated screws with washers.  Final AP and lateral radiographs confirmed appropriate position and length of all hardware and appropriate reduction of the ankle and subtalar joints.  Both wounds were then irrigated  copiously.  Subcutaneous tissues were approximated with Vicryl.  Skin incisions were closed with nylon.  Sterile dressings were applied followed by a well-padded short leg splint.  The tourniquet had been released at 2 hours.  Hemostasis was achieved prior to closure.  Vancomycin powder was sprinkled in the wounds on both sides of the ankle.  The patient was awakened from anesthesia and transported to the recovery room in stable condition.   FOLLOW UP PLAN: The patient will be observed overnight for pain control and for physical therapy postop day 1.  She will start aspirin for DVT prophylaxis.  Follow-up with me in the office in 2 weeks for suture removal and conversion to a short leg cast.:    Alfredo Martinez PA-C was present and scrubbed for the duration of the operative case. His assistance was essential in positioning the patient, prepping and draping, gaining and maintaining exposure, performing the operation, closing and dressing the wounds and applying the splint.

## 2018-12-23 NOTE — Transfer of Care (Signed)
Immediate Anesthesia Transfer of Care Note  Patient: [name]  Procedure(s) Performed: right tibiotalar calcaneal arthrodesis (Right ) RIGHT ANKLE REMOVAL OF DEEP IMPLANTS (Right ) RIGHT MEDIAL MALLEOLUS OSTEOTOMY (Right )  Patient Location: PACU  Anesthesia Type:General and Regional  Level of Consciousness: awake, oriented and patient cooperative  Airway & Oxygen Therapy: Patient Spontanous Breathing and Patient connected to face mask oxygen  Post-op Assessment: Report given to RN and Post -op Vital signs reviewed and stable  Post vital signs: Reviewed and stable  Last Vitals:  Vitals Value Taken Time  BP 120/68 12/23/2018 11:04 AM  Temp 36.5 C 12/23/2018 11:04 AM  Pulse 91 12/23/2018 11:04 AM  Resp 20 12/23/2018 11:04 AM  SpO2 97 % 12/23/2018 11:04 AM  Vitals shown include unvalidated device data.  Last Pain:  Vitals:   12/23/18 1104  TempSrc:   PainSc: (P) 0-No pain      Patients Stated Pain Goal: 1 (12/23/18 0617)  Complications: No apparent anesthesia complications

## 2018-12-23 NOTE — Anesthesia Procedure Notes (Signed)
Anesthesia Regional Block: Popliteal block   Pre-Anesthetic Checklist: ,, timeout performed, Correct Patient, Correct Site, Correct Laterality, Correct Procedure, Correct Position, site marked, Risks and benefits discussed, pre-op evaluation,  At surgeon's request and post-op pain management  Laterality: Right  Prep: Maximum Sterile Barrier Precautions used, chloraprep       Needles:  Injection technique: Single-shot  Needle Type: Echogenic Stimulator Needle     Needle Length: 9cm  Needle Gauge: 21     Additional Needles:   Procedures:, nerve stimulator,,, ultrasound used (permanent image in chart),,,,   Nerve Stimulator or Paresthesia:  Response: Tibial, 0.4 mA,   Additional Responses:   Narrative:  Start time: 12/23/2018 7:10 AM End time: 12/23/2018 7:25 AM Injection made incrementally with aspirations every 5 mL. Anesthesiologist: Gaynelle Adu, MD  Additional Notes: 2% Lidocaine skin wheel. Saphenous block with 10cc of 0.5% Bupivicaine plain.

## 2018-12-23 NOTE — Anesthesia Procedure Notes (Signed)
Procedure Name: Intubation Date/Time: 12/23/2018 7:48 AM Performed by: Julian Reil, CRNA Pre-anesthesia Checklist: Patient identified, Emergency Drugs available, Suction available and Patient being monitored Patient Re-evaluated:Patient Re-evaluated prior to induction Oxygen Delivery Method: Circle system utilized Preoxygenation: Pre-oxygenation with 100% oxygen Induction Type: IV induction Ventilation: Mask ventilation without difficulty Laryngoscope Size: Miller and 3 Grade View: Grade I Tube type: Oral Tube size: 7.0 mm Number of attempts: 1 Airway Equipment and Method: Stylet Placement Confirmation: ETT inserted through vocal cords under direct vision,  positive ETCO2 and breath sounds checked- equal and bilateral Secured at: 23 cm Tube secured with: Tape Dental Injury: Teeth and Oropharynx as per pre-operative assessment  Comments: 4x4s bite block used.

## 2018-12-23 NOTE — Progress Notes (Signed)
1935 Pt c/o new numbness to both arms, mostly to the right hand. Assisted pt to reposition in bed. Pt also c/o mild mid "chest soreness" per pt's own words. V/S checked. Called to [facility] office, Dr Aundria Rud is on call, left a message to the operator.

## 2018-12-23 NOTE — Discharge Instructions (Signed)
[name], MD °[facility] ° °Please read the following information regarding your care after surgery. ° °Medications  °You only need a prescription for the narcotic pain medicine (ex. oxycodone, Percocet, Norco).  All of the other medicines listed below are available over the counter. °X Aleve 2 pills twice a day for the first 3 days after surgery. °X acetominophen (Tylenol) 650 mg every 4-6 hours as you need for minor to moderate pain °X oxycodone as prescribed for severe pain ° °Narcotic pain medicine (ex. oxycodone, Percocet, Vicodin) will cause constipation.  To prevent this problem, take the following medicines while you are taking any pain medicine. °X docusate sodium (Colace) 100 mg twice a day X senna (Senokot) 2 tablets twice a day ° °X To help prevent blood clots, take a baby aspirin (81 mg) twice a day after surgery.  You should also get up every hour while you are awake to move around.   ° °Weight Bearing °X Do not bear any weight on the operated leg or foot. ° °Cast / Splint / Dressing °X Keep your splint, cast or dressing clean and dry.  Don’t put anything (coat hanger, pencil, etc) down inside of it.  If it gets damp, use a hair dryer on the cool setting to dry it.  If it gets soaked, call the office to schedule an appointment for a cast change. °  ° °After your dressing, cast or splint is removed; you may shower, but do not soak or scrub the wound.  Allow the water to run over it, and then gently pat it dry. ° °Swelling °It is normal for you to have swelling where you had surgery.  To reduce swelling and pain, keep your toes above your nose for at least 3 days after surgery.  It may be necessary to keep your foot or leg elevated for several weeks.  If it hurts, it should be elevated. ° °Follow Up °Call my office at [phone] when you are discharged from the hospital or surgery center to schedule an appointment to be seen two weeks after surgery. ° °Call my office at [phone] if  you develop a fever >101.5° F, nausea, vomiting, bleeding from the surgical site or severe pain.   ° ° °

## 2018-12-24 ENCOUNTER — Inpatient Hospital Stay (HOSPITAL_COMMUNITY): Payer: No Typology Code available for payment source

## 2018-12-24 DIAGNOSIS — M19171 Post-traumatic osteoarthritis, right ankle and foot: Secondary | ICD-10-CM

## 2018-12-24 DIAGNOSIS — R072 Precordial pain: Secondary | ICD-10-CM

## 2018-12-24 LAB — ECHOCARDIOGRAM COMPLETE
Height: 60 in
Weight: 4480 oz

## 2018-12-24 LAB — TROPONIN I: Troponin I: <0.03 ng/mL (ref <0.03)

## 2018-12-24 LAB — D-DIMER, QUANTITATIVE: D-Dimer, Quant: 1.05 ug/mL-FEU — ABNORMAL HIGH (ref 0.00–0.50)

## 2018-12-24 MED ORDER — SENNA 8.6 MG PO TABS: 2.0000 | ORAL_TABLET | Freq: Two times a day (BID) | ORAL | 0 refills | Status: DC

## 2018-12-24 MED ORDER — IOPAMIDOL (ISOVUE-370) INJECTION 76%
75.0000 mL | Freq: Once | INTRAVENOUS | Status: AC | PRN
  Administered 2018-12-24: 75 mL via INTRAVENOUS

## 2018-12-24 MED ORDER — DOCUSATE SODIUM 100 MG PO CAPS: 100.0000 mg | ORAL_CAPSULE | Freq: Two times a day (BID) | ORAL | 0 refills | Status: DC

## 2018-12-24 MED ORDER — OXYCODONE HCL 5 MG PO TABS: 5.0000 mg | ORAL_TABLET | ORAL | 0 refills | Status: AC | PRN

## 2018-12-24 MED ORDER — IOPAMIDOL (ISOVUE-370) INJECTION 76%
INTRAVENOUS | Status: AC
  Filled 2018-12-24: qty 100

## 2018-12-24 MED ORDER — ASPIRIN EC 81 MG PO TBEC: 81.0000 mg | DELAYED_RELEASE_TABLET | Freq: Two times a day (BID) | ORAL | 0 refills | Status: DC

## 2018-12-24 NOTE — Progress Notes (Signed)
Subjective: 1 Day Post-Op Procedure(s) (LRB): right tibiotalar calcaneal arthrodesis (Right) RIGHT ANKLE REMOVAL OF DEEP IMPLANTS (Right) RIGHT MEDIAL MALLEOLUS OSTEOTOMY (Right)  Patient reports pain as moderate.  Reports that she had intermittent CP and tightness last night with numbness into bilateral UEs.  States that she has a hx of pericarditis 25 years ago during pregnancy.  Denies any hx of CP or pericarditis since then.  EKG demonstrates sinus rhythm.  Tolerating POs well.  Admits to flatus.  Objective:   VITALS:  Temp:  [97.7 F (36.5 C)-98.7 F (37.1 C)] 98 F (36.7 C) (01/24 0548) Pulse Rate:  [77-111] 77 (01/24 0548) Resp:  [14-20] 16 (01/24 0548) BP: (117-132)/(61-81) 117/67 (01/24 0548) SpO2:  [94 %-98 %] 96 % (01/24 0548)  General: WDWN patient in NAD. Psych:  Appropriate mood and affect. Neuro:  A&O x 3, Moving all extremities, sensation subjectively diminished to light touch (likely due to nerve block and underlying neuropathy.) HEENT:  EOMs intact Chest:  Even non-labored respirations Skin:  Dressing C/D/I, no rashes or lesions Extremities: warm/dry, no visible edema, erythema or echymosis.  No lymphadenopathy. Pulses: Popliteus 2+ MSK:  ROM: EHL/FHL intact, MMT: able to perform quad set    LABS Recent Labs    12/23/18 1304  HGB 13.0  WBC 18.6*  PLT 324   Recent Labs    12/23/18 1304  CREATININE 0.99   No results for input(s): LABPT, INR in the last 72 hours.   Assessment/Plan: 1 Day Post-Op Procedure(s) (LRB): right tibiotalar calcaneal arthrodesis (Right) RIGHT ANKLE REMOVAL OF DEEP IMPLANTS (Right) RIGHT MEDIAL MALLEOLUS OSTEOTOMY (Right)  NWB R LE Up with therapy Hospitalist consulted for eval of chest tightness and bilateral UE numbness. If cleared by hospitalist D/C Sat vs Sun. ASA for DVT prophylaxis upon D/C. D/C scripts sent to patient's pharmacy (oxycodone, ASA, and bowel regime). Plan for 2 week outpatient post-op visit with Dr.  Victorino Dike.  Alfredo Martinez PA-C [facility] Office:  [phone]

## 2018-12-24 NOTE — Consult Note (Signed)
[facility] Medical Consultation  Renee [name] L [name] WGN:[id number] DOB: 1971/09/26 DOA: 12/23/2018 PCP: Patient, No Pcp Per   Requesting physician: Toni ArthursJohn Hewitt MD Date of consultation: 12/24/2018 Reason for consultation: Chest pain.  Impression/Recommendations Active Problems:   Post-traumatic arthritis of ankle, right    1. Chest pain: Probably atypical chest pain.  Patient with no EKG changes.  Currently no ongoing chest pain.  No history of coronary artery disease.  She has already received aspirin for DVT prophylaxis.  Will check 2D echocardiogram with history of pericarditis and pericardial effusion.  Will check troponin.  Patient is largely immobile with recurrent ankle fractures, does not have any shortness of breath or tachycardia, will check d-dimer and if elevated, check CTA of the chest.  Continue monitoring at current level of care.  2. Posttraumatic arthritis of the ankle: Status post right ankle stabilization.  Postop management as per surgery.  Patient is on Lovenox per DVT prophylaxis.  She is on adequate pain medications.    I will followup again tomorrow. Please contact me if I can be of assistance in the meanwhile. Thank you for this consultation.  Chief Complaint: Chest pain.  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  She underwent surgery on 12/23/2018.  Patient does have history of pericarditis during her pregnancy 25 years ago when she had similar chest discomfort and was treated with anti-inflammatory as per patient.  Patient denies any recent history of viral illness, cough cold or influenza-like symptoms.  Denies any over-the-counter medication use.  She uses scooter to get around.  Denies any calf pain.  Denies any swelling or redness of  the calf.  Denies any shortness of breath or palpitations. Last night, After coming from recovery room, about 9 PM patient started having dull pain on her mid sternum, about 4 out of 10, no relation to deep breathing or movement, also felt numbness around both shoulder and felt like pain going around both shoulder, no shortness of breath, no nausea no vomiting.  An EKG done at that time was reviewed and is essentially normal.  Patient is very anxious about her history of pericarditis and also about blood clots.  Currently chest pain-free.   Review of Systems:  As per HPI, otherwise 10 review of systems are negative.  Past Medical History:  Diagnosis Date  . Chiari malformation type II (HCC)   . Family history of adverse reaction to anesthesia    grandfather died from anesthesia; grandfather had malignant hyperthermia  . GERD (gastroesophageal reflux disease)   . Hydrocephalus (HCC)   . Malignant hyperthermia   . Neuromuscular disorder (HCC)    Neuropathy  . Osteoarthritis   . PONV (postoperative nausea and vomiting)   . Spina bifida [facility](HCC)    Past Surgical History:  Procedure Laterality Date  . ANKLE ARTHROSCOPY Left    related to Tendon  . ANKLE SURGERY Right 12/23/2018  . BACK SURGERY  1971   Spinal Bifida  . BACK SURGERY  2012   fusion  . EXTERNAL FIXATION LEG Right 05/18/2018   Procedure: EXTERNAL FIXATION LEG;  Surgeon: Yolonda Kidaogers, Jason Patrick, MD;  [facility];  Service: Orthopedics;  Laterality: Right;  . EXTERNAL FIXATION REMOVAL Right 06/14/2018   Procedure: Right ankle external fixator removal with steinman pin placement;  Surgeon: Yolonda Kidaogers, Jason Patrick, MD;  Location: [facility] OR;  Service: Orthopedics;  Laterality: Right;  60 mins  . HARDWARE REMOVAL Right 05/18/2018   Procedure: HARDWARE REMOVAL;  Surgeon: Yolonda Kida, MD;  Location: [facility] OR;  Service: Orthopedics;  Laterality: Right;  . KNEE ARTHROSCOPY Left 1983  . ORIF ANKLE FRACTURE Right 04/23/2018   Procedure:  OPEN REDUCTION INTERNAL FIXATION (ORIF) TRIMALLEOLAR ANKLE FRACTURE;  Surgeon: Yolonda Kida, MD;  Location: [facility] ORS;  Service: Orthopedics;  Laterality: Right;  120 mins  . ORIF ANKLE FRACTURE Right 05/18/2018   Procedure: Right ankle open reduction internal fixation revision;  Surgeon: Yolonda Kida, MD;  Location: [facility] OR;  Service: Orthopedics;  Laterality: Right;  120 mins  . SHUNT EXTERNALIZATION     Head x 2  . WISDOM TOOTH EXTRACTION     Social History:  reports that she has never smoked. She has never used smokeless tobacco. She reports that she does not drink alcohol or use drugs.  Allergies  Allergen Reactions  . Anesthetics, Amide Other (See Comments)    Pt has Malignant Hyperthermia which is an allergy to general anesthesia  . Percocet [Oxycodone-Acetaminophen] Itching  . Gabapentin Other (See Comments)    Depression.  . Latex Rash  . Other Rash and Other (See Comments)    Latex Adhesive tape: prefers to use paper tape   Family History  Problem Relation Age of Onset  . Non-Hodgkin's lymphoma Mother   . Hypertension Mother   . Hypertension Father     Prior to Admission medications   Medication Sig Start Date End Date Taking? Authorizing Provider  DULoxetine (CYMBALTA) 30 MG capsule Take 1 capsule (30 mg total) by mouth at bedtime. 05/12/18  Yes Love, Evlyn Kanner, PA-C  ibuprofen (ADVIL,MOTRIN) 200 MG tablet Take 400 mg by mouth every 6 (six) hours as needed for headache or moderate pain.    Yes [provider]  aspirin EC 81 MG tablet Take 1 tablet (81 mg total) by mouth 2 (two) times daily. 12/24/18   Jacinta Shoe, PA-C  docusate sodium (COLACE) 100 MG capsule Take 1 capsule (100 mg total) by mouth 2 (two) times daily. While taking narcotic pain medicine. 12/24/18   Jacinta Shoe, PA-C  oxyCODONE (ROXICODONE) 5 MG immediate release tablet Take 1 tablet (5 mg total) by mouth every 4 (four) hours as needed for up to 5 days for moderate pain or  severe pain. 12/24/18 12/29/18  Jacinta Shoe, PA-C  senna (SENOKOT) 8.6 MG TABS tablet Take 2 tablets (17.2 mg total) by mouth 2 (two) times daily. 12/24/18   Jacinta Shoe, PA-C   Physical Exam: Blood pressure 117/67, pulse 77, temperature 98 F (36.7 C), temperature source Oral, resp. rate 16, height 5' (1.524 m), weight 127 kg, SpO2 96 %. Vitals:   12/24/18 0026 12/24/18 0548  BP: 121/71 117/67  Pulse: 77 77  Resp: 18 16  Temp: 98.3 F (36.8 C) 98 F (36.7 C)  SpO2: 98% 96%     General: Well-built.  She is on room air.  Comfortable.  Eyes: PERRLA, extraocular movements are normal.  Visual field is normal.  ENT: Oropharynx normal.  No exudates.  Neck: Supple, nontender, no lymphadenopathy.  Cardiovascular: S1-S2 normal, no murmurs, no JVD, no peripheral edema.  Respiratory: Bilateral equal airway sounds, no added sounds.  Abdomen: Soft nontender, bowel sounds present, no hepatosplenomegaly.  Skin: Intact.  Musculoskeletal: Joints are nontender.  Right ankle on below knee cast and immobilized.  Homans sign negative.  Psychiatric: Normal mood and affect.  Alert oriented x3.  Neurologic: Cranial nerves II through XII normal.  Motor and sensory and DTRs are normal.  Labs on Admission:  Basic Metabolic Panel: Recent Labs  Lab 12/23/18 1304  CREATININE 0.99   Liver Function Tests: No results for input(s): AST, ALT, ALKPHOS, BILITOT, PROT, ALBUMIN in the last 168 hours. No results for input(s): LIPASE, AMYLASE in the last 168 hours. No results for input(s): AMMONIA in the last 168 hours. CBC: Recent Labs  Lab 12/23/18 1304  WBC 18.6*  HGB 13.0  HCT 39.5  MCV 91.4  PLT 324   Cardiac Enzymes: No results for input(s): CKTOTAL, CKMB, CKMBINDEX, TROPONINI in the last 168 hours. BNP: Invalid input(s): POCBNP CBG: No results for input(s): GLUCAP in the last 168 hours.  Radiological Exams on Admission: Dg Ankle 2 Views Right  Result Date:  12/23/2018 CLINICAL DATA:  RIGHT tibiotalocalcaneal arthrodesis EXAM: RIGHT ANKLE - 2 VIEW; DG C-ARM 61-120 MIN COMPARISON:  06/14/2018 FLUOROSCOPY TIME:  1 minutes 11 seconds Dose: Not provided Images: 3 FINDINGS: IM nail with 3 screws identified at the distal RIGHT tibia, traversing the talus and calcaneus. Four additional screws are present, 2 medial collateral at the medial malleolus and 2 lateral to medial syndesmotic screws. Bones demineralized. IMPRESSION: Post RIGHT ankle fusion as above. Electronically Signed   By: Ulyses SouthwardMark  Boles M.D.   On: 12/23/2018 10:57   Dg C-arm 1-60 Min  Result Date: 12/23/2018 CLINICAL DATA:  RIGHT tibiotalocalcaneal arthrodesis EXAM: RIGHT ANKLE - 2 VIEW; DG C-ARM 61-120 MIN COMPARISON:  06/14/2018 FLUOROSCOPY TIME:  1 minutes 11 seconds Dose: Not provided Images: 3 FINDINGS: IM nail with 3 screws identified at the distal RIGHT tibia, traversing the talus and calcaneus. Four additional screws are present, 2 medial collateral at the medial malleolus and 2 lateral to medial syndesmotic screws. Bones demineralized. IMPRESSION: Post RIGHT ankle fusion as above. Electronically Signed   By: Ulyses SouthwardMark  Boles M.D.   On: 12/23/2018 10:57    EKG: Independently reviewed.  Normal sinus rhythm without any acute ST-T wave changes.  Time spent: 42 minutes.  Lyndel SafeKuber Darcel Frane [facility]   If 7PM-7AM, please contact night-coverage [email or web address] Password [email or web address] 12/24/2018, 9:51 AM

## 2018-12-24 NOTE — Progress Notes (Addendum)
Received call back from Dr. Aundria Rud and told him about patient complaining of numbness in hands and dull pain in mid chest. EKG has been done and shows sinus rhythm. Patient does not complain of any shortness of breath at the time and vital signs are WNL. She states that she has neuropathy and that the chest pain with throat soreness may be coming from irritation from surgery. She was given an ice pop and lozenges which seem to help some. Patient is up talking with me and shows no signs of distress & numbness is gone. Will continue to monitor.

## 2018-12-24 NOTE — Evaluation (Signed)
Physical Therapy Evaluation Patient Details Name: [name] MRN: [id number] DOB: 21-Mar-1971 Today's Date: 12/24/2018   History of Present Illness  48-year-old female with significant history of hydrocephalus, spina bifida, Chiari malformation type II and chronic immobility. She fell at work May 2019 and sustained a complex traumatic fracture of the right ankle. She underwent ORIF 04/23/18.  Due to non-union, pt underwent hardware removal and R ankle arthrodesis 12/23/18.     Clinical Impression  Pt admitted with above diagnosis. Pt currently with functional limitations due to the deficits listed below (see PT Problem List). On eval, pt required min guard to min assist bed mobility and min guard assist squat-pivot transfers. Pt able to maintain NWB RLE. Pt will benefit from skilled PT to increase their independence and safety with mobility to allow discharge to the venue listed below.  Pt very close to her baseline. She has been NWB RLE since her initial sx May 2019. PT to follow acutely. No follow up services indicated.     Follow Up Recommendations No PT follow up;Supervision - Intermittent    Equipment Recommendations  None recommended by PT    Recommendations for Other Services       Precautions / Restrictions Precautions Precautions: Fall Restrictions RLE Weight Bearing: Non weight bearing      Mobility  Bed Mobility Overal bed mobility: Needs Assistance Bed Mobility: Supine to Sit;Sit to Supine     Supine to sit: Min guard;HOB elevated Sit to supine: Min assist;HOB elevated   General bed mobility comments: +rail, increased time and effort, assist with RLE into bed  Transfers Overall transfer level: Needs assistance Equipment used: None Transfers: Squat Pivot Transfers     Squat pivot transfers: Min guard     General transfer comment: min guard for safety  Ambulation/Gait             General Gait Details: nonambulatory  Stairs             Wheelchair Mobility    Modified Rankin (Stroke Patients Only)       Balance Overall balance assessment: Needs assistance Sitting-balance support: No upper extremity supported;Feet supported Sitting balance-Leahy Scale: Good     Standing balance support: Bilateral upper extremity supported;During functional activity Standing balance-Leahy Scale: Poor Standing balance comment: reliant on BUE support                             Pertinent Vitals/Pain Pain Assessment: Faces Faces Pain Scale: Hurts even more Pain Location: RLE during mobility Pain Descriptors / Indicators: Throbbing;Grimacing Pain Intervention(s): Monitored during session;Repositioned    Home Living Family/patient expects to be discharged to:: Private residence Living Arrangements: Spouse/significant other;Children Available Help at Discharge: Family;Available 24 hours/day Type of Home: House Home Access: Ramped entrance     Home Layout: One level Home Equipment: Walker - 2 wheels;Electric scooter;Bedside commode;Grab bars - tub/shower;Grab bars - toilet      Prior Function Level of Independence: Independent with assistive device(s)         Comments: Sleeps in recliner. Mod I transfers to [facility] and electric scooter. Nonambulatory     Hand Dominance        Extremity/Trunk Assessment   Upper Extremity Assessment Upper Extremity Assessment: Overall WFL for tasks assessed    Lower Extremity Assessment Lower Extremity Assessment: RLE deficits/detail RLE Deficits / Details: short leg cast    Cervical / Trunk Assessment Cervical / Trunk Assessment: Other exceptions Cervical /  Trunk Exceptions: h/o spina bifada   Communication   Communication: No difficulties  Cognition Arousal/Alertness: Awake/alert Behavior During Therapy: WFL for tasks assessed/performed Overall Cognitive Status: Within Functional Limits for tasks assessed                                         General Comments      Exercises     Assessment/Plan    PT Assessment Patient needs continued PT services  PT Problem List Decreased balance;Decreased mobility;Obesity;Pain;Decreased activity tolerance       PT Treatment Interventions Functional mobility training;Balance training;Patient/family education;Therapeutic activities;Therapeutic exercise    PT Goals (Current goals can be found in the Care Plan section)  Acute Rehab PT Goals Patient Stated Goal: home PT Goal Formulation: With patient Time For Goal Achievement: 12/31/18 Potential to Achieve Goals: Good    Frequency Min 3X/week   Barriers to discharge        Co-evaluation               AM-PAC PT "6 Clicks" Mobility  Outcome Measure Help needed turning from your back to your side while in a flat bed without using bedrails?: A Little Help needed moving from lying on your back to sitting on the side of a flat bed without using bedrails?: A Little Help needed moving to and from a bed to a chair (including a wheelchair)?: A Little Help needed standing up from a chair using your arms (e.g., wheelchair or bedside chair)?: A Little Help needed to walk in hospital room?: Total Help needed climbing 3-5 steps with a railing? : Total 6 Click Score: 14    End of Session   Activity Tolerance: Patient tolerated treatment well Patient left: in bed;with call bell/phone within reach Nurse Communication: Mobility status PT Visit Diagnosis: Other abnormalities of gait and mobility (R26.89);Pain Pain - Right/Left: Right Pain - part of body: Ankle and joints of foot    Time: 3810-1751 PT Time Calculation (min) (ACUTE ONLY): 16 min   Charges:   PT Evaluation $PT Eval Moderate Complexity: 1 Mod          Aida Raider, PT  Office # [phone] Pager [phone]   Ilda Foil 12/24/2018, 1:07 PM

## 2018-12-24 NOTE — Progress Notes (Signed)
  Echocardiogram 2D Echocardiogram has been performed.  [name] 12/24/2018, 5:23 PM

## 2018-12-25 MED ORDER — HYDROMORPHONE HCL 2 MG PO TABS
2.0000 mg | ORAL_TABLET | Freq: Four times a day (QID) | ORAL | Status: DC | PRN
  Administered 2018-12-25 – 2018-12-26 (×5): 2 mg via ORAL
  Filled 2018-12-25 (×5): qty 1

## 2018-12-25 NOTE — Progress Notes (Signed)
   Subjective: 2 Days Post-Op Procedure(s) (LRB): right tibiotalar calcaneal arthrodesis (Right) RIGHT ANKLE REMOVAL OF DEEP IMPLANTS (Right) RIGHT MEDIAL MALLEOLUS OSTEOTOMY (Right)  Pt c/o moderate pain and numbness in the right lower extremity Pain medication is causing her to itch Denies any other new symptoms Patient reports pain as moderate.  Objective:   VITALS:   Vitals:   12/24/18 2049 12/25/18 0539  BP: 119/69 121/70  Pulse: 92 80  Resp: 16 16  Temp: 98.2 F (36.8 C) 98.6 F (37 C)  SpO2: 97% 98%    Right ankle: dressing intact Decreased sensation to her toes Normal sensation to right knee Currently immobilized  LABS Recent Labs    12/23/18 1304  HGB 13.0  HCT 39.5  WBC 18.6*  PLT 324    Recent Labs    12/23/18 1304  CREATININE 0.99     Assessment/Plan: 2 Days Post-Op Procedure(s) (LRB): right tibiotalar calcaneal arthrodesis (Right) RIGHT ANKLE REMOVAL OF DEEP IMPLANTS (Right) RIGHT MEDIAL MALLEOLUS OSTEOTOMY (Right) Will continue to monitor her progress Appreciate medical team involvement Continue non weight bearing right lower extermity     Elizebeth Koller, MPAS [facility] is now [facility]  [place] [address], [place], [place] [address] Phone: [phone] [email or web address] Facebook  [employer]

## 2018-12-25 NOTE — Progress Notes (Signed)
PROGRESS NOTE  [name]  VFI:[id number] DOB: [birth year]  DOA: 12/23/2018 PCP: Patient, No Pcp Per   Brief Narrative: 48 year old female with significant history of hydrocephalus, spina bifida, Chiari malformation type II and chronic immobility who had suffered from complex traumatic fracture of the right ankle, s/p   multiple staged surgical procedure for stabilization.  Hospitalist asked to see patient on account of sternal dull 4/10 chest pain during time in recovery room post-op.  No nausea, vomiting, diaphoresis.  Twelve-lead EKG done at time of chest pain was essentially normal.  She has history of pericarditis about 25 years ago during pregnancy and was said to be very anxious about this time.  Assessment & Plan:   Active Problems:   Post-traumatic arthritis of ankle, right  Chest pain Twelve-lead EKG unremarkable at time of chest pain Echo 12/24/2018 - EF 55-60%, unremarkable CTA 12/24/2018 neg for PE, Moderate gastric hiatal hernia - out patient follow -up Likely noncardiac  Leukocytosis No evidence of infectious etiology Possible stress margination Follow clinically     Subjective: Resting comfortably, chest pain-free, no dizziness no shortness of breath Objective:  Vitals:   12/24/18 0548 12/24/18 1407 12/24/18 2049 12/25/18 0539  BP: 117/67 120/66 119/69 121/70  Pulse: 77 85 92 80  Resp: 16 16 16 16   Temp: 98 F (36.7 C) 98.2 F (36.8 C) 98.2 F (36.8 C) 98.6 F (37 C)  TempSrc: Oral Oral Oral Oral  SpO2: 96% 96% 97% 98%  Weight:      Height:        Intake/Output Summary (Last 24 hours) at 12/25/2018 1155 Last data filed at 12/25/2018 0800 Gross per 24 hour  Intake 600 ml  Output 950 ml  Net -350 ml   Filed Weights   12/23/18 0612  Weight: 127 kg    Examination:  General exam: No acute distress Respiratory system: Clear to auscultation. Respiratory effort normal. Cardiovascular system: S1 & S2 heard, RRR. No JVD, murmurs, rubs, gallops or  clicks. No pedal edema. Gastrointestinal system: Abdomen is nondistended, soft and nontender. No organomegaly or masses felt. Normal bowel sounds heard. Central nervous system: Alert and oriented. No focal neurological deficits. Extremities: Symmetric 5 x 5 power. Skin: No rashes, lesions or ulcers Psychiatry: Judgement and insight appear normal. Mood & affect appropriate.     Data Reviewed: I have personally reviewed following labs and imaging studies  CBC: Recent Labs  Lab 12/23/18 1304  WBC 18.6*  HGB 13.0  HCT 39.5  MCV 91.4  PLT 324   Basic Metabolic Panel: Recent Labs  Lab 12/23/18 1304  CREATININE 0.99   GFR: Estimated Creatinine Clearance: 85.7 mL/min (by C-G formula based on SCr of 0.99 mg/dL). Liver Function Tests: No results for input(s): AST, ALT, ALKPHOS, BILITOT, PROT, ALBUMIN in the last 168 hours. No results for input(s): LIPASE, AMYLASE in the last 168 hours. No results for input(s): AMMONIA in the last 168 hours. Coagulation Profile: No results for input(s): INR, PROTIME in the last 168 hours. Cardiac Enzymes: Recent Labs  Lab 12/24/18 1026  TROPONINI <0.03   BNP (last 3 results) No results for input(s): PROBNP in the last 8760 hours. HbA1C: No results for input(s): HGBA1C in the last 72 hours. CBG: No results for input(s): GLUCAP in the last 168 hours. Lipid Profile: No results for input(s): CHOL, HDL, LDLCALC, TRIG, CHOLHDL, LDLDIRECT in the last 72 hours. Thyroid Function Tests: No results for input(s): TSH, T4TOTAL, FREET4, T3FREE, THYROIDAB in the last 72 hours. Anemia  Panel: No results for input(s): VITAMINB12, FOLATE, FERRITIN, TIBC, IRON, RETICCTPCT in the last 72 hours.  Sepsis Labs: Recent Labs  Lab 12/23/18 1304  WBC 18.6*    No results found for this or any previous visit (from the past 240 hour(s)).       Radiology Studies: Ct Angio Chest Pe W Or Wo Contrast  Result Date: 12/24/2018 CLINICAL DATA:  48 year old female  with shortness of breath, chest pain, arm numbness. Recent right ankle surgery. EXAM: CT ANGIOGRAPHY CHEST WITH CONTRAST TECHNIQUE: Multidetector CT imaging of the chest was performed using the standard protocol during bolus administration of intravenous contrast. Multiplanar CT image reconstructions and MIPs were obtained to evaluate the vascular anatomy. CONTRAST:  75mL ISOVUE-370 IOPAMIDOL (ISOVUE-370) INJECTION 76% COMPARISON:  Chest radiograph 08/03/2005. FINDINGS: Cardiovascular: Good contrast bolus timing in the pulmonary arterial tree. Mild respiratory motion. No focal filling defect identified in the pulmonary arteries to suggest acute pulmonary embolism. Cardiac size at the upper limits of normal to mildly increased. No pericardial effusion. Little contrast in the thoracic aorta which appears grossly normal. Mediastinum/Nodes: No mediastinal lymphadenopathy. Moderate gastric hiatal hernia. Lungs/Pleura: Major airways are patent. No pleural effusion or abnormal pulmonary opacity; there is mild left lower lobe atelectasis associated with the gastric hernia. Upper Abdomen: Negative visible liver, gallbladder, spleen, pancreas, adrenal glands, kidneys, and bowel in the upper abdomen. Musculoskeletal: Lower thoracic through upper lumbar posterior spinal fusion hardware. Evidence of solid posterior element arthrodesis. T10-T11 interbody ankylosis. No acute osseous abnormality identified. There is a small subcutaneous catheter in the ventral right chest and visible upper abdomen of unclear etiology. Review of the MIP images confirms the above findings. IMPRESSION: 1. No evidence of acute pulmonary embolus. 2. No acute findings in the chest. 3. Moderate gastric hiatal hernia. Electronically Signed   By: Odessa FlemingH  Hall M.D.   On: 12/24/2018 15:16        Scheduled Meds: . docusate sodium  100 mg Oral BID  . DULoxetine  30 mg Oral QHS  . enoxaparin (LOVENOX) injection  40 mg Subcutaneous Q24H  . senna  1 tablet  Oral BID   Continuous Infusions: . sodium chloride Stopped (12/24/18 1031)  . methocarbamol (ROBAXIN) IV       LOS: 2 days    Time spent: 4235    Jackie PlumGeorge Osei-Bonsu, MD [facility] Pager [phone]  If 7PM-7AM, please contact night-coverage [email or web address] Password [email or web address] 12/25/2018, 11:55 AM

## 2018-12-25 NOTE — Progress Notes (Addendum)
Physical Therapy Treatment Patient Details Name: [name] MRN: [id number] DOB: [birth year] Today's Date: 12/25/2018    History of Present Illness 48 year old female with significant history of hydrocephalus, spina bifida, Chiari malformation type II and chronic immobility. She fell at work May 2019 and sustained a complex traumatic fracture of the right ankle. She underwent ORIF 04/23/18.  Due to non-union, pt underwent hardware removal and R ankle arthrodesis 12/23/18.     PT Comments    Tolerated treatment well, focused on transfer with min guard assist and cues for technique/safety and chair placement. Still complains of some numbness in toes but getting some sensation in hallux. Feeling more confident with transfers.   Follow Up Recommendations  No PT follow up;Supervision - Intermittent     Equipment Recommendations  None recommended by PT    Recommendations for Other Services       Precautions / Restrictions Precautions Precautions: Fall Restrictions Weight Bearing Restrictions: Yes RLE Weight Bearing: Non weight bearing    Mobility  Bed Mobility Overal bed mobility: Needs Assistance Bed Mobility: Supine to Sit;Sit to Supine     Supine to sit: Min guard;HOB elevated Sit to supine: Min assist;HOB elevated   General bed mobility comments: increased time and effort, assist with RLE into bed. Good ability to scoot to head of bed.  Transfers Overall transfer level: Needs assistance Equipment used: None Transfers: Squat Pivot Transfers     Squat pivot transfers: Min guard     General transfer comment: min guard for safety. Cues for chair and hand placement. Maintains NWB on RLE. Performed to and from recliner/bed.  Ambulation/Gait             General Gait Details: nonambulatory   Stairs             Wheelchair Mobility    Modified Rankin (Stroke Patients Only)       Balance Overall balance assessment: Needs assistance Sitting-balance  support: No upper extremity supported;Feet supported Sitting balance-Leahy Scale: Good                                      Cognition Arousal/Alertness: Awake/alert Behavior During Therapy: WFL for tasks assessed/performed Overall Cognitive Status: Within Functional Limits for tasks assessed                                        Exercises      General Comments General comments (skin integrity, edema, etc.): Able to wiggle toes, sensation diminished in toes. Non-cyanotic and warmth noted.      Pertinent Vitals/Pain Pain Assessment: 0-10 Faces Pain Scale: Hurts even more Pain Location: RLE during mobility Pain Descriptors / Indicators: Throbbing Pain Intervention(s): Monitored during session;Patient requesting pain meds-RN notified    Home Living                      Prior Function            PT Goals (current goals can now be found in the care plan section) Acute Rehab PT Goals Patient Stated Goal: home PT Goal Formulation: With patient Time For Goal Achievement: 12/31/18 Potential to Achieve Goals: Good Progress towards PT goals: Progressing toward goals    Frequency    Min 3X/week      PT Plan Current plan remains  appropriate    Co-evaluation              AM-PAC PT "6 Clicks" Mobility   Outcome Measure  Help needed turning from your back to your side while in a flat bed without using bedrails?: A Little Help needed moving from lying on your back to sitting on the side of a flat bed without using bedrails?: A Little Help needed moving to and from a bed to a chair (including a wheelchair)?: A Little Help needed standing up from a chair using your arms (e.g., wheelchair or bedside chair)?: A Little Help needed to walk in hospital room?: Total Help needed climbing 3-5 steps with a railing? : Total 6 Click Score: 14    End of Session   Activity Tolerance: Patient tolerated treatment well Patient left: in  bed;with call bell/phone within reach;with SCD's reapplied   PT Visit Diagnosis: Other abnormalities of gait and mobility (R26.89);Pain Pain - Right/Left: Right Pain - part of body: Ankle and joints of foot     Time: 4034-7425 PT Time Calculation (min) (ACUTE ONLY): 14 min  Charges:  $Therapeutic Activity: 8-22 mins                     Charlsie Merles, PT, DPT.   [place]erton Mount 12/25/2018, 10:46 AM

## 2018-12-26 LAB — URINALYSIS, ROUTINE W REFLEX MICROSCOPIC
Bilirubin Urine: NEGATIVE
Glucose, UA: NEGATIVE mg/dL
Ketones, ur: NEGATIVE mg/dL
Leukocytes, UA: NEGATIVE
Nitrite: NEGATIVE
PH: 8 (ref 5.0–8.0)
Protein, ur: NEGATIVE mg/dL
Specific Gravity, Urine: 1.01 (ref 1.005–1.030)

## 2018-12-26 LAB — CBC
HCT: 32.4 % — ABNORMAL LOW (ref 36.0–46.0)
Hemoglobin: 10.6 g/dL — ABNORMAL LOW (ref 12.0–15.0)
MCH: 29.9 pg (ref 26.0–34.0)
MCHC: 32.7 g/dL (ref 30.0–36.0)
MCV: 91.5 fL (ref 80.0–100.0)
Platelets: 284 10*3/uL (ref 150–400)
RBC: 3.54 MIL/uL — ABNORMAL LOW (ref 3.87–5.11)
RDW: 13.2 % (ref 11.5–15.5)
WBC: 8.7 10*3/uL (ref 4.0–10.5)
nRBC: 0 % (ref 0.0–0.2)

## 2018-12-26 MED ORDER — MAGNESIUM CITRATE PO SOLN
1.0000 | Freq: Once | ORAL | Status: AC
  Administered 2018-12-26: 1 via ORAL
  Filled 2018-12-26: qty 296

## 2018-12-26 NOTE — Progress Notes (Signed)
PROGRESS NOTE  [name]  ZHG:[id number] DOB: 24-Jul-1971  DOA: 12/23/2018 PCP: Patient, No Pcp Per   Brief Narrative: 48 year old female with significant history of hydrocephalus, spina bifida, Chiari malformation type II and chronic immobility who had suffered from complex traumatic fracture of the right ankle, s/p   multiple staged surgical procedure for stabilization.  Hospitalist asked to see patient on account of sternal dull 4/10 chest pain during time in recovery room post-op.  No nausea, vomiting, diaphoresis.  Twelve-lead EKG done at time of chest pain was essentially normal.  She has history of pericarditis about 25 years ago during pregnancy and was said to be very anxious about this time.  Assessment & Plan:   Active Problems:   Post-traumatic arthritis of ankle, right  Chest pain Twelve-lead EKG unremarkable at time of chest pain Echo 12/24/2018 - EF 55-60%, unremarkable CTA 12/24/2018 neg for PE, Moderate gastric hiatal hernia - out patient follow -up Likely noncardiac  Leukocytosis No evidence of infectious etiology Possible stress margination Follow clinically     Subjective: Nomore chest pain or sob Objective:  Vitals:   12/25/18 0539 12/25/18 1250 12/25/18 2017 12/26/18 0357  BP: 121/70 118/66 124/62 138/81  Pulse: 80 79 83 84  Resp: 16 16 16 16   Temp: 98.6 F (37 C) 97.7 F (36.5 C) 97.7 F (36.5 C) 98.3 F (36.8 C)  TempSrc: Oral Oral Oral Oral  SpO2: 98% 99% 98% 96%  Weight:      Height:        Intake/Output Summary (Last 24 hours) at 12/26/2018 1118 Last data filed at 12/26/2018 0913 Gross per 24 hour  Intake 720 ml  Output 1700 ml  Net -980 ml   Filed Weights   12/23/18 0612  Weight: 127 kg    Examination:  General exam: No acute distress Respiratory system: Clear to auscultation. Respiratory effort normal. Cardiovascular system: S1 & S2 heard, RRR. No JVD, murmurs, rubs, gallops or clicks. No pedal edema. Gastrointestinal  system: Abdomen is nondistended, soft and nontender. No organomegaly or masses felt. Normal bowel sounds heard. Central nervous system: Alert and oriented. No focal neurological deficits. Extremities: Symmetric 5 x 5 power. Skin: No rashes, lesions or ulcers Psychiatry: Judgement and insight appear normal. Mood & affect appropriate.     Data Reviewed: I have personally reviewed following labs and imaging studies  CBC: Recent Labs  Lab 12/23/18 1304 12/26/18 0357  WBC 18.6* 8.7  HGB 13.0 10.6*  HCT 39.5 32.4*  MCV 91.4 91.5  PLT 324 284   Basic Metabolic Panel: Recent Labs  Lab 12/23/18 1304  CREATININE 0.99   GFR: Estimated Creatinine Clearance: 85.7 mL/min (by C-G formula based on SCr of 0.99 mg/dL). Liver Function Tests: No results for input(s): AST, ALT, ALKPHOS, BILITOT, PROT, ALBUMIN in the last 168 hours. No results for input(s): LIPASE, AMYLASE in the last 168 hours. No results for input(s): AMMONIA in the last 168 hours. Coagulation Profile: No results for input(s): INR, PROTIME in the last 168 hours. Cardiac Enzymes: Recent Labs  Lab 12/24/18 1026  TROPONINI <0.03   BNP (last 3 results) No results for input(s): PROBNP in the last 8760 hours. HbA1C: No results for input(s): HGBA1C in the last 72 hours. CBG: No results for input(s): GLUCAP in the last 168 hours. Lipid Profile: No results for input(s): CHOL, HDL, LDLCALC, TRIG, CHOLHDL, LDLDIRECT in the last 72 hours. Thyroid Function Tests: No results for input(s): TSH, T4TOTAL, FREET4, T3FREE, THYROIDAB in the last 72  hours. Anemia Panel: No results for input(s): VITAMINB12, FOLATE, FERRITIN, TIBC, IRON, RETICCTPCT in the last 72 hours.  Sepsis Labs: Recent Labs  Lab 12/23/18 1304 12/26/18 0357  WBC 18.6* 8.7    No results found for this or any previous visit (from the past 240 hour(s)).       Radiology Studies: Ct Angio Chest Pe W Or Wo Contrast  Result Date: 12/24/2018 CLINICAL DATA:   48 year old female with shortness of breath, chest pain, arm numbness. Recent right ankle surgery. EXAM: CT ANGIOGRAPHY CHEST WITH CONTRAST TECHNIQUE: Multidetector CT imaging of the chest was performed using the standard protocol during bolus administration of intravenous contrast. Multiplanar CT image reconstructions and MIPs were obtained to evaluate the vascular anatomy. CONTRAST:  60mL ISOVUE-370 IOPAMIDOL (ISOVUE-370) INJECTION 76% COMPARISON:  Chest radiograph 08/03/2005. FINDINGS: Cardiovascular: Good contrast bolus timing in the pulmonary arterial tree. Mild respiratory motion. No focal filling defect identified in the pulmonary arteries to suggest acute pulmonary embolism. Cardiac size at the upper limits of normal to mildly increased. No pericardial effusion. Little contrast in the thoracic aorta which appears grossly normal. Mediastinum/Nodes: No mediastinal lymphadenopathy. Moderate gastric hiatal hernia. Lungs/Pleura: Major airways are patent. No pleural effusion or abnormal pulmonary opacity; there is mild left lower lobe atelectasis associated with the gastric hernia. Upper Abdomen: Negative visible liver, gallbladder, spleen, pancreas, adrenal glands, kidneys, and bowel in the upper abdomen. Musculoskeletal: Lower thoracic through upper lumbar posterior spinal fusion hardware. Evidence of solid posterior element arthrodesis. T10-T11 interbody ankylosis. No acute osseous abnormality identified. There is a small subcutaneous catheter in the ventral right chest and visible upper abdomen of unclear etiology. Review of the MIP images confirms the above findings. IMPRESSION: 1. No evidence of acute pulmonary embolus. 2. No acute findings in the chest. 3. Moderate gastric hiatal hernia. Electronically Signed   By: Odessa Fleming M.D.   On: 12/24/2018 15:16        Scheduled Meds: . docusate sodium  100 mg Oral BID  . DULoxetine  30 mg Oral QHS  . enoxaparin (LOVENOX) injection  40 mg Subcutaneous Q24H    . senna  1 tablet Oral BID   Continuous Infusions: . sodium chloride Stopped (12/24/18 1031)  . methocarbamol (ROBAXIN) IV       LOS: 3 days    Time spent: 41    Jackie Plum, MD [facility] Pager [phone]  If 7PM-7AM, please contact night-coverage [email or web address] Password [email or web address] 12/26/2018, 11:18 AM

## 2018-12-26 NOTE — Progress Notes (Signed)
Subjective: 3 Days Post-Op Procedure(s) (LRB): right tibiotalar calcaneal arthrodesis (Right) RIGHT ANKLE REMOVAL OF DEEP IMPLANTS (Right) RIGHT MEDIAL MALLEOLUS OSTEOTOMY (Right) Patient reports pain as moderate.   Reports pain, some numbness, but reports she had numbness pre-op Biggest c/o this AM is constipation. No BM. Reports prior positive flatus but not today and is feeling uncomfortable  Objective: Vital signs in last 24 hours: Temp:  [97.7 F (36.5 C)-98.3 F (36.8 C)] 98.3 F (36.8 C) (01/26 0357) Pulse Rate:  [79-84] 84 (01/26 0357) Resp:  [16] 16 (01/26 0357) BP: (118-138)/(62-81) 138/81 (01/26 0357) SpO2:  [96 %-99 %] 96 % (01/26 0357)  Intake/Output from previous day: 01/25 0701 - 01/26 0700 In: 960 [P.O.:960] Out: 2350 [Urine:2350] Intake/Output this shift: No intake/output data recorded.  Recent Labs    12/23/18 1304 12/26/18 0357  HGB 13.0 10.6*   Recent Labs    12/23/18 1304 12/26/18 0357  WBC 18.6* 8.7  RBC 4.32 3.54*  HCT 39.5 32.4*  PLT 324 284   Recent Labs    12/23/18 1304  CREATININE 0.99   No results for input(s): LABPT, INR in the last 72 hours.  Neurologically intact ABD soft Neurovascular intact Sensation intact distally Intact pulses distally Dorsiflexion/Plantar flexion intact Incision: dressing C/D/I and no drainage No cellulitis present Compartment soft no sign of DVT  Assessment/Plan: 3 Days Post-Op Procedure(s) (LRB): right tibiotalar calcaneal arthrodesis (Right) RIGHT ANKLE REMOVAL OF DEEP IMPLANTS (Right) RIGHT MEDIAL MALLEOLUS OSTEOTOMY (Right) Advance diet Up with therapy D/C IV fluids Continue NWB RLE Mag citrate , continue colace and senokot Appears based upon hospitalist note from yesterday chest pain likely noncardiac etiology. Can follow up outpt for hiatal hernia Plan for D/C today if stable from medical standpoint ASA for DVT ppx  Dorothy Spark 12/26/2018, 8:25 AM

## 2018-12-27 ENCOUNTER — Encounter (HOSPITAL_COMMUNITY): Payer: Self-pay | Admitting: Orthopedic Surgery

## 2018-12-27 MED ORDER — METHOCARBAMOL 500 MG PO TABS: 500.0000 mg | ORAL_TABLET | Freq: Four times a day (QID) | ORAL | 0 refills | Status: DC | PRN

## 2018-12-27 MED ORDER — HYDROCODONE-ACETAMINOPHEN 5-325 MG PO TABS: 1.0000 | ORAL_TABLET | ORAL | 0 refills | Status: AC | PRN

## 2018-12-27 NOTE — Discharge Summary (Signed)
Physician Discharge Summary  Patient ID: Renee FreesBarbara L Chaudhary MRN: [id number] DOB/AGE: 49-21-1972 49 y.o.  Admit date: 12/23/2018 Discharge date: 12/27/2018  Admission Diagnoses: Post traumatic R ankle OA; hx of hypoalbuminemia; acute blood loss anemia; spina bifida; chiara malformation; malignant HTN; pericariditis.  Discharge Diagnoses:  Active Problems:   Post-traumatic arthritis of ankle, right same as above  Discharged Condition: stable  Hospital Course: [facility] for elective R ankle tibiotalar calcaneal arthrodesis by Dr. Toni ArthursJohn Hewitt.  The patient tolerated the procedure well without complication.  She was then admitted to the hospital.  Her first night in the hospital she experience CP, SOB, and numbness into bilateral UEs.  EKG demonstrated normal sinus rhythm.  The Medicine team was then consulted.  Echo and CT angio were negative for acute cardiac abnormalities.  The patient was found to have a hiatal hernia.  It is recommended by the Medicine team that she follow up for her hiatal hernia in an outpatient setting.  She worked well with therapy.  No other complaints/concerns during her stay in the hospital.  She is to be D/C'd home on 12/27/18.  Consults: Medicine team; PT/OT.  Significant Diagnostic Studies: Radiographs to ensure satisfactory anatomic alignment during operative procedure; Echo, CT angio, and tropin level to r/o acute cardiac abnormality.  Treatments: IV hydration, antibiotics: Ancef, analgesia: acetaminophen, Dilaudid and oxycodone, anticoagulation: lovenox and surgery: as stated above.  Discharge Exam: Blood pressure 116/67, pulse 71, temperature 97.6 F (36.4 C), temperature source Oral, resp. rate 16, height 5' (1.524 m), weight 127 kg, last menstrual period 12/02/2018, SpO2 96 %. General: WDWN patient in NAD. Psych:  Appropriate mood and affect. Neuro:  A&O x 3, Moving all extremities, sensation intact to light touch HEENT:   EOMs intact Chest:  Even non-labored respirations Skin: SLS C/D/I, no rashes or lesions Extremities: warm/dry, no edema, erythema or echymosis.  No lymphadenopathy. Pulses: Popliteus 2+ MSK:  ROM: EHL/FHL intact, MMT: able to perform quad set,   Disposition: Home, self care   Allergies as of 12/27/2018      Reactions   Anesthetics, Amide Other (See Comments)   Pt has Malignant Hyperthermia which is an allergy to general anesthesia   Percocet [oxycodone-acetaminophen] Itching   Gabapentin Other (See Comments)   Depression.   Latex Rash   Other Rash, Other (See Comments)   Latex Adhesive tape: prefers to use paper tape      Medication List    TAKE these medications   aspirin EC 81 MG tablet Take 1 tablet (81 mg total) by mouth 2 (two) times daily.   docusate sodium 100 MG capsule Commonly known as:  COLACE Take 1 capsule (100 mg total) by mouth 2 (two) times daily. While taking narcotic pain medicine.   DULoxetine 30 MG capsule Commonly known as:  CYMBALTA Take 1 capsule (30 mg total) by mouth at bedtime.   ibuprofen 200 MG tablet Commonly known as:  ADVIL,MOTRIN Take 400 mg by mouth every 6 (six) hours as needed for headache or moderate pain.   methocarbamol 500 MG tablet Commonly known as:  ROBAXIN Take 1 tablet (500 mg total) by mouth every 6 (six) hours as needed for muscle spasms (spasms).   oxyCODONE 5 MG immediate release tablet Commonly known as:  ROXICODONE Take 1 tablet (5 mg total) by mouth every 4 (four) hours as needed for up to 5 days for moderate pain or severe pain.   senna 8.6 MG Tabs tablet Commonly  known as:  SENOKOT Take 2 tablets (17.2 mg total) by mouth 2 (two) times daily.      Follow-up Information    Toni ArthursHewitt, John, MD. Schedule an appointment as soon as possible for a visit in 2 weeks.   Specialty:  Orthopedic Surgery Contact information: [address] [place] [address] [phone]           Signed: Lolly MustacheJustin  Shilee Biggs PA-C [facility] Office:  [phone]

## 2018-12-27 NOTE — Progress Notes (Signed)
Physical Therapy Treatment Patient Details Name: [name] MRN: [id number] DOB: May 21, 1971 Today's Date: 12/27/2018    History of Present Illness 48 year old female with significant history of hydrocephalus, spina bifida, Chiari malformation type II and chronic immobility. She fell at work May 2019 and sustained a complex traumatic fracture of the right ankle. She underwent ORIF 04/23/18.  Due to non-union, pt underwent hardware removal and R ankle arthrodesis 12/23/18.     PT Comments    Continuing work on functional mobility and activity tolerance;  [name] is managing very well, and is clearly well-versed in managing NWB RLE; Described to me fully how she manages at home, mostly in recliner/BSC/power scooter, and able to return demonstrate bed mobility and transfers; Assited pt with donning a brief during session as well; All acute PT goals are met; will dc acute PT; OK for dc home from PT standpoint    Follow Up Recommendations  No PT follow up;Supervision - Intermittent     Equipment Recommendations  None recommended by PT    Recommendations for Other Services       Precautions / Restrictions Restrictions RLE Weight Bearing: Non weight bearing    Mobility  Bed Mobility Overal bed mobility: Modified Independent Bed Mobility: Supine to Sit     Supine to sit: Modified independent (Device/Increase time)     General bed mobility comments: Incr time  Transfers Overall transfer level: Needs assistance Equipment used: None Transfers: Stand Pivot Transfers   Stand pivot transfers: Supervision       General transfer comment: Recliner locked in place, angled in front and to [name]'s L; She Stood from bed on LLE with UE support, then propped R knee on recliner seat while she pulled up her Depend-brief; then [name] sat back down to the bed, readjusted, and then stood and pivoted to recliner; managing quite well  Ambulation/Gait             General Gait Details:  nonambulatory   Stairs             Wheelchair Mobility    Modified Rankin (Stroke Patients Only)       Balance     Sitting balance-Leahy Scale: Good       Standing balance-Leahy Scale: Fair                              Cognition Arousal/Alertness: Awake/alert Behavior During Therapy: WFL for tasks assessed/performed Overall Cognitive Status: Within Functional Limits for tasks assessed                                        Exercises      General Comments        Pertinent Vitals/Pain Pain Assessment: Faces Faces Pain Scale: Hurts a little bit Pain Location: RLE during mobility Pain Descriptors / Indicators: Throbbing Pain Intervention(s): Monitored during session    Home Living                      Prior Function            PT Goals (current goals can now be found in the care plan section) Acute Rehab PT Goals Patient Stated Goal: Is happy to be getting home today Progress towards PT goals: Goals met/education completed, patient discharged from PT    Frequency  Min 3X/week      PT Plan Current plan remains appropriate    Co-evaluation              AM-PAC PT "6 Clicks" Mobility   Outcome Measure  Help needed turning from your back to your side while in a flat bed without using bedrails?: None Help needed moving from lying on your back to sitting on the side of a flat bed without using bedrails?: None Help needed moving to and from a bed to a chair (including a wheelchair)?: None Help needed standing up from a chair using your arms (e.g., wheelchair or bedside chair)?: None Help needed to walk in hospital room?: Total Help needed climbing 3-5 steps with a railing? : Total 6 Click Score: 18    End of Session   Activity Tolerance: Patient tolerated treatment well Patient left: in chair;with call bell/phone within reach Nurse Communication: Mobility status(and OK for dc home) PT Visit  Diagnosis: Other abnormalities of gait and mobility (R26.89);Pain Pain - Right/Left: Right Pain - part of body: Ankle and joints of foot     Time: 4619-0122 PT Time Calculation (min) (ACUTE ONLY): 17 min  Charges:  $Therapeutic Activity: 8-22 mins                     Roney Marion, PT  Acute Rehabilitation Services Pager [phone] Office [place] 12/27/2018, 1:14 PM

## 2020-03-25 IMAGING — CR DG ANKLE 2V *R*
2 series · 2 of 2 positions shown · non-contrast
Comparison: None.

CLINICAL DATA: Status post fall.

EXAM:
RIGHT ANKLE - 2 VIEW

[x ankle ap right]
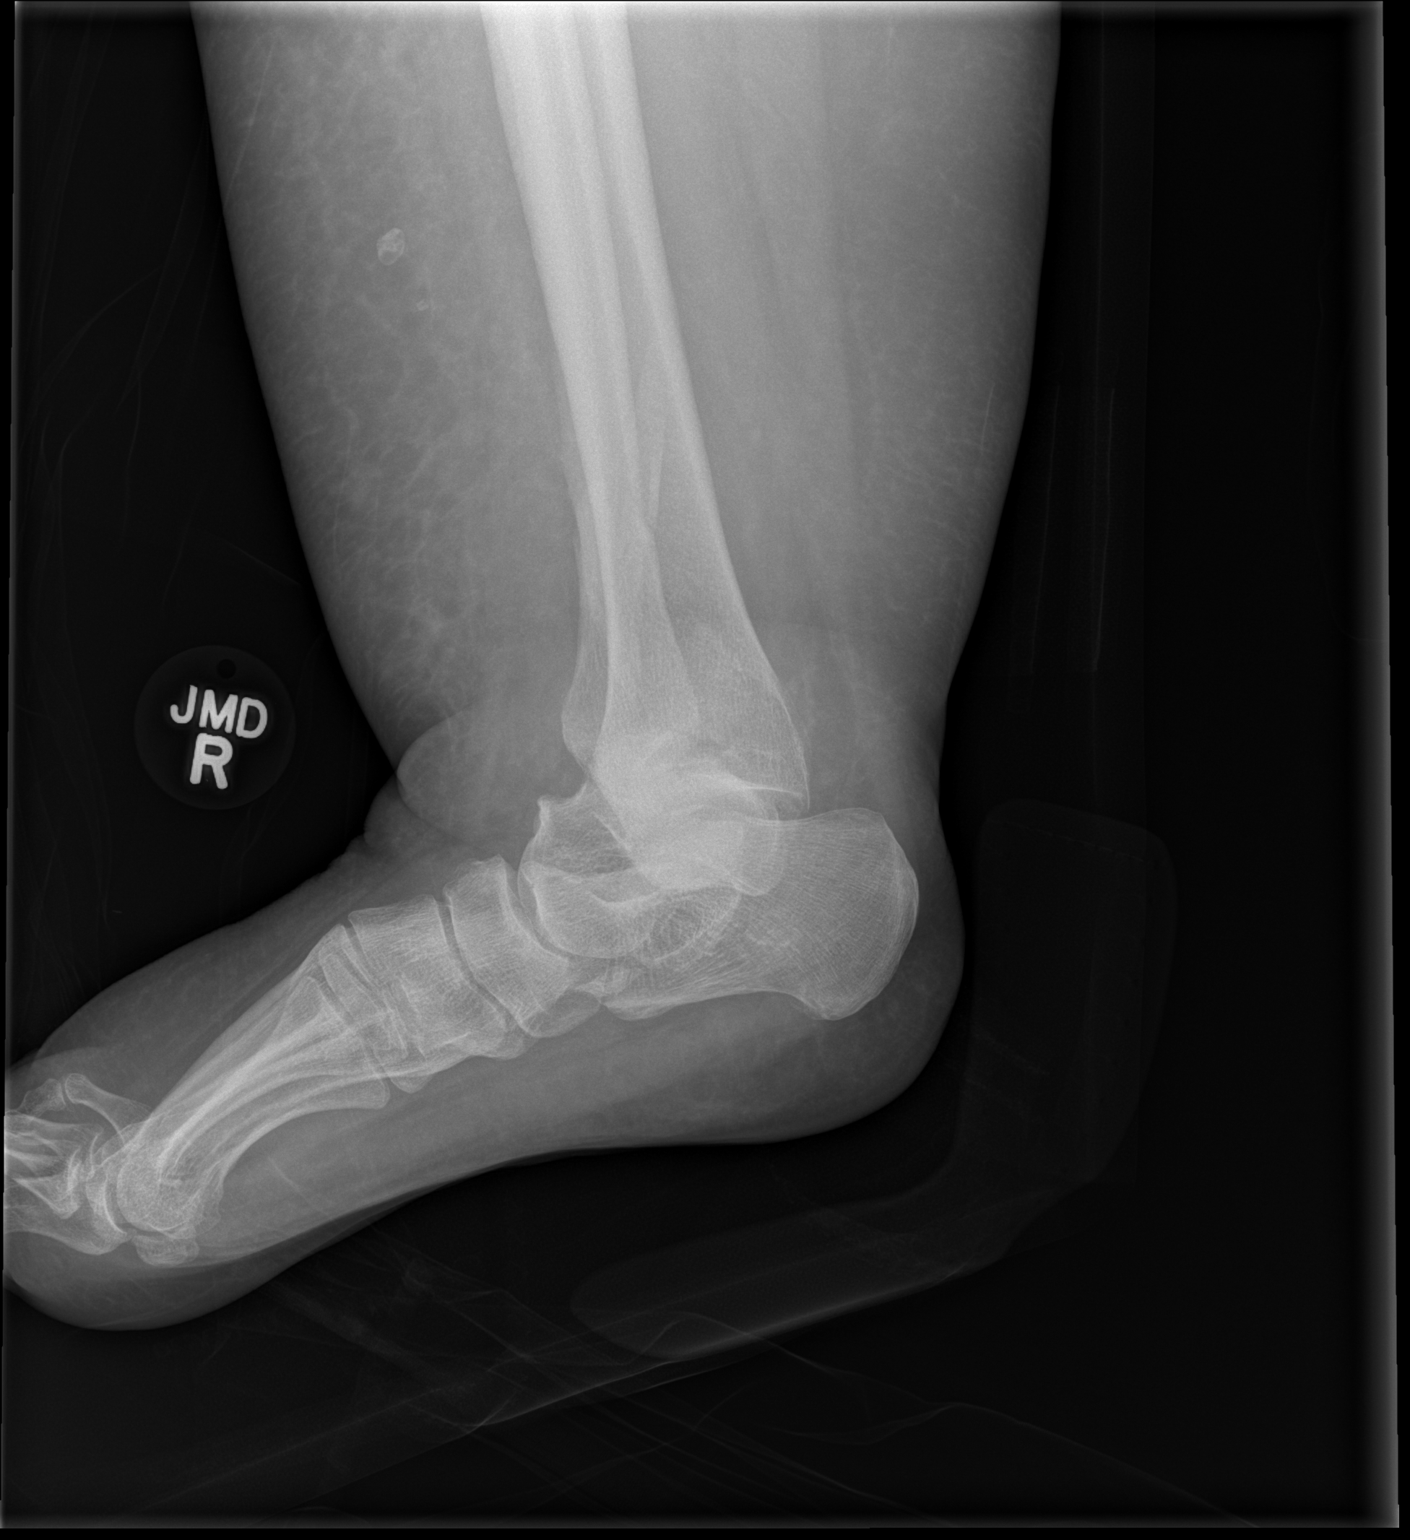

[x ankle obl right]
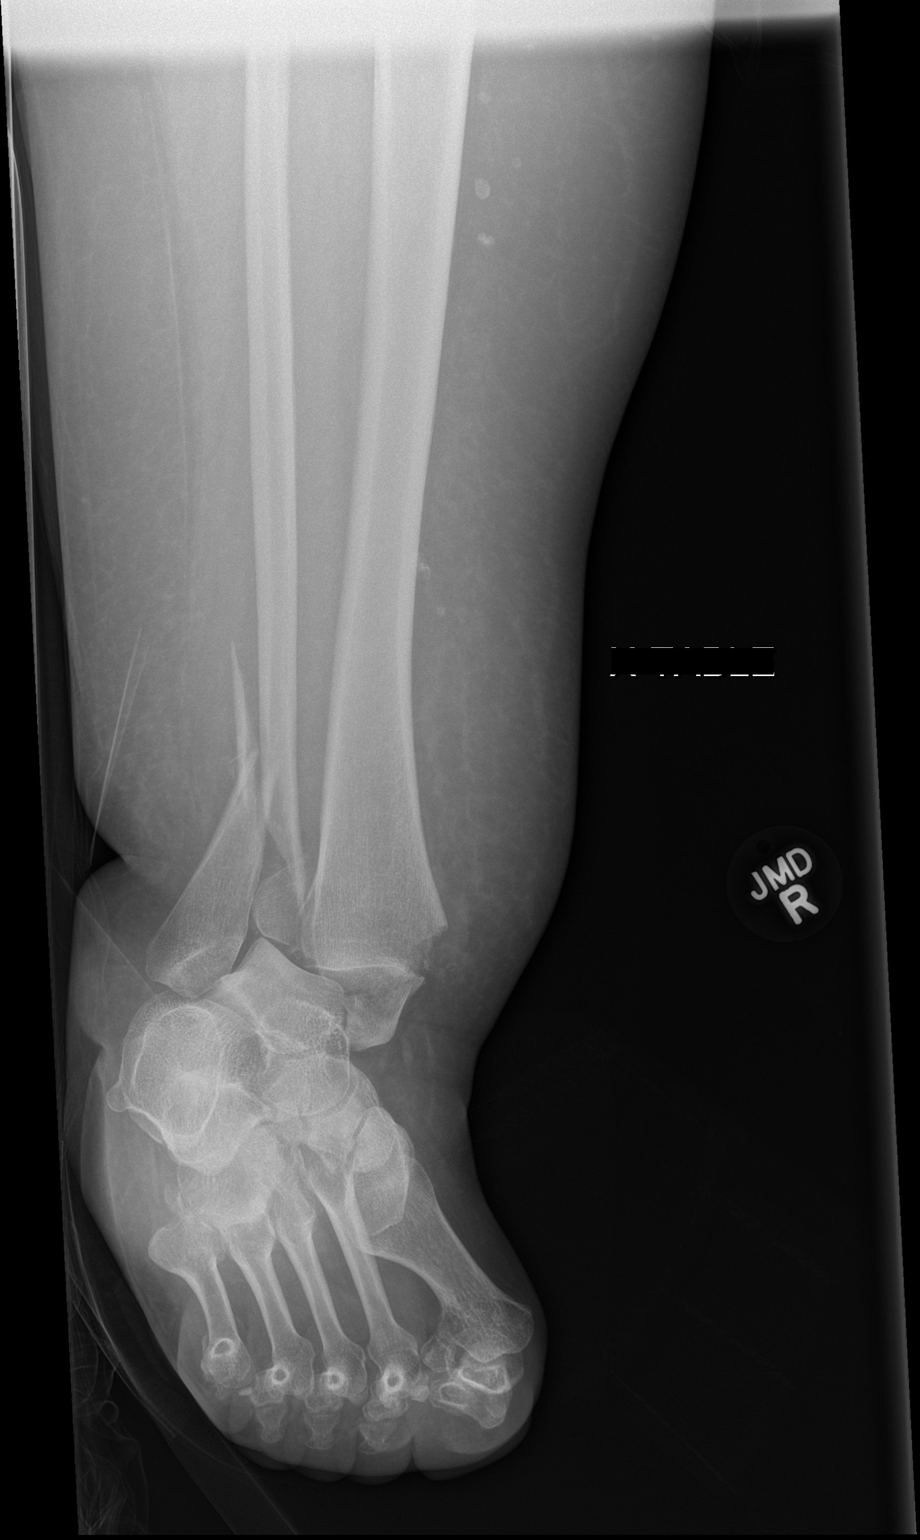

[2 of 2 positions shown; findings below may reference images not displayed]

FINDINGS: Generalized osteopenia. Comminuted oblique fracture of the distal
fibular diaphysis with apex medial angulation and 9 mm of lateral
displacement.

Comminuted and displaced medial malleolar fracture with 14 mm of
lateral displacement. Disruption of the ankle mortise with the talar
dome dislocated laterally by 17 mm. Displaced posterior malleolar
fracture of the distal tibia displaced laterally.

No other fracture or dislocation.
IMPRESSION: 1. Trimalleolar displaced fracture of right ankle. Lateral
dislocation of the talar dome relative to the tibial plafond.

## 2020-03-25 IMAGING — CT CT ANKLE*R* W/O CM
3 of 4 series · 15 of 33 positions shown, 19 images · non-contrast
Comparison: None.

CLINICAL DATA: Status post fall, trimalleolar fracture with
interval reduction

EXAM:
CT OF THE RIGHT ANKLE WITHOUT CONTRAST
TECHNIQUE: Multidetector CT imaging of the right ankle was performed according
to the standard protocol. Multiplanar CT image reconstructions were
also generated.

[Series 4: axial st · axial · 0.34mm/px · z∈[-134,+14]mm · 9 of 118 slices shown, 12 images]
[im 10/118  soft-tissue]
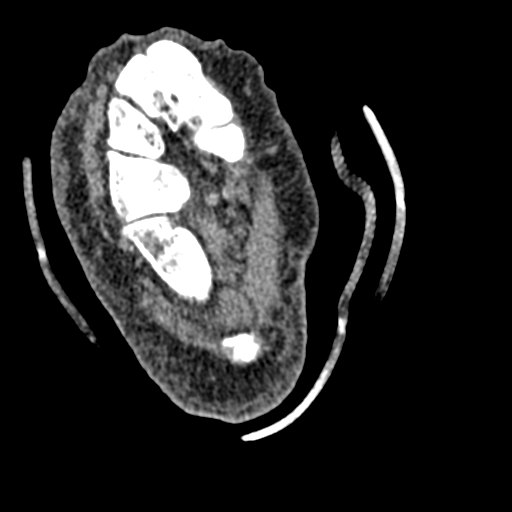
[im 10/118  bone]
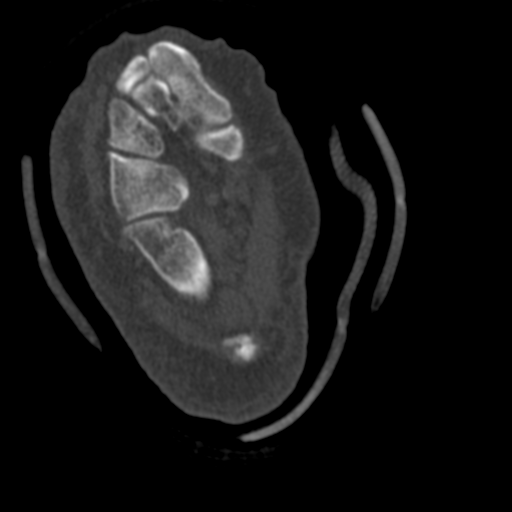
[im 28/118  bone]
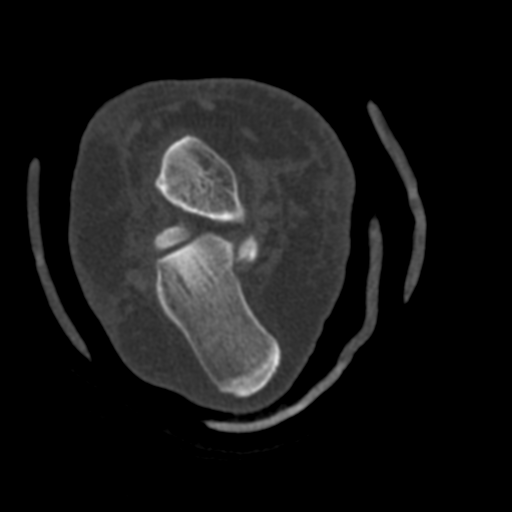
[im 37/118  bone]
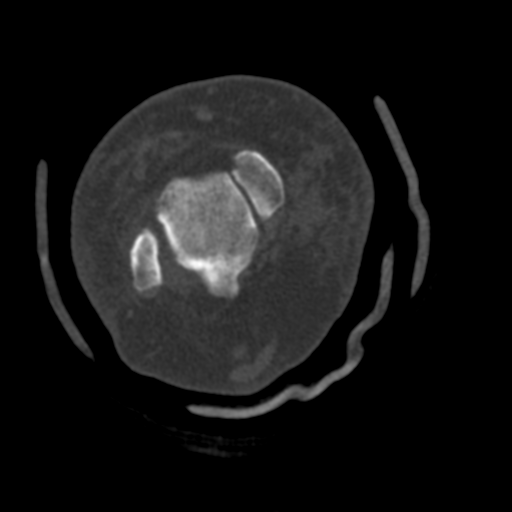
[im 46/118  bone]
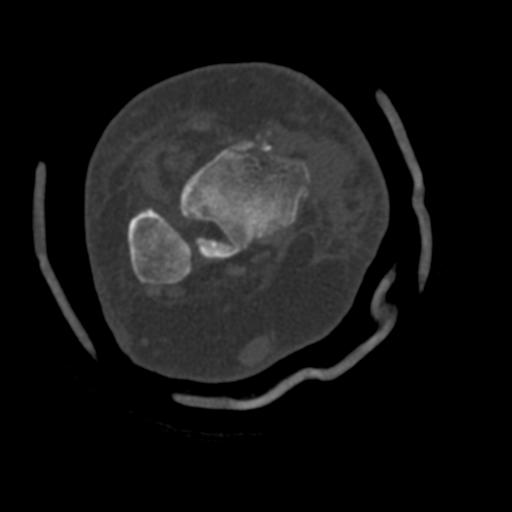
[im 64/118  soft-tissue]
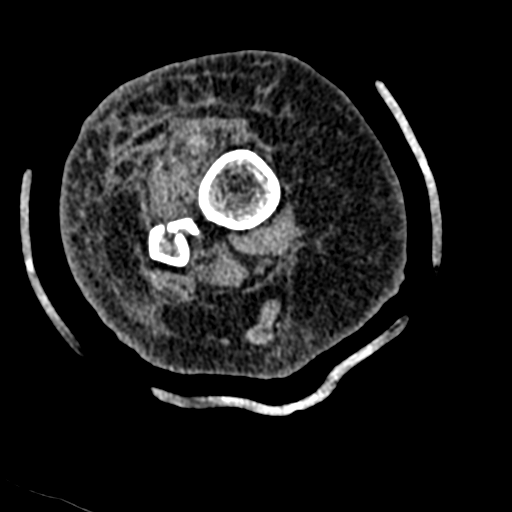
[im 64/118  bone]
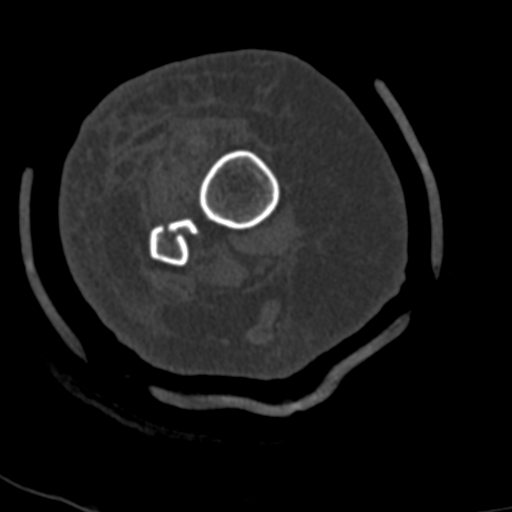
[im 73/118  bone]
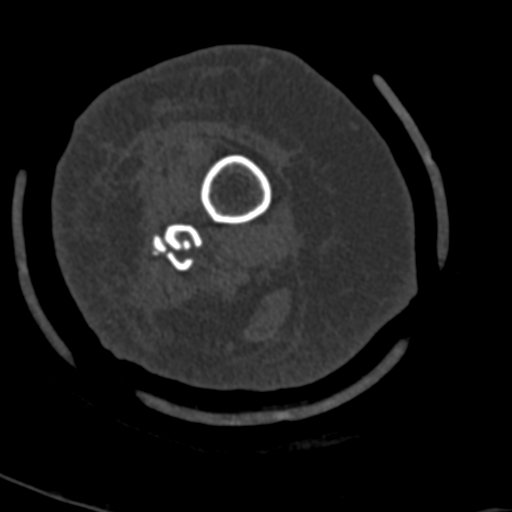
[im 82/118  bone]
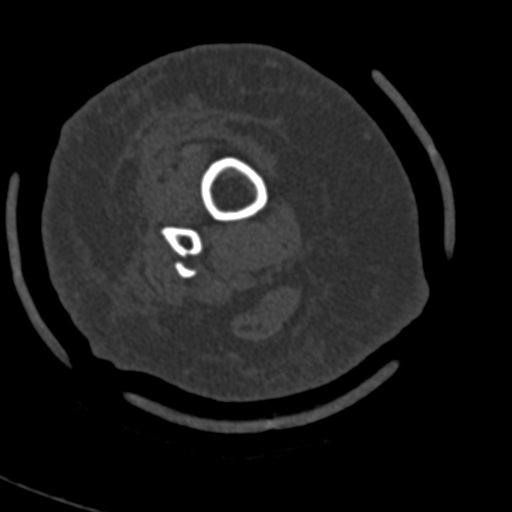
[im 100/118  bone]
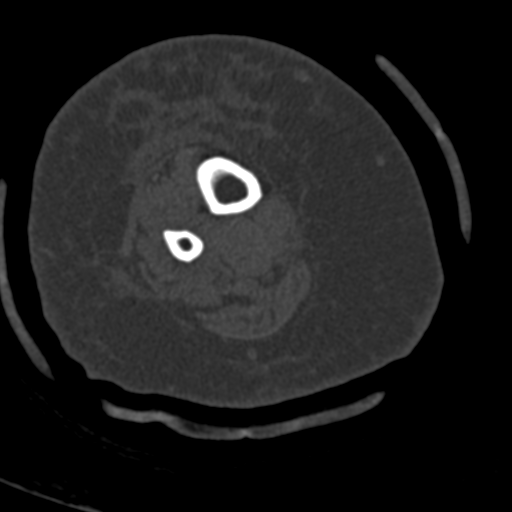
[im 109/118  soft-tissue]
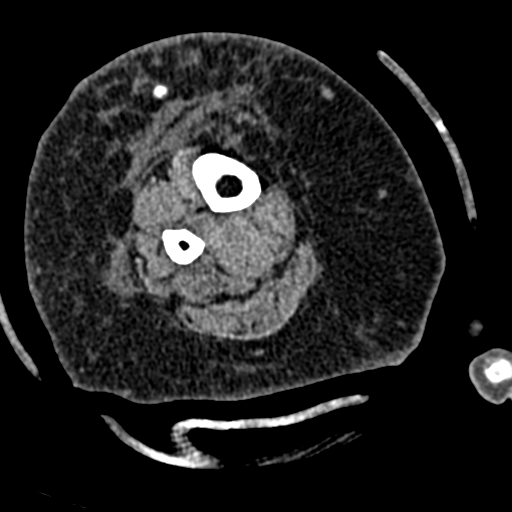
[im 109/118  bone]
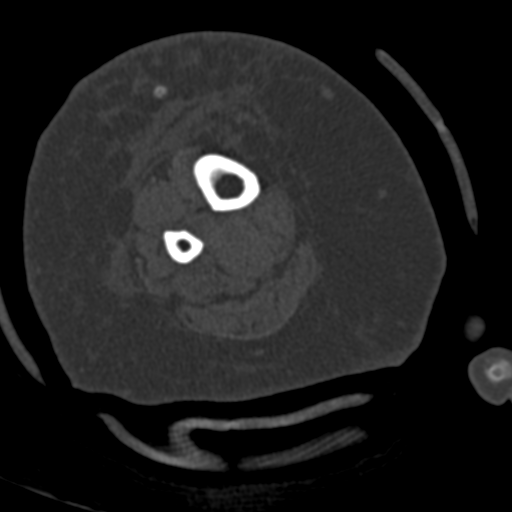

[Series 9: coronal st · coronal · 0.33mm/px · 1 of 85 slices shown]
[im 43/85  bone]
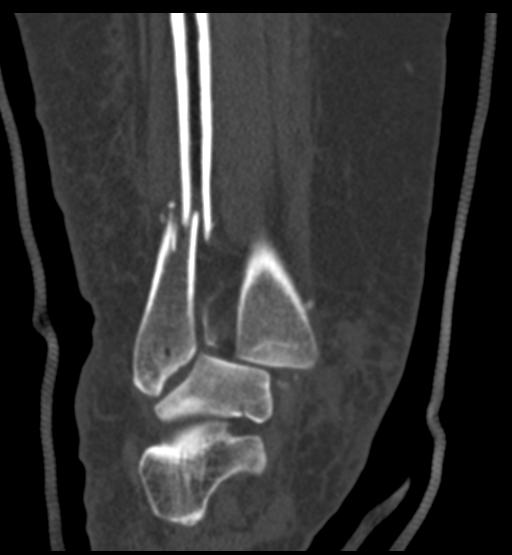

[Series 10: sagittal st · sagittal · 0.35mm/px · 5 of 103 slices shown, 6 images]
[im 35/103  bone]
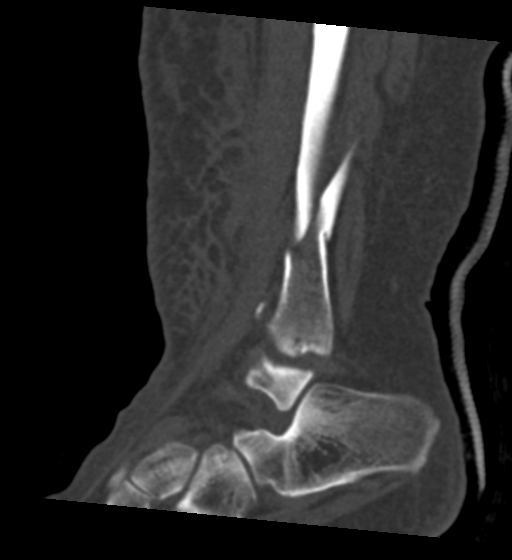
[im 43/103  bone]
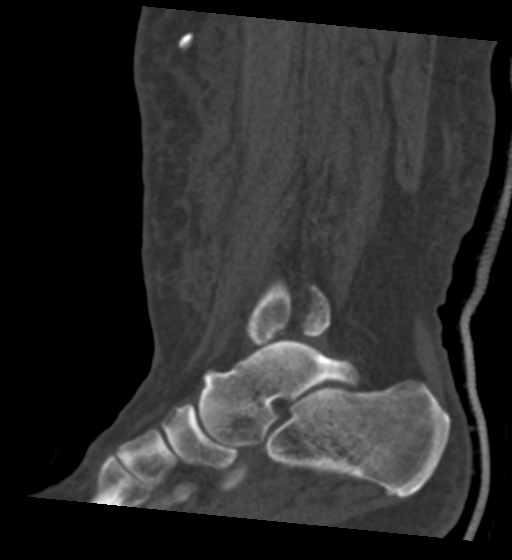
[im 52/103  soft-tissue]
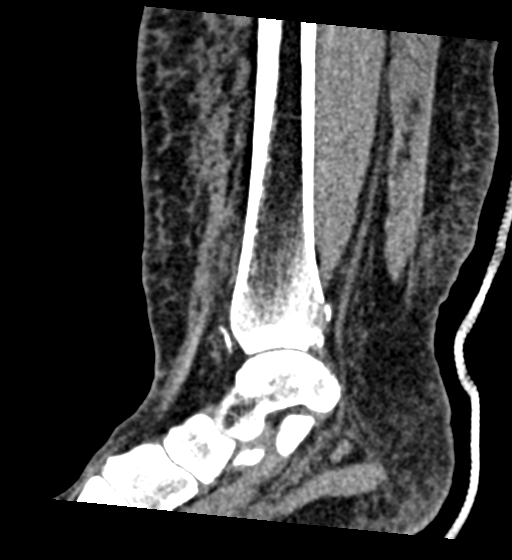
[im 52/103  bone]
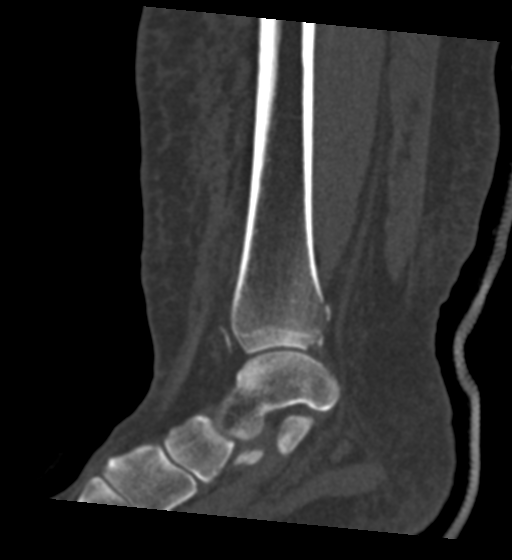
[im 60/103  bone]
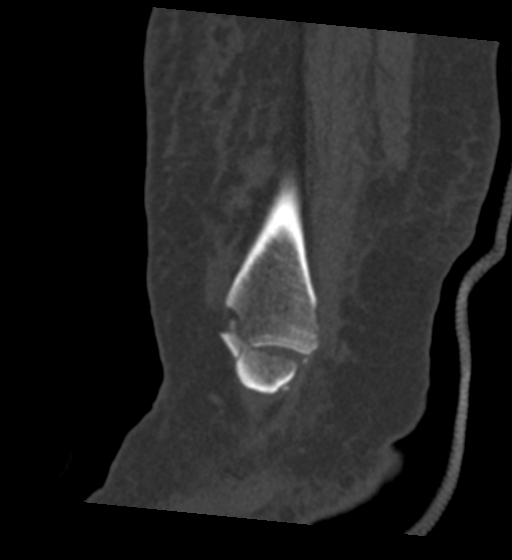
[im 69/103  bone]
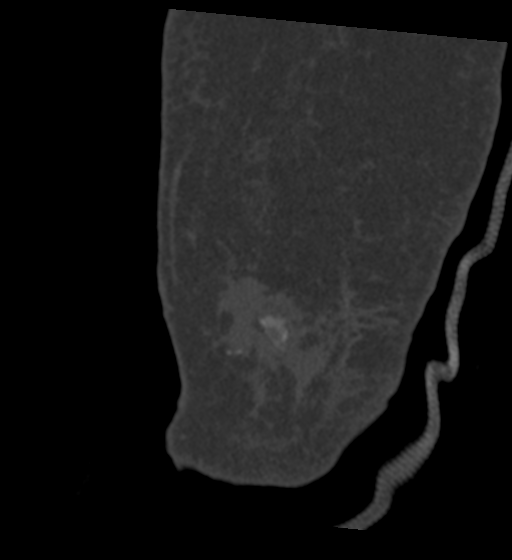

[15 of 33 positions shown; findings below may reference images not displayed]

FINDINGS: Bones/Joint/Cartilage

Comminuted oblique fracture of the distal fibular diaphysis with 4
mm of lateral displacement. Mild apex medial angulation.

Displaced posterior malleolar fracture with the fracture fragment
displaced laterally by 12 mm.

Comminuted medial malleolar fracture with the medial malleolus
displaced laterally by 13 mm.

Widening of the tibiotalar joint most consistent with a syndesmotic
injury.

14 mm lateral subluxation of the talar dome relative to the tibial
plafond.

Mild tibiotalar joint space narrowing.

No significant ankle joint effusion.

Normal subtalar joints.

No other acute fracture or dislocation.

Ligaments

Suboptimally assessed by CT.

Muscles and Tendons

Flexor, extensor, and peroneal tendons are intact. Achilles tendon
is intact. Plantar fascia is grossly intact.

Soft tissues

No fluid collection or hematoma.  Muscles are normal.
IMPRESSION: 1. Trimalleolar fracture with lateral subluxation of the talar dome
relative to the tibial plafond and as described above.

## 2020-03-25 IMAGING — DX DG ANKLE 2V *R*
2 series · 2 of 2 positions shown · non-contrast
Comparison: 04/12/2018

CLINICAL DATA: Post reduction

EXAM:
RIGHT ANKLE - 2 VIEW

[ankle ap]
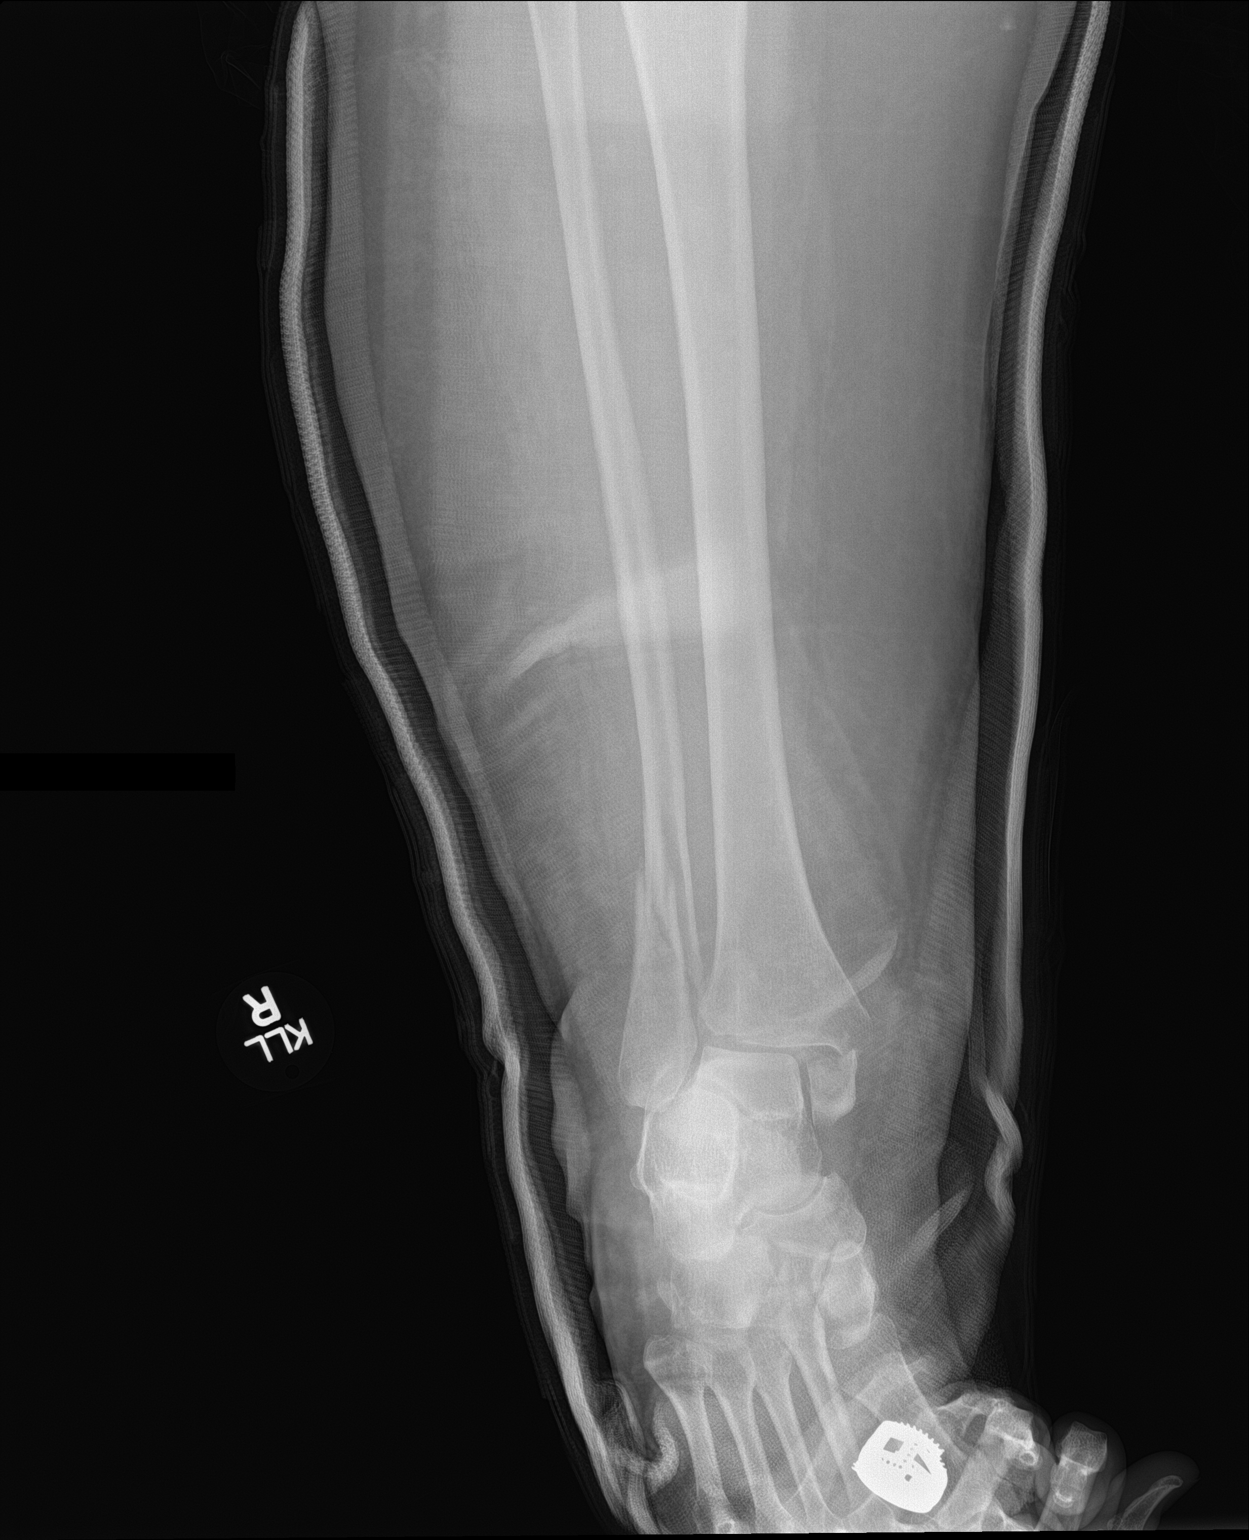

[ankle lat]
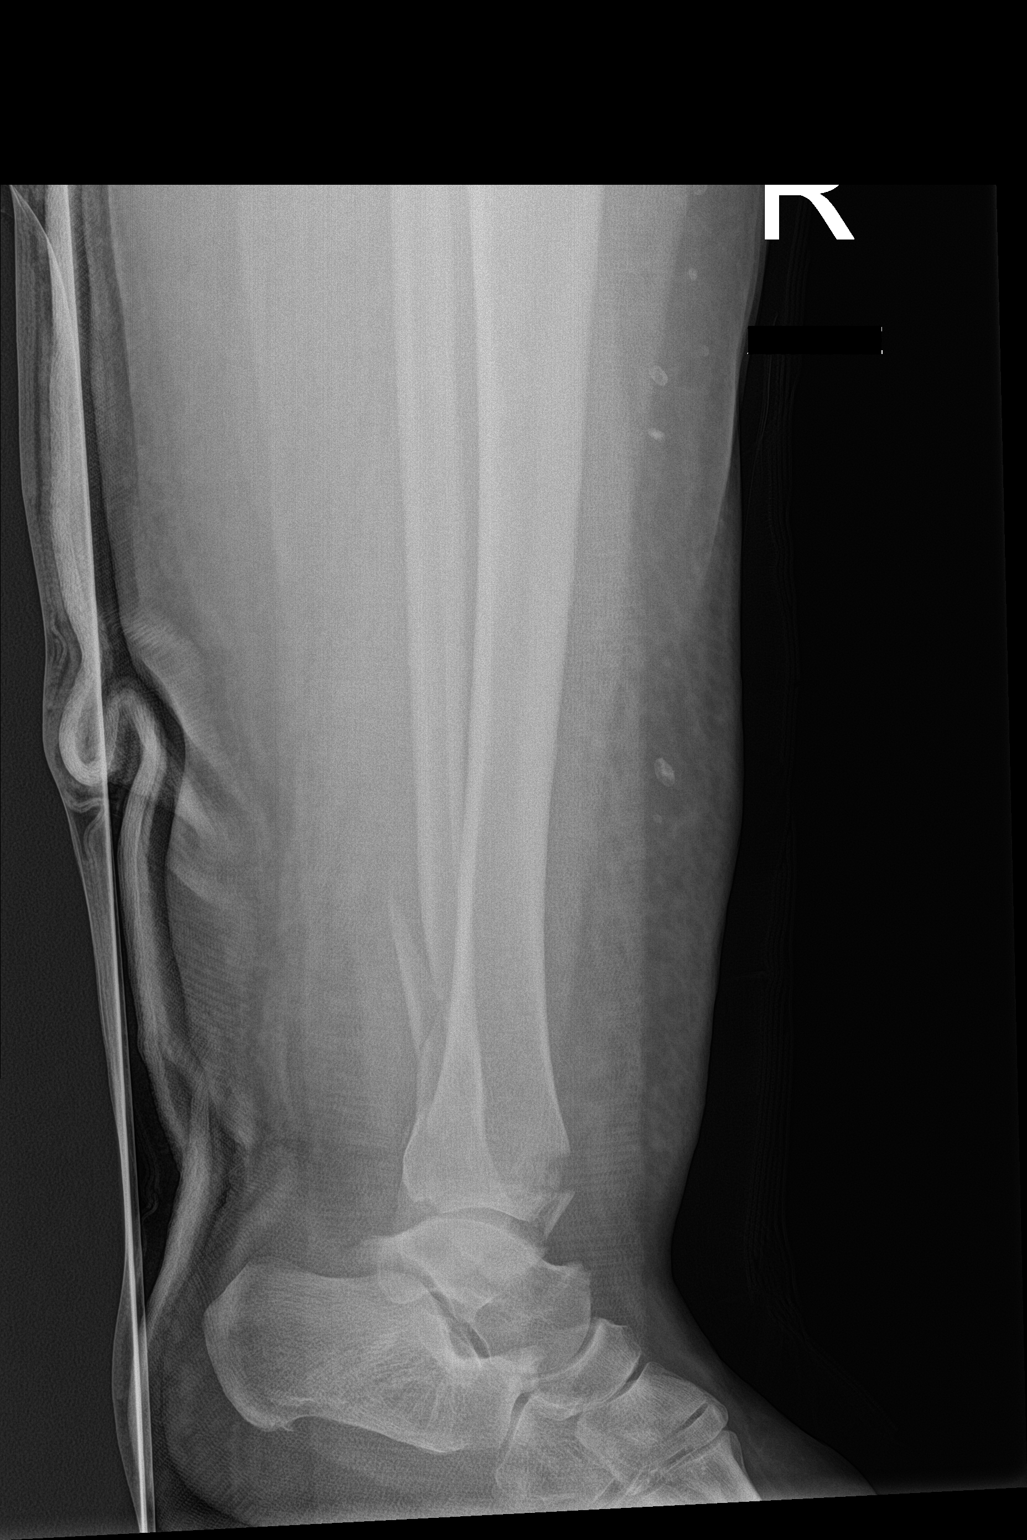

[2 of 2 positions shown; findings below may reference images not displayed]

FINDINGS: Comminuted fracture through the distal right fibular metaphysis with
mild displacement. Fracture through the base of the medial
malleolus. Probable posterior malleolar fracture. Disruption of the
ankle mortise. Interval reduction of the previously seen dislocated
talus.
IMPRESSION: Trimalleolar fracture with disruption of the ankle mortise. Interval
reduction of the dislocated talus.

## 2020-04-03 ENCOUNTER — Other Ambulatory Visit: Payer: Self-pay

## 2020-04-03 ENCOUNTER — Encounter: Payer: Self-pay | Admitting: Physical Medicine & Rehabilitation

## 2020-04-03 ENCOUNTER — Encounter
Payer: No Typology Code available for payment source | Attending: Physical Medicine & Rehabilitation | Admitting: Physical Medicine & Rehabilitation

## 2020-04-03 VITALS — BP 112/78 | HR 90 | Temp 98.5°F | Ht 60.0 in | Wt 290.0 lb

## 2020-04-03 DIAGNOSIS — Q052 Lumbar spina bifida with hydrocephalus: Secondary | ICD-10-CM | POA: Insufficient documentation

## 2020-04-03 DIAGNOSIS — G8928 Other chronic postprocedural pain: Secondary | ICD-10-CM | POA: Diagnosis present

## 2020-04-03 DIAGNOSIS — G5701 Lesion of sciatic nerve, right lower limb: Secondary | ICD-10-CM | POA: Diagnosis not present

## 2020-04-03 MED ORDER — PREGABALIN 75 MG PO CAPS: 75.0000 mg | ORAL_CAPSULE | Freq: Two times a day (BID) | ORAL | 1 refills | Status: DC

## 2020-04-03 NOTE — Progress Notes (Signed)
Subjective:    Patient ID: [name], female    DOB: 1971/10/16, 49 y.o.   MRN: [id number]  HPI  49-year-old female with history of spina bifida and type II Chiari malformation with who is referred by Dr. Victorino Dike for right ankle and foot pain.  The patient had a work related injury when she was employed at [employer] on 04/12/2018.  The patient  presented to the [facility] emergency department. She underwent closed reduction of the fracture dislocation. The patient followed up with Dr. Duwayne Heck and underwent right ankle open reduction internal fixation of a trimalleolar fracture. The patient was seen at: Inpatient rehab unit, Dr. Ivory Broad was the attending physician.  The patient was nonweightbearing.  On admission to rehab required mod assist stand pivot transfers mod to max with ADLs.  Upon discharge from rehab the patient required supervision for car transfers with sliding board.  Modified independent upper extremity ADLs but did require assistance for lower body ADLs. Has had follow up with ortho foot and ankle Dr Victorino Dike fusion is stable  Pt received home health PT after CIR admission.  The patient and husband state that she has not been able to access the bathroom with her scooter.  Rehab discharge summary indicated that she could access the bathroom but it did indicate whether this was with a wheelchair. On 05/18/2018 patient had hardware removal due to loss of fixation with placement of external fixator. On 06/15/2019 was removal of external fixator with placement of Steinmann pin On 12/27/2018 patient underwent right ankle tibiotalar calcaneal arthrodesis by Dr. Victorino Dike. Numbness in RIght lower ext, swelling, problems with toilet and bed transfer.  Feels numbness came on after the external fixator placement  No increased weakness in the LLE   Allowed WB, tolerates for short periods of time,   Used to walk with walker prior to the fracture   Bed is too high, unable to get  through doorways with scooter  Unable to get into tub Still taking "bird baths"  Stays in recliner "24/7", 2 steps in and out of house with a ramp/    On Pregabalin for leg pain , on Cymbalta for neuropathy in hands and feet.   Hx Spina Bifida with Chiari 2 malformation , hx of hydrocephalus and shunting Seen by neurosurgery and repeat spine MRI reportedly without changes  Urinary urgency since ankle injury  Pain Inventory Average Pain 5 Pain Right Now 2 My pain is stabbing and aching  In the last 24 hours, has pain interfered with the following? General activity 10 Relation with others 10 Enjoyment of life 10 What TIME of day is your pain at its worst? daytime Sleep (in general) Fair  Pain is worse with: walking, bending, standing and some activites Pain improves with: rest Relief from Meds: 2  Mobility how many minutes can you walk? 0 ability to climb steps?  no do you drive?  no use a wheelchair transfers alone  Function disabled: date disabled 04/12/2018 I need assistance with the following:  bathing, toileting, meal prep, household duties and shopping  Neuro/Psych bladder control problems weakness numbness tremor tingling trouble walking spasms dizziness confusion  Prior Studies x-rays CT/MRI  Physicians involved in your care Primary care Dr. Victorino Dike Neurologist [facility] [facility] Neurosurgeon [employer] Orthopedist .   Family History  Problem Relation Age of Onset  . Non-Hodgkin's lymphoma Mother   . Hypertension Mother   . Hypertension Father    Social History   Socioeconomic History  .  Marital status: Married    Spouse name: Not on file  . Number of children: Not on file  . Years of education: Not on file  . Highest education level: Not on file  Occupational History  . Not on file  Tobacco Use  . Smoking status: Never Smoker  . Smokeless tobacco: Never Used  Substance and Sexual Activity  . Alcohol use: Never  . Drug use:  Never  . Sexual activity: Not on file  Other Topics Concern  . Not on file  Social History Narrative  . Not on file   Social Determinants of Health   Financial Resource Strain:   . Difficulty of Paying Living Expenses:   Food Insecurity:   . Worried About Programme researcher, broadcasting/film/video in the Last Year:   . Barista in the Last Year:   Transportation Needs:   . Freight forwarder (Medical):   Marland Kitchen Lack of Transportation (Non-Medical):   Physical Activity:   . Days of Exercise per Week:   . Minutes of Exercise per Session:   Stress:   . Feeling of Stress :   Social Connections:   . Frequency of Communication with Friends and Family:   . Frequency of Social Gatherings with Friends and Family:   . Attends Religious Services:   . Active Member of Clubs or Organizations:   . Attends Banker Meetings:   Marland Kitchen Marital Status:    Past Surgical History:  Procedure Laterality Date  . ANKLE ARTHROSCOPY Left    related to Tendon  . ANKLE FUSION Right 12/23/2018   Procedure: right tibiotalar calcaneal arthrodesis;  Surgeon: Toni Arthurs, MD;  Location: [facility] OR;  Service: Orthopedics;  Laterality: Right;   . ANKLE SURGERY Right 12/23/2018  . BACK SURGERY  1971   Spinal Bifida  . BACK SURGERY  2012   fusion  . EXTERNAL FIXATION LEG Right 05/18/2018   Procedure: EXTERNAL FIXATION LEG;  Surgeon: Yolonda Kida, MD;  Location: [facility] OR;  Service: Orthopedics;  Laterality: Right;  . EXTERNAL FIXATION REMOVAL Right 06/14/2018   Procedure: Right ankle external fixator removal with steinman pin placement;  Surgeon: Yolonda Kida, MD;  Location: [facility] OR;  Service: Orthopedics;  Laterality: Right;  60 mins  . HARDWARE REMOVAL Right 05/18/2018   Procedure: HARDWARE REMOVAL;  Surgeon: Yolonda Kida, MD;  Location: [facility] OR;  Service: Orthopedics;  Laterality: Right;  . HARDWARE REMOVAL Right 12/23/2018   Procedure: RIGHT ANKLE REMOVAL OF DEEP IMPLANTS;  Surgeon: Toni Arthurs, MD;  Location: [facility] OR;  Service: Orthopedics;  Laterality: Right;  . KNEE ARTHROSCOPY Left 1983  . ORIF ANKLE FRACTURE Right 04/23/2018   Procedure: OPEN REDUCTION INTERNAL FIXATION (ORIF) TRIMALLEOLAR ANKLE FRACTURE;  Surgeon: Yolonda Kida, MD;  Location: [facility] ORS;  Service: Orthopedics;  Laterality: Right;  120 mins  . ORIF ANKLE FRACTURE Right 05/18/2018   Procedure: Right ankle open reduction internal fixation revision;  Surgeon: Yolonda Kida, MD;  Location: [facility] OR;  Service: Orthopedics;  Laterality: Right;  120 mins  . SHUNT EXTERNALIZATION     Head x 2  . TIBIA OSTEOTOMY Right 12/23/2018   Procedure: RIGHT MEDIAL MALLEOLUS OSTEOTOMY;  Surgeon: Toni Arthurs, MD;  Location: [facility] OR;  Service: Orthopedics;  Laterality: Right;  . WISDOM TOOTH EXTRACTION     Past Medical History:  Diagnosis Date  . Chiari malformation type II (HCC)   . Family history of adverse reaction to anesthesia  grandfather died from anesthesia; grandfather had malignant hyperthermia  . GERD (gastroesophageal reflux disease)   . Hydrocephalus ([place])   . Malignant hyperthermia   . Neuromuscular disorder (HCC)    Neuropathy  . Osteoarthritis   . PONV (postoperative nausea and vomiting)   . Spina bifida ([place])    Temp 98.5 F (36.9 C)   Ht 5' (1.524 m)   Wt 290 lb (131.5 kg)   BMI 56.64 kg/m   Opioid Risk Score:   Fall Risk Score:  `1  Depression screen PHQ 2/9  No flowsheet data found.  Review of Systems  Constitutional: Positive for unexpected weight change.  Respiratory: Positive for shortness of breath.   Genitourinary:       Bladder contol  Musculoskeletal: Positive for gait problem.  Neurological: Positive for dizziness, tremors, weakness and numbness.       Tingling spasms  Psychiatric/Behavioral: Positive for confusion.  All other systems reviewed and are negative.      Objective:   Physical Exam Vitals and nursing note reviewed.  Constitutional:      Appearance:  She is obese.  HENT:     Head: Normocephalic and atraumatic.  Eyes:     Extraocular Movements: Extraocular movements intact.     Conjunctiva/sclera: Conjunctivae normal.     Pupils: Pupils are equal, round, and reactive to light.  Cardiovascular:     Rate and Rhythm: Normal rate and regular rhythm.     Heart sounds: Normal heart sounds. No murmur.  Pulmonary:     Effort: Pulmonary effort is normal. No respiratory distress.     Breath sounds: Normal breath sounds. No stridor.  Abdominal:     General: Bowel sounds are normal.     Palpations: Abdomen is soft.  Musculoskeletal:     Cervical back: Normal range of motion.     Right ankle: No tenderness. Decreased range of motion.     Right Achilles Tendon: No tenderness.     Right foot: Decreased range of motion. Swelling present.     Comments: The patient could not stand to examine lumbosacral area. She had functional range of motion at the hips and knees bilaterally as well as left foot and ankle Right toes could be with cold Fused at the ankle unable to dorsiflex plantarflex invert or evert.   Neurological:     Mental Status: She is alert and oriented to person, place, and time.     Comments: Sensation reduced below the knee in the fibular nerve and tibial nerve distribution on the right side. There is no hypersensitivity to touch There is no evidence of skin discoloration, no dystrophic changes in nails, no evidence of abnormal sweating or dryness    Psychiatric:        Mood and Affect: Mood normal.        Behavior: Behavior normal.        Thought Content: Thought content normal.        Judgment: Judgment normal.           Assessment & Plan:   1.  Decline in self-care and mobility following right trimalleolar fracture sustained in 2019.  She has had multiple surgeries and now is fused at the right ankle. The patient has lost mobility since her discharge from inpatient rehabilitation.  She was using a sliding board for  transfers.  I do think she would benefit from additional outpatient PT OT. She also may require home modifications to allow for bathroom accessibility.  She also  states she cannot get into her bedroom.  We will have to see whether a manual chair can be used to get her in and out of her bedroom or bathroom. Will see how she does with her outpatient PT OT. 2.  Neurogenic pain.  Appears to be distal sciatic injury rather than isolated peroneal given that she has numbness on the bottom of the foot as well as on the top. Because of the significant amount of swelling it would be difficult technically to get a reliable NCV although a needle study may be possible. While patient has swelling and pain I do not think she has other elements consistent with reflex sympathetic dystrophy or complex regional pain syndrome. Will increase pregabalin dose to 75 mg twice daily Follow-up in 4 to 6 weeks with physical medicine rehab

## 2020-04-03 NOTE — Patient Instructions (Signed)
PT, OT will call you for appts

## 2020-04-10 ENCOUNTER — Telehealth: Payer: Self-pay | Admitting: *Deleted

## 2020-04-10 MED ORDER — PREGABALIN 100 MG PO CAPS: 100.0000 mg | ORAL_CAPSULE | Freq: Three times a day (TID) | ORAL | 0 refills | Status: DC

## 2020-04-10 NOTE — Telephone Encounter (Signed)
Don't remember her saying that , not in my not, regardless may then increase Lyrica to 100mg  TID

## 2020-04-10 NOTE — Telephone Encounter (Signed)
Order phoned in, patient notified

## 2020-04-10 NOTE — Telephone Encounter (Signed)
Patient left a message stating she was recently seen by Dr. Wynn Banker. According to patient, increase in Lyrica was discussed.  Patient states she was previously taking 100 mg BID (two 50 mg capsules twice a day) and now she was prescribed 75 mg capsule twice a day by Dr. Wynn Banker.  Patient states that this is a decrease not an increase

## 2020-05-01 ENCOUNTER — Encounter: Payer: Medicaid Other | Admitting: Physical Medicine & Rehabilitation

## 2020-05-21 ENCOUNTER — Telehealth: Payer: Self-pay

## 2020-05-21 MED ORDER — PREGABALIN 100 MG PO CAPS: 100.0000 mg | ORAL_CAPSULE | Freq: Three times a day (TID) | ORAL | 0 refills | Status: DC

## 2020-05-21 NOTE — Telephone Encounter (Signed)
Dr. Kirtland Bouchard I had to move [name] out a few days to a spot big enough for an afternoon EMG for you this week - she said her Lyrica is going to run out if you can refill - last refilled May 11 -- she notified work comp of her appt change also.

## 2020-05-21 NOTE — Telephone Encounter (Signed)
Notified Ms Gosdin.

## 2020-05-24 ENCOUNTER — Encounter: Payer: No Typology Code available for payment source | Admitting: Physical Medicine & Rehabilitation

## 2020-06-08 ENCOUNTER — Encounter
Payer: No Typology Code available for payment source | Attending: Physical Medicine & Rehabilitation | Admitting: Physical Medicine & Rehabilitation

## 2020-06-08 ENCOUNTER — Other Ambulatory Visit: Payer: Self-pay

## 2020-06-08 ENCOUNTER — Encounter: Payer: Self-pay | Admitting: Physical Medicine & Rehabilitation

## 2020-06-08 VITALS — BP 131/81 | HR 97 | Temp 98.4°F

## 2020-06-08 DIAGNOSIS — M19171 Post-traumatic osteoarthritis, right ankle and foot: Secondary | ICD-10-CM | POA: Diagnosis not present

## 2020-06-08 DIAGNOSIS — Q052 Lumbar spina bifida with hydrocephalus: Secondary | ICD-10-CM | POA: Insufficient documentation

## 2020-06-08 DIAGNOSIS — G8928 Other chronic postprocedural pain: Secondary | ICD-10-CM | POA: Diagnosis present

## 2020-06-08 DIAGNOSIS — G5701 Lesion of sciatic nerve, right lower limb: Secondary | ICD-10-CM | POA: Diagnosis present

## 2020-06-08 NOTE — Progress Notes (Signed)
Subjective:    Patient ID: [name], female    DOB: 1971-11-10, 49 y.o.   MRN: [id number]  female with history of spina bifida and type II Chiari malformation with who is referred by Dr. Victorino Dike for right ankle and foot pain.  The patient had a work related injury when she was employed at [employer] on 04/12/2018.  The patient  presented to the [facility] emergency department. She underwent closed reduction of the fracture dislocation. The patient followed up with Dr. Duwayne Heck and underwent right ankle open reduction internal fixation of a trimalleolar fracture. The patient was seen at: Inpatient rehab unit, Dr. Ivory Broad was the attending physician.  The patient was nonweightbearing.  On admission to rehab required mod assist stand pivot transfers mod to max with ADLs.  Upon discharge from rehab the patient required supervision for car transfers with sliding board.  Modified independent upper extremity ADLs but did require assistance for lower body ADLs. Has had follow up with ortho foot and ankle Dr Victorino Dike fusion is stable  Pt received home health PT after CIR admission.  The patient and husband state that she has not been able to access the bathroom with her scooter.  Rehab discharge summary indicated that she could access the bathroom but it did indicate whether this was with a wheelchair. On 05/18/2018 patient had hardware removal due to loss of fixation with placement of external fixator. On 06/15/2019 was removal of external fixator with placement of Steinmann pin On 12/27/2018 patient underwent right ankle tibiotalar calcaneal arthrodesis by Dr. Victorino Dike. Numbness in RIght lower ext, swelling, problems with toilet and bed transfer.  Feels numbness came on after the external fixator placement   No increased weakness in the LLE    Allowed WB, tolerates for short periods of time,    Used to walk with walker prior to the fracture    Bed is too high, unable to get through doorways  with scooter   Unable to get into tub Still taking "bird baths"   Stays in recliner "24/7", 2 steps in and out of house with a ramp/     On Pregabalin for leg pain , on Cymbalta for neuropathy in hands and feet.    Hx Spina Bifida with Chiari 2 malformation , hx of hydrocephalus and shunting Seen by neurosurgery and repeat spine MRI reportedly without changes   Urinary urgency since ankle injury    HPI Chief complaint right lower extremity weakness 49 year old female with prior history of spina bifida and Chiari malformation who suffered a work-related trimalleolar fracture right ankle who has had progressive debility after multiple surgical procedures to stabilize the right ankle.  She ultimately underwent arthrodesis per Dr. Victorino Dike. She no longer can get in and out of bed.  She has difficulty lifting the right foot off the ground and also extending the right knee.  She has no new numbness or tingling above the knee she has chronic numbness and tingling below the knee including the top and bottom of her foot.  Since last visit she has had a televisit with neurology Spells lasting ~78min, shaking, sometimes spells of, started last summer.  Long-term EEG monitoring for 3 days is planned.   Still taking Lyrica 100 mg 3 times daily.   Pain Inventory Average Pain 1 Pain Right Now 1 My pain is intermittent  In the last 24 hours, has pain interfered with the following? General activity 1 Relation with others 1 Enjoyment of life 1  What TIME of day is your pain at its worst? varies Sleep (in general) Fair  Pain is worse with: walking, bending, standing and some activites Pain improves with: rest Relief from Meds: 2  Mobility use a wheelchair transfers alone  Function disabled: date disabled ss  Neuro/Psych bladder control problems weakness numbness tremor tingling trouble walking spasms dizziness confusion  Prior Studies Any changes since last visit?   no  Physicians involved in your care Any changes since last visit?  no   Family History  Problem Relation Age of Onset   Non-Hodgkin's lymphoma Mother    Hypertension Mother    Hypertension Father    Social History   Socioeconomic History   Marital status: Married    Spouse name: Not on file   Number of children: Not on file   Years of education: Not on file   Highest education level: Not on file  Occupational History   Not on file  Tobacco Use   Smoking status: Never Smoker   Smokeless tobacco: Never Used  Vaping Use   Vaping Use: Never used  Substance and Sexual Activity   Alcohol use: Never   Drug use: Never   Sexual activity: Not on file  Other Topics Concern   Not on file  Social History Narrative   Not on file   Social Determinants of Health   Financial Resource Strain:    Difficulty of Paying Living Expenses:   Food Insecurity:    Worried About Programme researcher, broadcasting/film/videounning Out of Food in the Last Year:    Baristaan Out of Food in the Last Year:   Transportation Needs:    Freight forwarderLack of Transportation (Medical):    Lack of Transportation (Non-Medical):   Physical Activity:    Days of Exercise per Week:    Minutes of Exercise per Session:   Stress:    Feeling of Stress :   Social Connections:    Frequency of Communication with Friends and Family:    Frequency of Social Gatherings with Friends and Family:    Attends Religious Services:    Active Member of Clubs or Organizations:    Attends BankerClub or Organization Meetings:    Marital Status:    Past Surgical History:  Procedure Laterality Date   ANKLE ARTHROSCOPY Left    related to Tendon   ANKLE FUSION Right 12/23/2018   Procedure: right tibiotalar calcaneal arthrodesis;  Surgeon: Toni ArthursHewitt, John, MD;  [facility];  Service: Orthopedics;  Laterality: Right;  180min   ANKLE SURGERY Right 12/23/2018   BACK SURGERY  1971   Spinal Bifida   BACK SURGERY  2012   fusion   EXTERNAL FIXATION LEG Right  05/18/2018   Procedure: EXTERNAL FIXATION LEG;  Surgeon: Yolonda Kidaogers, Jason Patrick, MD;  [facility];  Service: Orthopedics;  Laterality: Right;   EXTERNAL FIXATION REMOVAL Right 06/14/2018   Procedure: Right ankle external fixator removal with steinman pin placement;  Surgeon: Yolonda Kidaogers, Jason Patrick, MD;  [facility];  Service: Orthopedics;  Laterality: Right;  60 mins   HARDWARE REMOVAL Right 05/18/2018   Procedure: HARDWARE REMOVAL;  Surgeon: Yolonda Kidaogers, Jason Patrick, MD;  [facility];  Service: Orthopedics;  Laterality: Right;   HARDWARE REMOVAL Right 12/23/2018   Procedure: RIGHT ANKLE REMOVAL OF DEEP IMPLANTS;  Surgeon: Toni ArthursHewitt, John, MD;  Location: [facility] OR;  Service: Orthopedics;  Laterality: Right;   KNEE ARTHROSCOPY Left 1983   ORIF ANKLE FRACTURE Right 04/23/2018   Procedure: OPEN REDUCTION INTERNAL FIXATION (ORIF) TRIMALLEOLAR ANKLE  FRACTURE;  Surgeon: Yolonda Kida, MD;  Location: [facility] ORS;  Service: Orthopedics;  Laterality: Right;  120 mins   ORIF ANKLE FRACTURE Right 05/18/2018   Procedure: Right ankle open reduction internal fixation revision;  Surgeon: Yolonda Kida, MD;  Location: [facility] OR;  Service: Orthopedics;  Laterality: Right;  120 mins   SHUNT EXTERNALIZATION     Head x 2   TIBIA OSTEOTOMY Right 12/23/2018   Procedure: RIGHT MEDIAL MALLEOLUS OSTEOTOMY;  Surgeon: Toni Arthurs, MD;  Location: [facility] OR;  Service: Orthopedics;  Laterality: Right;   WISDOM TOOTH EXTRACTION     Past Medical History:  Diagnosis Date   Chiari malformation type II (HCC)    Family history of adverse reaction to anesthesia    grandfather died from anesthesia; grandfather had malignant hyperthermia   GERD (gastroesophageal reflux disease)    Hydrocephalus (HCC)    Malignant hyperthermia    Neuromuscular disorder (HCC)    Neuropathy   Osteoarthritis    PONV (postoperative nausea and vomiting)    Spina bifida (HCC)    BP 131/81    Pulse 97    Temp 98.4 F (36.9 C)     SpO2 96%   Opioid Risk Score:   Fall Risk Score:  `1  Depression screen PHQ 2/9  Depression screen PHQ 2/9 04/03/2020  Decreased Interest 2  Down, Depressed, Hopeless 2  PHQ - 2 Score 4  Altered sleeping 2  Tired, decreased energy 2  Change in appetite 2  Feeling bad or failure about yourself  2  Trouble concentrating 2  Moving slowly or fidgety/restless 0  Suicidal thoughts 0  PHQ-9 Score 14  Difficult doing work/chores Extremely dIfficult    Review of Systems  Constitutional: Negative.   HENT: Negative.   Eyes: Negative.   Respiratory: Negative.   Cardiovascular: Negative.   Gastrointestinal: Negative.   Endocrine: Negative.   Genitourinary: Positive for urgency.       Neurogenic   Musculoskeletal: Positive for gait problem.  Skin: Negative.   Allergic/Immunologic: Negative.   Neurological: Positive for seizures, weakness and numbness.  Psychiatric/Behavioral: Positive for confusion.       Objective:   Physical Exam Vitals and nursing note reviewed.  Constitutional:      Appearance: She is obese.  HENT:     Head: Normocephalic and atraumatic.  Eyes:     Extraocular Movements: Extraocular movements intact.     Conjunctiva/sclera: Conjunctivae normal.     Pupils: Pupils are equal, round, and reactive to light.  Musculoskeletal:     Right lower leg: Edema present.     Left lower leg: Edema present.     Comments: Right greater than left lower extremity edema  Skin:    General: Skin is warm and dry.  Neurological:     Mental Status: She is alert and oriented to person, place, and time.  Psychiatric:        Mood and Affect: Mood normal.        Behavior: Behavior normal.        Thought Content: Thought content normal.    Motor strength is 3 - at the right hip flexor knee extensor trace ankle dorsiflexor plantar flexor limited by right ankle arthrodesis 4 - at the left hip flexor knee extensor ankle dorsiflexor and plantar flexor       Assessment &  Plan:  #1.  History of trimalleolar fracture with arthrodesis.  The patient has had several surgical procedures and did not have adequate  rehabilitation in between each procedure so that she became progressively debilitated.  We discussed that with longer term outpatient PT OT she has the potential to improve right lower extremity strength then transfers to independent, perform sliding board transfers as well as upper extremity ADLs.  She will still likely need some assistance with lower body ADLs.  She would benefit from OT evaluation for assistive devices. In terms of her right lower extremity I do think she would benefit from a CircAid compression wrap that includes the foot as well as the leg.  This should be available through [facility].  I discussed goals with patient and her husband.  They agree with the plan.  I have made a referral to: [facility] rehab on [address] I will reevaluate in 3 months

## 2020-06-22 ENCOUNTER — Telehealth: Payer: Self-pay | Admitting: *Deleted

## 2020-06-22 NOTE — Telephone Encounter (Signed)
Andris Flurry NCM called to say she approved the initial PT/OTon Ms Difatta. She is needing an order for the compression wrap if you need for wkman comp to approve.  Her fax # is  [phone]

## 2020-07-04 ENCOUNTER — Other Ambulatory Visit: Payer: Self-pay

## 2020-07-04 ENCOUNTER — Ambulatory Visit
Payer: No Typology Code available for payment source | Attending: Physical Medicine & Rehabilitation | Admitting: Physical Therapy

## 2020-07-04 DIAGNOSIS — M6281 Muscle weakness (generalized): Secondary | ICD-10-CM | POA: Insufficient documentation

## 2020-07-04 DIAGNOSIS — R600 Localized edema: Secondary | ICD-10-CM

## 2020-07-04 DIAGNOSIS — R2689 Other abnormalities of gait and mobility: Secondary | ICD-10-CM | POA: Insufficient documentation

## 2020-07-04 DIAGNOSIS — M25671 Stiffness of right ankle, not elsewhere classified: Secondary | ICD-10-CM | POA: Diagnosis present

## 2020-07-04 DIAGNOSIS — M25571 Pain in right ankle and joints of right foot: Secondary | ICD-10-CM | POA: Diagnosis present

## 2020-07-04 NOTE — Therapy (Signed)
[facility] [address] [place], [place], [address] Phone: [phone]   Fax:  [phone]  Physical Therapy Evaluation  Patient Details  Name: ALEXSANDRA SHONTZ MRN: [id number] Date of Birth: Feb 18, 1971 Referring Provider (PT): Wynn Banker Victorino Sparrow, MD   Encounter Date: 07/04/2020   PT End of Session - 07/04/20 1642    Visit Number 1    Number of Visits 1    Authorization Type Worker's Comp    Authorization Time Period Approval only for initial evaluation; Field case manager: Andris Flurry  (ph : [phone], fax: [phone])    Authorization - Visit Number 1    Authorization - Number of Visits 1    PT Start Time 1538    PT Stop Time 1620    PT Time Calculation (min) 42 min    Activity Tolerance Patient tolerated treatment well    Behavior During Therapy [facility] for tasks assessed/performed           Past Medical History:  Diagnosis Date  . Chiari malformation type II (HCC)   . Family history of adverse reaction to anesthesia    grandfather died from anesthesia; grandfather had malignant hyperthermia  . GERD (gastroesophageal reflux disease)   . Hydrocephalus (HCC)   . Malignant hyperthermia   . Neuromuscular disorder (HCC)    Neuropathy  . Osteoarthritis   . PONV (postoperative nausea and vomiting)   . Spina bifida [facility])     Past Surgical History:  Procedure Laterality Date  . ANKLE ARTHROSCOPY Left    related to Tendon  . ANKLE FUSION Right 12/23/2018   Procedure: right tibiotalar calcaneal arthrodesis;  Surgeon: Toni Arthurs, MD;  Location: [facility] OR;  Service: Orthopedics;  Laterality: Right;   . ANKLE SURGERY Right 12/23/2018  . BACK SURGERY  1971   Spinal Bifida  . BACK SURGERY  2012   fusion  . EXTERNAL FIXATION LEG Right 05/18/2018   Procedure: EXTERNAL FIXATION LEG;  Surgeon: Yolonda Kida, MD;  Location: [facility] OR;  Service: Orthopedics;  Laterality: Right;  . EXTERNAL FIXATION REMOVAL Right  06/14/2018   Procedure: Right ankle external fixator removal with steinman pin placement;  Surgeon: Yolonda Kida, MD;  Location: [facility] OR;  Service: Orthopedics;  Laterality: Right;  60 mins  . HARDWARE REMOVAL Right 05/18/2018   Procedure: HARDWARE REMOVAL;  Surgeon: Yolonda Kida, MD;  Location: [facility] OR;  Service: Orthopedics;  Laterality: Right;  . HARDWARE REMOVAL Right 12/23/2018   Procedure: RIGHT ANKLE REMOVAL OF DEEP IMPLANTS;  Surgeon: Toni Arthurs, MD;  Location: [facility] OR;  Service: Orthopedics;  Laterality: Right;  . KNEE ARTHROSCOPY Left 1983  . ORIF ANKLE FRACTURE Right 04/23/2018   Procedure: OPEN REDUCTION INTERNAL FIXATION (ORIF) TRIMALLEOLAR ANKLE FRACTURE;  Surgeon: Yolonda Kida, MD;  Location: [facility] ORS;  Service: Orthopedics;  Laterality: Right;  120 mins  . ORIF ANKLE FRACTURE Right 05/18/2018   Procedure: Right ankle open reduction internal fixation revision;  Surgeon: Yolonda Kida, MD;  Location: [facility] OR;  Service: Orthopedics;  Laterality: Right;  120 mins  . SHUNT EXTERNALIZATION     Head x 2  . TIBIA OSTEOTOMY Right 12/23/2018   Procedure: RIGHT MEDIAL MALLEOLUS OSTEOTOMY;  Surgeon: Toni Arthurs, MD;  Location: [facility] OR;  Service: Orthopedics;  Laterality: Right;  . WISDOM TOOTH EXTRACTION      There were no vitals filed for this visit.    Subjective Assessment - 07/04/20 1541    Subjective Patient reports prior  ankle surgeries following injury at work, ultimately she has right ankle fusion in 2020. Currently she has significant difficulty standing, walking, and transfering due to right ankle. She reports progressive right ankle and leg weakness, and states that her right ankle and lower leg feels dead because she has trouble feeling anything below the knee on the right side. She has a lot of trouble lifting her right leg and straightening her right knee which limits her ability to get into her bed, so she has been stuck in her recliner for the past few  years. She uses a motorized scooter for household and community mobility, and she transfers using a stand pivot technique with rolling walker.    Pertinent History Spina bifida of lumbosacral region with hydrocephalus, history of Chiari malformation, pevious ankle surgeries    Limitations Standing;House hold activities;Walking    How long can you sit comfortably? No limitation    How long can you stand comfortably? < 1 minute    How long can you walk comfortably? < 1 minute    Patient Stated Goals Improve ability to walk and move    Currently in Pain? Yes    Pain Score 0-No pain    Pain Location Ankle    Pain Orientation Right    Pain Descriptors / Indicators Aching;Tightness    Pain Type Chronic pain    Pain Onset More than a month ago    Pain Frequency Intermittent    Aggravating Factors  Any weightbearing task    Pain Relieving Factors Rest    Effect of Pain on Daily Activities Patient is limited with standing, walking, general mobility              [facility] PT Assessment - 07/04/20 0001      Assessment   Medical Diagnosis Post-traumatic arthritis of ankle, right    Referring Provider (PT) Wynn Banker, Victorino Sparrow, MD    Onset Date/Surgical Date 04/12/18    Next MD Visit 09/07/2020    Prior Therapy Yes - [facility]      Precautions   Precautions None      Restrictions   Weight Bearing Restrictions No      Balance Screen   Has the patient fallen in the past 6 months No      Home Environment   Living Environment Private residence    Living Arrangements Spouse/significant other      Prior Function   Level of Independence Requires assistive device for independence;Needs assistance with homemaking;Independent with household mobility with device;Independent with community mobility with device    Leisure None reported      Cognition   Overall Cognitive Status Within Functional Limits for tasks assessed      Observation/Other Assessments   Observations Patient appears in no apparent  distress, she is using a motorized scooter for mobility    Focus on Therapeutic Outcomes (FOTO)  NA - 1 time visit      Observation/Other Assessments-Edema    Edema --   Patient exhibit bilat foot/lower leg edema, greater on right     Sensation   Light Touch Impaired by gross assessment    Additional Comments Patient reports impaired sensation to right lower leg below the knee, most sensation loss noted posteriorly and plantar surface of foot      Coordination   Gross Motor Movements are Fluid and Coordinated Yes      ROM / Strength   AROM / PROM / Strength PROM;AROM;Strength      AROM  Overall AROM Comments Right ankle AROM grossly limited in all directions due to previous ankle fusion      PROM   Overall PROM Comments Hip and knee PROM grossly WFL and equal bilaterally, limited mainly by soft tissue block      Strength   Overall Strength Comments Ankle not assessed due to limitation in ankle motion    Strength Assessment Site Knee;Hip    Right/Left Hip Right;Left    Right Hip Flexion 3-/5    Right Hip Extension 3-/5    Right Hip ABduction 3-/5    Left Hip Flexion 4/5    Left Hip Extension 3-/5    Left Hip ABduction 3-/5    Right/Left Knee Right;Left    Right Knee Flexion 4-/5    Right Knee Extension 3-/5    Left Knee Flexion 4+/5    Left Knee Extension 4+/5      Flexibility   Soft Tissue Assessment /Muscle Length no      Palpation   Patella mobility Not assessed    Palpation comment Not assessed      Special Tests   Other special tests Not assessed      Bed Mobility   Bed Mobility --   Patient independent with bed mobility     Transfers   Transfers Stand Pivot Transfers    Stand Pivot Transfers 6: Modified independent (Device/Increase time)      Ambulation/Gait   Ambulation/Gait No    Gait Comments Ambulation not assessed this visit                      Objective measurements completed on examination: See above findings.       [facility]  Adult PT Treatment/Exercise - 07/04/20 0001      Exercises   Exercises Knee/Hip      Knee/Hip Exercises: Standing   Other Standing Knee Exercises Lateral weight shifts x 1 min with BUE support      Knee/Hip Exercises: Seated   Long Arc Quad 10 reps    Other Seated Knee/Hip Exercises Ankle pumps and AROM all directions    Marching 10 reps      Knee/Hip Exercises: Supine   Heel Slides 10 reps    Other Supine Knee/Hip Exercises Hip abduction/adduction x10    Other Supine Knee/Hip Exercises Hooklying marching x10                  PT Education - 07/04/20 1642    Education Details Exam findings, POC consisting of eval only due to worker's comp, HEP, walking progression    Person(s) Educated Patient    Methods Explanation;Demonstration;Tactile cues;Verbal cues;Handout    Comprehension Verbalized understanding;Returned demonstration;Verbal cues required;Tactile cues required;Need further instruction            PT Short Term Goals - 07/04/20 1659      PT SHORT TERM GOAL #1   Title Patient will be I with HEP to improve strength and mobility    Baseline Patient demonstrated HEP appropriately and expressed understanding    Status Achieved                     Plan - 07/04/20 1645    Clinical Impression Statement Patient is a 49y/o female who presents to PT with history of right ankle fusion following injury at work. Currently she exhibits good range of motion of the hips and knees, mostly limited by soft tissue block, but she demonstrates significant weakness  of the right hip and knee that is likely secondary to deconditioning from not standing or walking following her right ankle surgeries. She exhibits limited motion of the right ankle secondary to her fusion, and has difficulty with all weight bearing tasks. She was able to perform stand pivot transfer using the rolling walker from her scooter to mat table. She was independent with bed mobility once on the mat table,  but due to how high her bed is at home she is unable to transfer to her bed and thus is sleeping in her recliner. The patient was provided with exercises to inititate strengthening for the LE and improve weight bearing tolerance. Due to worker's comp, this was a 1-time visit, but the patient would benefit from continued skilled PT in order to progress her strength and mobility in order to allow for household ambulation using a rolling walker, and improving independence with ADLs and tasks such as bathing and dressing.    Personal Factors and Comorbidities Fitness;Past/Current Experience;Time since onset of injury/illness/exacerbation;Comorbidity 3+;Transportation    Comorbidities Spina bifida of lumbosacral region with hydrocephalus, history of Chiari malformation, pevious ankle surgeries, BMI, chronic back pain    Examination-Activity Limitations Bathing;Locomotion Level;Transfers;Dressing;Hygiene/Grooming;Stand;Stairs;Squat;Sleep    Examination-Participation Restrictions Meal Prep;Cleaning;Occupation;Community Activity;Driving;Shop;Laundry;Yard Work    Stability/Clinical Decision Making Evolving/Moderate complexity    Clinical Decision Making Moderate    Rehab Potential Good    PT Frequency One time visit    PT Treatment/Interventions ADLs/Self Care Home Management;Cryotherapy;Electrical Stimulation;Iontophoresis 4mg /ml Dexamethasone;Moist Heat;Neuromuscular re-education;Balance training;Therapeutic exercise;Therapeutic activities;Functional mobility training;Stair training;Gait training;Patient/family education;Manual techniques;Passive range of motion;Dry needling;Joint Manipulations;Taping;Vasopneumatic Device    PT Next Visit Plan NA - one time visit    PT Home Exercise Plan [email or web address]: ankle pumps (ankle AROM all directions), supine heel slide, supine marching, supine hip abduction/adduction, LAQ, seated marching, standing lateral weight shifts    Consulted and Agree with Plan of Care Patient            Patient will benefit from skilled therapeutic intervention in order to improve the following deficits and impairments:  Abnormal gait, Difficulty walking, Decreased activity tolerance, Pain, Decreased strength, Decreased mobility, Increased edema  Visit Diagnosis: Pain in right ankle and joints of right foot  Stiffness of right ankle, not elsewhere classified  Muscle weakness (generalized)  Other abnormalities of gait and mobility  Localized edema     Problem List Patient Active Problem List   Diagnosis Date Noted  . Post-traumatic arthritis of ankle, right 12/23/2018  . Closed right ankle fracture 06/14/2018  . Hypoalbuminemia due to protein-calorie malnutrition (HCC)   . Acute blood loss anemia   . 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 History of Chiari malformation   . Trimalleolar fracture of ankle, closed, left, initial encounter 04/29/2018  . Closed displaced trimalleolar fracture of right ankle 04/23/2018    Rosana Hoesampbell Karandeep Resende, PT, DPT, LAT, ATC 07/04/20  5:25 PM Phone: [phone] Fax: [phone]   [facility] [address] [place], [place], [address] Phone: [phone]   Fax:  [phone]  Name: Shirline FreesBarbara L Milazzo MRN: [id number] Date of Birth: 01/04/1971

## 2020-07-04 NOTE — Patient Instructions (Signed)
Access Code: [email or web address] URL: [email or web address] Date: 07/04/2020 Prepared by: Rosana Hoes  Exercises Supine Ankle Pumps - 2-3 x daily - 7 x weekly - 20 hold Supine Ankle Inversion Eversion AROM - 2-3 x daily - 7 x weekly - 20 hold Supine Heel Slide - 2-3 x daily - 7 x weekly - 10 reps Supine Knee to Chest with Leg Straight - 2-3 x daily - 7 x weekly - 10 reps Supine Hip Abduction - 2-3 x daily - 7 x weekly - 10 reps Seated Long Arc Quad - 2-3 x daily - 7 x weekly - 10 reps Seated March - 2-3 x daily - 7 x weekly - 10 reps Side to Side Weight Shift with Counter Support - 2-3 x daily - 7 x weekly - 1 minute hold

## 2020-07-23 ENCOUNTER — Telehealth: Payer: Self-pay

## 2020-07-23 NOTE — Telephone Encounter (Signed)
Right ankle contracture Hx of Spina Bifida

## 2020-07-23 NOTE — Telephone Encounter (Signed)
Patient states OT was not approved by [facility] Outpatient therapy because the coding does not reflect the need for OT - can you look at what you dropped and see if there is a different way to code for the "possibility" of other assistive devices - let me know if you see something different to add

## 2020-07-28 ENCOUNTER — Encounter: Payer: Self-pay | Admitting: Physical Therapy

## 2020-07-28 ENCOUNTER — Ambulatory Visit
Payer: No Typology Code available for payment source | Attending: Physical Medicine & Rehabilitation | Admitting: Physical Therapy

## 2020-07-28 ENCOUNTER — Other Ambulatory Visit: Payer: Self-pay

## 2020-07-28 DIAGNOSIS — R2689 Other abnormalities of gait and mobility: Secondary | ICD-10-CM | POA: Insufficient documentation

## 2020-07-28 DIAGNOSIS — M6281 Muscle weakness (generalized): Secondary | ICD-10-CM | POA: Diagnosis present

## 2020-07-28 DIAGNOSIS — R600 Localized edema: Secondary | ICD-10-CM | POA: Diagnosis present

## 2020-07-28 DIAGNOSIS — M25571 Pain in right ankle and joints of right foot: Secondary | ICD-10-CM | POA: Diagnosis present

## 2020-07-28 DIAGNOSIS — M25671 Stiffness of right ankle, not elsewhere classified: Secondary | ICD-10-CM | POA: Insufficient documentation

## 2020-07-28 NOTE — Therapy (Signed)
[facility] Outpatient Rehabilitation [facility] [address] [place], [place], [address] Phone: [phone]   Fax:  [phone]  Physical Therapy Treatment / Update POC  Patient Details  Name: [name] MRN: [id number] Date of Birth: October 29, 1971 Referring Provider (PT): Erick Colace, MD   Encounter Date: 07/28/2020   PT End of Session - 07/28/20 1050    Visit Number 2    Number of Visits 13    Date for PT Re-Evaluation 09/08/20    Authorization Type Worker's Comp    Authorization Time Period 12 visits (2x/week for 6 weeks)    Authorization - Visit Number 1    Authorization - Number of Visits 12    PT Start Time 1042    PT Stop Time 1117    PT Time Calculation (min) 35 min    Activity Tolerance Patient tolerated treatment well    Behavior During Therapy [facility] for tasks assessed/performed           Past Medical History:  Diagnosis Date  . Chiari malformation type II (HCC)   . Family history of adverse reaction to anesthesia    grandfather died from anesthesia; grandfather had malignant hyperthermia  . GERD (gastroesophageal reflux disease)   . Hydrocephalus (HCC)   . Malignant hyperthermia   . Neuromuscular disorder (HCC)    Neuropathy  . Osteoarthritis   . PONV (postoperative nausea and vomiting)   . Spina bifida [facility])     Past Surgical History:  Procedure Laterality Date  . ANKLE ARTHROSCOPY Left    related to Tendon  . ANKLE FUSION Right 12/23/2018   Procedure: right tibiotalar calcaneal arthrodesis;  Surgeon: Toni Arthurs, MD;  Location: [facility] OR;  Service: Orthopedics;  Laterality: Right;   . ANKLE SURGERY Right 12/23/2018  . BACK SURGERY  1971   Spinal Bifida  . BACK SURGERY  2012   fusion  . EXTERNAL FIXATION LEG Right 05/18/2018   Procedure: EXTERNAL FIXATION LEG;  Surgeon: Yolonda Kida, MD;  Location: [facility] OR;  Service: Orthopedics;  Laterality: Right;  . EXTERNAL FIXATION REMOVAL Right 06/14/2018   Procedure:  Right ankle external fixator removal with steinman pin placement;  Surgeon: Yolonda Kida, MD;  Location: [facility] OR;  Service: Orthopedics;  Laterality: Right;  60 mins  . HARDWARE REMOVAL Right 05/18/2018   Procedure: HARDWARE REMOVAL;  Surgeon: Yolonda Kida, MD;  Location: [facility] OR;  Service: Orthopedics;  Laterality: Right;  . HARDWARE REMOVAL Right 12/23/2018   Procedure: RIGHT ANKLE REMOVAL OF DEEP IMPLANTS;  Surgeon: Toni Arthurs, MD;  Location: [facility] OR;  Service: Orthopedics;  Laterality: Right;  . KNEE ARTHROSCOPY Left 1983  . ORIF ANKLE FRACTURE Right 04/23/2018   Procedure: OPEN REDUCTION INTERNAL FIXATION (ORIF) TRIMALLEOLAR ANKLE FRACTURE;  Surgeon: Yolonda Kida, MD;  Location: [facility] ORS;  Service: Orthopedics;  Laterality: Right;  120 mins  . ORIF ANKLE FRACTURE Right 05/18/2018   Procedure: Right ankle open reduction internal fixation revision;  Surgeon: Yolonda Kida, MD;  Location: [facility] OR;  Service: Orthopedics;  Laterality: Right;  120 mins  . SHUNT EXTERNALIZATION     Head x 2  . TIBIA OSTEOTOMY Right 12/23/2018   Procedure: RIGHT MEDIAL MALLEOLUS OSTEOTOMY;  Surgeon: Toni Arthurs, MD;  Location: [facility] OR;  Service: Orthopedics;  Laterality: Right;  . WISDOM TOOTH EXTRACTION      There were no vitals filed for this visit.   Subjective Assessment - 07/28/20 1047    Subjective Patient reports she  is a little dizzy from doing a 3-day EEG. She does note that she fell this morning when trying to pivot to her bedside toilet, she did not sustain any injuries. She does note 1 other fall but cannot remember if it was prior to the evaluation. She has not been able to some of the exercises.    Pertinent History Spina bifida of lumbosacral region with hydrocephalus, history of Chiari malformation, pevious ankle surgeries    Limitations Standing;House hold activities;Walking    How long can you stand comfortably? < 1 minute    How long can you walk comfortably? < 1 minute      Patient Stated Goals Improve ability to walk and stand in household    Currently in Pain? No/denies              [facility] PT Assessment - 07/28/20 0001      Assessment   Medical Diagnosis Post-traumatic arthritis of ankle, right    Referring Provider (PT) Wynn Banker Victorino Sparrow, MD    Onset Date/Surgical Date 04/12/18    Hand Dominance Right    Next MD Visit 09/07/2020    Prior Therapy Yes - [facility]PT      Precautions   Precautions Fall    Precaution Comments Patient reports frequent falls      Restrictions   Weight Bearing Restrictions No      Balance Screen   Has the patient fallen in the past 6 months Yes    How many times? Patient reports 2 falls within past month    Has the patient had a decrease in activity level because of a fear of falling?  Yes    Is the patient reluctant to leave their home because of a fear of falling?  Yes      Prior Function   Level of Independence Requires assistive device for independence;Needs assistance with homemaking;Independent with household mobility with device;Independent with community mobility with device    Leisure None reported      Cognition   Overall Cognitive Status Within Functional Limits for tasks assessed      Observation/Other Assessments   Observations Patient appears in no apparent distress, she is using a motorized scooter for mobility    Focus on Therapeutic Outcomes (FOTO)  NA - incorrect set-up, patient needs "no body part"      Observation/Other Assessments-Edema    Edema --    Patient exhibit bilat foot/lower leg edema, greater on righ     Sensation   Light Touch Impaired by gross assessment    Additional Comments Patient reports impaired sensation to right lower leg below the knee, most sensation loss noted posteriorly and plantar surface of foot      Coordination   Gross Motor Movements are Fluid and Coordinated Yes      Functional Tests   Functional tests Other      Other:   Other/ Comments Standing  unsupported: 45 seconds      ROM / Strength   AROM / PROM / Strength AROM;Strength      AROM   Overall AROM Comments Right ankle AROM grossly limited in all directions due to previous ankle fusion      Strength   Overall Strength Comments Ankle not assessed due to limitation in ankle motion    Strength Assessment Site Hip;Knee    Right/Left Hip Right;Left    Right Hip Flexion 3-/5    Right Hip Extension 3-/5    Right Hip ABduction 3-/5  Left Hip Flexion 4/5    Left Hip Extension 3-/5    Left Hip ABduction 3-/5    Right/Left Knee Right;Left    Right Knee Flexion 4-/5    Right Knee Extension 3-/5    Left Knee Flexion 4+/5    Left Knee Extension 4+/5      Transfers   Transfers Stand Pivot Transfers    Stand Pivot Transfers 6: Modified independent (Device/Increase time)    Comments Rolling walker      Ambulation/Gait   Ambulation/Gait Yes    Ambulation/Gait Assistance 6: Modified independent (Device/Increase time)    Ambulation Distance (Feet) 6 Feet    Assistive device Rolling walker    Gait Comments Requires cueing to remain inside walker                         [facility] Adult PT Treatment/Exercise - 07/28/20 0001      Self-Care   Self-Care Other Self-Care Comments    Other Self-Care Comments  POC, progressing standing and walking tolerance, short bouts of activity, proper use of RW      Exercises   Exercises Knee/Hip      Knee/Hip Exercises: Aerobic   Nustep L5 x 6 min (LE only)      Knee/Hip Exercises: Seated   Long Arc Quad 2 sets;10 reps    Marching 2 sets;10 reps      Knee/Hip Exercises: Supine   Bridges 10 reps    Bridges Limitations unable to clear hips but encoruaged to engage buttock muscles    Other Supine Knee/Hip Exercises Hip abduction/adduction x10    Other Supine Knee/Hip Exercises Hooklying marching 2x10                  PT Education - 07/28/20 1050    Education Details POC, HEP, standing and walking endurance     Person(s) Educated Patient    Methods Explanation;Demonstration;Verbal cues;Handout    Comprehension Verbalized understanding;Returned demonstration;Verbal cues required;Need further instruction            PT Short Term Goals - 07/28/20 1139      PT SHORT TERM GOAL #1   Title Patient will be I with HEP to improve strength and mobility    Baseline Patient demonstrated HEP appropriately and expressed understanding    Status Achieved      PT SHORT TERM GOAL #2   Title Patient will report consistency with exercises and performance of frequent short walks at home    Time 3    Period Weeks    Status New    Target Date 08/18/20             PT Long Term Goals - 07/28/20 1054      PT LONG TERM GOAL #1   Title Patient will be I with final HEP to maintain progress made from PT    Time 6    Period Weeks    Status New    Target Date 09/08/20      PT LONG TERM GOAL #2   Title Patient will be able to ambulate >/= 40 ft with RW in order to improve household mobility    Time 6    Period Weeks    Status New    Target Date 09/08/20      PT LONG TERM GOAL #3   Title Patient will be able >/= 5 minutes without support in order to be able to get herself something  to eat or drink    Time 6    Period Weeks    Status New    Target Date 09/08/20                 Plan - 07/28/20 1051    Clinical Impression Statement Therapy limited due to patient arriving late due to road construction. Performed reassessment with standing and gait measures to submit for new POC. Patient exhibits standing and ambulation impairments limiting her household mobility and independence. Therapy focused on reviewing HEP and progressing as able, patient was encouraged to time her standing ability to she can continue to improve and to walk short distances with rolling walker during the day. She was cued for proper positioning of rolling walker with transfers and gait to ensure she is able to support herself in  the situation her right leg gives out on her. Patient would benefit from continued skilled PT in order to progress her strength and mobility in order to allow for household ambulation using a rolling walker, and improving independence with ADLs and tasks such as bathing and dressing.    Personal Factors and Comorbidities Fitness;Past/Current Experience;Time since onset of injury/illness/exacerbation;Comorbidity 3+;Transportation    Comorbidities Spina bifida of lumbosacral region with hydrocephalus, history of Chiari malformation, pevious ankle surgeries, BMI, chronic back pain    Examination-Activity Limitations Bathing;Locomotion Level;Transfers;Dressing;Hygiene/Grooming;Stand;Stairs;Squat;Sleep    Examination-Participation Restrictions Meal Prep;Cleaning;Occupation;Community Activity;Driving;Shop;Laundry;Yard Work    Rehab Potential Good    PT Frequency 2x / week    PT Duration 6 weeks    PT Treatment/Interventions ADLs/Self Care Home Management;Cryotherapy;Electrical Stimulation;Iontophoresis 4mg /ml Dexamethasone;Moist Heat;Neuromuscular re-education;Balance training;Therapeutic exercise;Therapeutic activities;Functional mobility training;Stair training;Gait training;Patient/family education;Manual techniques;Passive range of motion;Dry needling;Joint Manipulations;Taping;Vasopneumatic Device    PT Next Visit Plan Assess HEP and progress PRN, NuStep, progress seated and supine exercises, initiate standing exercises as able, work on standing for increased time, gait training with rolling walker    PT Home Exercise Plan [id number]: ankle pumps (ankle AROM all directions), supine heel slide, supine marching, supine hip abduction/adduction, bridge, LAQ, seated marching, standing lateral weight shifts    Consulted and Agree with Plan of Care Patient           Patient will benefit from skilled therapeutic intervention in order to improve the following deficits and impairments:  Abnormal gait, Difficulty  walking, Decreased activity tolerance, Pain, Decreased strength, Decreased mobility, Increased edema, Decreased endurance, Decreased balance  Visit Diagnosis: Other abnormalities of gait and mobility  Muscle weakness (generalized)  Pain in right ankle and joints of right foot  Stiffness of right ankle, not elsewhere classified  Localized edema     Problem List Patient Active Problem List   Diagnosis Date Noted  . Post-traumatic arthritis of ankle, right 12/23/2018  . Closed right ankle fracture 06/14/2018  . Hypoalbuminemia due to protein-calorie malnutrition (HCC)   . Acute blood loss anemia   . 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 History of Chiari malformation   . Trimalleolar fracture of ankle, closed, left, initial encounter 04/29/2018  . Closed displaced trimalleolar fracture of right ankle 04/23/2018    04/25/2018, PT, DPT, LAT, ATC 07/28/20  11:43 AM Phone: [phone] Fax: [phone]   [facility] Outpatient Rehabilitation [facility] [address] [place], [place], [place] Phone: [phone]   Fax:  [phone]  Name: [name] MRN: Shirline Frees Date of Birth: [birth year]

## 2020-08-01 ENCOUNTER — Ambulatory Visit: Payer: No Typology Code available for payment source | Attending: Physical Medicine & Rehabilitation

## 2020-08-01 ENCOUNTER — Other Ambulatory Visit: Payer: Self-pay

## 2020-08-01 DIAGNOSIS — M6281 Muscle weakness (generalized): Secondary | ICD-10-CM | POA: Insufficient documentation

## 2020-08-01 DIAGNOSIS — M25571 Pain in right ankle and joints of right foot: Secondary | ICD-10-CM | POA: Diagnosis present

## 2020-08-01 DIAGNOSIS — R600 Localized edema: Secondary | ICD-10-CM | POA: Insufficient documentation

## 2020-08-01 DIAGNOSIS — R2689 Other abnormalities of gait and mobility: Secondary | ICD-10-CM | POA: Diagnosis not present

## 2020-08-01 DIAGNOSIS — M25671 Stiffness of right ankle, not elsewhere classified: Secondary | ICD-10-CM

## 2020-08-02 ENCOUNTER — Ambulatory Visit: Payer: No Typology Code available for payment source

## 2020-08-02 NOTE — Therapy (Signed)
[facility] Outpatient Rehabilitation [facility] [address] [place], [place], [address] Phone: [phone]   Fax:  [phone]  Physical Therapy Treatment  Patient Details  Name: [name] MRN: [id number] Date of Birth: February 20, 1971 Referring Provider (PT): Wynn Banker Victorino Sparrow, MD   Encounter Date: 08/01/2020   PT End of Session - 08/01/20 1913    Visit Number 3    Number of Visits 13    Date for PT Re-Evaluation 09/08/20    Authorization Type Worker's Comp    Authorization Time Period 12 visits (2x/week for 6 weeks)    Authorization - Visit Number 2    Authorization - Number of Visits 12    PT Start Time 1836    PT Stop Time 1922    PT Time Calculation (min) 46 min    Activity Tolerance Patient tolerated treatment well    Behavior During Therapy [facility] for tasks assessed/performed           Past Medical History:  Diagnosis Date  . Chiari malformation type II (HCC)   . Family history of adverse reaction to anesthesia    grandfather died from anesthesia; grandfather had malignant hyperthermia  . GERD (gastroesophageal reflux disease)   . Hydrocephalus (HCC)   . Malignant hyperthermia   . Neuromuscular disorder (HCC)    Neuropathy  . Osteoarthritis   . PONV (postoperative nausea and vomiting)   . Spina bifida [facility])     Past Surgical History:  Procedure Laterality Date  . ANKLE ARTHROSCOPY Left    related to Tendon  . ANKLE FUSION Right 12/23/2018   Procedure: right tibiotalar calcaneal arthrodesis;  Surgeon: Toni Arthurs, MD;  Location: [facility] OR;  Service: Orthopedics;  Laterality: Right;   . ANKLE SURGERY Right 12/23/2018  . BACK SURGERY  1971   Spinal Bifida  . BACK SURGERY  2012   fusion  . EXTERNAL FIXATION LEG Right 05/18/2018   Procedure: EXTERNAL FIXATION LEG;  Surgeon: Yolonda Kida, MD;  Location: [facility] OR;  Service: Orthopedics;  Laterality: Right;  . EXTERNAL FIXATION REMOVAL Right 06/14/2018   Procedure: Right ankle  external fixator removal with steinman pin placement;  Surgeon: Yolonda Kida, MD;  Location: [facility] OR;  Service: Orthopedics;  Laterality: Right;  60 mins  . HARDWARE REMOVAL Right 05/18/2018   Procedure: HARDWARE REMOVAL;  Surgeon: Yolonda Kida, MD;  Location: [facility] OR;  Service: Orthopedics;  Laterality: Right;  . HARDWARE REMOVAL Right 12/23/2018   Procedure: RIGHT ANKLE REMOVAL OF DEEP IMPLANTS;  Surgeon: Toni Arthurs, MD;  Location: [facility] OR;  Service: Orthopedics;  Laterality: Right;  . KNEE ARTHROSCOPY Left 1983  . ORIF ANKLE FRACTURE Right 04/23/2018   Procedure: OPEN REDUCTION INTERNAL FIXATION (ORIF) TRIMALLEOLAR ANKLE FRACTURE;  Surgeon: Yolonda Kida, MD;  Location: [facility] ORS;  Service: Orthopedics;  Laterality: Right;  120 mins  . ORIF ANKLE FRACTURE Right 05/18/2018   Procedure: Right ankle open reduction internal fixation revision;  Surgeon: Yolonda Kida, MD;  Location: [facility] OR;  Service: Orthopedics;  Laterality: Right;  120 mins  . SHUNT EXTERNALIZATION     Head x 2  . TIBIA OSTEOTOMY Right 12/23/2018   Procedure: RIGHT MEDIAL MALLEOLUS OSTEOTOMY;  Surgeon: Toni Arthurs, MD;  Location: [facility] OR;  Service: Orthopedics;  Laterality: Right;  . WISDOM TOOTH EXTRACTION      There were no vitals filed for this visit.   Subjective Assessment - 08/01/20 1844    Subjective Pt reports a HA from the  3 day EEG. Pt reports 3/10 pain of the R LE.    Pertinent History Spina bifida of lumbosacral region with hydrocephalus, history of Chiari malformation, pevious ankle surgeries    Patient Stated Goals Improve ability to walk and stand in household    Currently in Pain? Yes    Pain Score 3     Pain Location Leg    Pain Orientation Right    Pain Descriptors / Indicators Aching;Tightness    Pain Type Chronic pain    Pain Onset More than a month ago    Pain Frequency Intermittent    Aggravating Factors  WBing    Pain Relieving Factors Rest    Effect of Pain on Daily  Activities Patient is limited with standing, walking, general mobility              [facility] PT Assessment - 08/02/20 0001      Ambulation/Gait   Ambulation/Gait Yes    Ambulation/Gait Assistance 5: Supervision    Ambulation Distance (Feet) 12 Feet   x 2 around mat table   Assistive device Rolling walker    Gait Comments Pt reports R ankle weakness at end of 2nd attempt                         [facility] Adult PT Treatment/Exercise - 08/02/20 0001      Exercises   Exercises Knee/Hip      Knee/Hip Exercises: Aerobic   Nustep L5 x 7 min (LE only)      Knee/Hip Exercises: Seated   Long Arc Quad 2 sets;10 reps    Marching 2 sets;10 reps      Knee/Hip Exercises: Supine   Bridges 10 reps;2 sets    Bridges Limitations unable to clear hips but encoruaged to engage buttock muscles    Other Supine Knee/Hip Exercises Hip abduction/adduction; 10x2    Other Supine Knee/Hip Exercises Hooklying marching 10x2                  PT Education - 08/01/20 1909    Education Details To complete STS 3-5 reps and walk 1 step each LE forward/backward repeating as tolerated  every hour    Person(s) Educated Patient    Methods Explanation;Demonstration    Comprehension Verbalized understanding            PT Short Term Goals - 07/28/20 1139      PT SHORT TERM GOAL #1   Title Patient will be I with HEP to improve strength and mobility    Baseline Patient demonstrated HEP appropriately and expressed understanding    Status Achieved      PT SHORT TERM GOAL #2   Title Patient will report consistency with exercises and performance of frequent short walks at home    Time 3    Period Weeks    Status New    Target Date 08/18/20             PT Long Term Goals - 07/28/20 1054      PT LONG TERM GOAL #1   Title Patient will be I with final HEP to maintain progress made from PT    Time 6    Period Weeks    Status New    Target Date 09/08/20      PT LONG TERM GOAL #2    Title Patient will be able to ambulate >/= 40 ft with RW in order to improve household mobility  Time 6    Period Weeks    Status New    Target Date 09/08/20      PT LONG TERM GOAL #3   Title Patient will be able >/= 5 minutes without support in order to be able to get herself something to eat or drink    Time 6    Period Weeks    Status New    Target Date 09/08/20                 Plan - 08/02/20 0752    Clinical Impression Statement PT focused on LE strengthening, ambulation, activity tolerance, and recommendations for exs/activity at home for functional gains. Today pt's ambulatory distance was increased from 6' to 12' x 2. the 2nd walk was limited with pt reporting R ankle weakness. Pt will benefit from PT for strengthening, ambulation, and endurance activities to maximize functional mobility.    Comorbidities Spina bifida of lumbosacral region with hydrocephalus, history of Chiari malformation, pevious ankle surgeries, BMI, chronic back pain    Examination-Activity Limitations Bathing;Locomotion Level;Transfers;Dressing;Hygiene/Grooming;Stand;Stairs;Squat;Sleep    Examination-Participation Restrictions Meal Prep;Cleaning;Occupation;Community Activity;Driving;Shop;Laundry;Yard Work    Stability/Clinical Decision Making Evolving/Moderate complexity    Clinical Decision Making Moderate    Rehab Potential Good    PT Frequency 2x / week    PT Treatment/Interventions ADLs/Self Care Home Management;Cryotherapy;Electrical Stimulation;Iontophoresis 4mg /ml Dexamethasone;Moist Heat;Neuromuscular re-education;Balance training;Therapeutic exercise;Therapeutic activities;Functional mobility training;Stair training;Gait training;Patient/family education;Manual techniques;Passive range of motion;Dry needling;Joint Manipulations;Taping;Vasopneumatic Device    PT Next Visit Plan Assess HEP and progress PRN, NuStep, progress seated and supine exercises, initiate standing exercises as able, work on  standing for increased time, gait training with rolling walker    PT Home Exercise Plan [id number]: ankle pumps (ankle AROM all directions), supine heel slide, supine marching, supine hip abduction/adduction, bridge, LAQ, seated marching, standing lateral weight shifts; forward/backward steps c RW, STS from reciner t/f RW.           Patient will benefit from skilled therapeutic intervention in order to improve the following deficits and impairments:  Abnormal gait, Difficulty walking, Decreased activity tolerance, Pain, Decreased strength, Decreased mobility, Increased edema, Decreased endurance, Decreased balance  Visit Diagnosis: Other abnormalities of gait and mobility  Muscle weakness (generalized)  Pain in right ankle and joints of right foot  Stiffness of right ankle, not elsewhere classified  Localized edema     Problem List Patient Active Problem List   Diagnosis Date Noted  . Post-traumatic arthritis of ankle, right 12/23/2018  . Closed right ankle fracture 06/14/2018  . Hypoalbuminemia due to protein-calorie malnutrition (HCC)   . Acute blood loss anemia   . 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 History of Chiari malformation   . Trimalleolar fracture of ankle, closed, left, initial encounter 04/29/2018  . Closed displaced trimalleolar fracture of right ankle 04/23/2018   04/25/2018 MS, PT 08/02/20 8:01 AM  [facility] [address] [place], [place], [place] Phone: [phone]   Fax:  [phone]  Name: [name] MRN: Shirline Frees Date of Birth: [birth year]

## 2020-08-07 ENCOUNTER — Telehealth: Payer: Self-pay

## 2020-08-07 NOTE — Telephone Encounter (Signed)
Patient is out of Lyrica. Is it possible to send a refill? Her next appointment is on 09/07/2020.

## 2020-08-08 ENCOUNTER — Other Ambulatory Visit: Payer: Self-pay

## 2020-08-08 ENCOUNTER — Ambulatory Visit: Payer: No Typology Code available for payment source

## 2020-08-08 DIAGNOSIS — M25671 Stiffness of right ankle, not elsewhere classified: Secondary | ICD-10-CM

## 2020-08-08 DIAGNOSIS — M6281 Muscle weakness (generalized): Secondary | ICD-10-CM

## 2020-08-08 DIAGNOSIS — M25571 Pain in right ankle and joints of right foot: Secondary | ICD-10-CM

## 2020-08-08 DIAGNOSIS — R2689 Other abnormalities of gait and mobility: Secondary | ICD-10-CM

## 2020-08-08 DIAGNOSIS — R600 Localized edema: Secondary | ICD-10-CM

## 2020-08-08 MED ORDER — PREGABALIN 100 MG PO CAPS: 100.0000 mg | ORAL_CAPSULE | Freq: Three times a day (TID) | ORAL | 5 refills | Status: DC

## 2020-08-08 NOTE — Addendum Note (Signed)
Addended by: Erick Colace on: 08/08/2020 11:11 AM   Modules accepted: Orders

## 2020-08-09 ENCOUNTER — Ambulatory Visit: Payer: No Typology Code available for payment source

## 2020-08-09 DIAGNOSIS — M6281 Muscle weakness (generalized): Secondary | ICD-10-CM

## 2020-08-09 DIAGNOSIS — M25571 Pain in right ankle and joints of right foot: Secondary | ICD-10-CM

## 2020-08-09 DIAGNOSIS — R2689 Other abnormalities of gait and mobility: Secondary | ICD-10-CM | POA: Diagnosis not present

## 2020-08-09 DIAGNOSIS — M25671 Stiffness of right ankle, not elsewhere classified: Secondary | ICD-10-CM

## 2020-08-09 DIAGNOSIS — R600 Localized edema: Secondary | ICD-10-CM

## 2020-08-09 NOTE — Therapy (Signed)
[facility] Outpatient Rehabilitation [facility] [address] [place], [place], [address] Phone: [phone]   Fax:  [phone]  Physical Therapy Treatment  Patient Details  Name: [name] MRN: [id number] Date of Birth: [birth year] Referring Provider (PT): Erick Colace, MD   Encounter Date: 08/08/2020   PT End of Session - 08/09/20 0828    Visit Number 4    Number of Visits 13    Date for PT Re-Evaluation 09/08/20    Authorization Type Worker's Comp    Authorization Time Period 12 visits (2x/week for 6 weeks)    Authorization - Visit Number 3    Authorization - Number of Visits 12    PT Start Time 1836    PT Stop Time 1920    PT Time Calculation (min) 44 min    Activity Tolerance Patient tolerated treatment well    Behavior During Therapy [facility] for tasks assessed/performed           Past Medical History:  Diagnosis Date  . Chiari malformation type II (HCC)   . Family history of adverse reaction to anesthesia    grandfather died from anesthesia; grandfather had malignant hyperthermia  . GERD (gastroesophageal reflux disease)   . Hydrocephalus (HCC)   . Malignant hyperthermia   . Neuromuscular disorder (HCC)    Neuropathy  . Osteoarthritis   . PONV (postoperative nausea and vomiting)   . Spina bifida [facility])     Past Surgical History:  Procedure Laterality Date  . ANKLE ARTHROSCOPY Left    related to Tendon  . ANKLE FUSION Right 12/23/2018   Procedure: right tibiotalar calcaneal arthrodesis;  Surgeon: Toni Arthurs, MD;  Location: [facility] OR;  Service: Orthopedics;  Laterality: Right;   . ANKLE SURGERY Right 12/23/2018  . BACK SURGERY  1971   Spinal Bifida  . BACK SURGERY  2012   fusion  . EXTERNAL FIXATION LEG Right 05/18/2018   Procedure: EXTERNAL FIXATION LEG;  Surgeon: Yolonda Kida, MD;  Location: [facility] OR;  Service: Orthopedics;  Laterality: Right;  . EXTERNAL FIXATION REMOVAL Right 06/14/2018   Procedure: Right ankle  external fixator removal with steinman pin placement;  Surgeon: Yolonda Kida, MD;  Location: [facility] OR;  Service: Orthopedics;  Laterality: Right;  60 mins  . HARDWARE REMOVAL Right 05/18/2018   Procedure: HARDWARE REMOVAL;  Surgeon: Yolonda Kida, MD;  Location: [facility] OR;  Service: Orthopedics;  Laterality: Right;  . HARDWARE REMOVAL Right 12/23/2018   Procedure: RIGHT ANKLE REMOVAL OF DEEP IMPLANTS;  Surgeon: Toni Arthurs, MD;  Location: [facility] OR;  Service: Orthopedics;  Laterality: Right;  . KNEE ARTHROSCOPY Left 1983  . ORIF ANKLE FRACTURE Right 04/23/2018   Procedure: OPEN REDUCTION INTERNAL FIXATION (ORIF) TRIMALLEOLAR ANKLE FRACTURE;  Surgeon: Yolonda Kida, MD;  Location: [facility] ORS;  Service: Orthopedics;  Laterality: Right;  120 mins  . ORIF ANKLE FRACTURE Right 05/18/2018   Procedure: Right ankle open reduction internal fixation revision;  Surgeon: Yolonda Kida, MD;  Location: [facility] OR;  Service: Orthopedics;  Laterality: Right;  120 mins  . SHUNT EXTERNALIZATION     Head x 2  . TIBIA OSTEOTOMY Right 12/23/2018   Procedure: RIGHT MEDIAL MALLEOLUS OSTEOTOMY;  Surgeon: Toni Arthurs, MD;  Location: [facility] OR;  Service: Orthopedics;  Laterality: Right;  . WISDOM TOOTH EXTRACTION      There were no vitals filed for this visit.   Subjective Assessment - 08/08/20 1839    Subjective Pt reports she is experiencing her  R ankle is rolling out at times with walking/transfers and she is not tolerating exs in supine because of HAs which she thinks is related to her shunt.    Patient Stated Goals Improve ability to walk and stand in household    Currently in Pain? Yes    Pain Score 2     Pain Orientation Right    Pain Descriptors / Indicators Aching;Tightness    Pain Onset More than a month ago    Pain Frequency Intermittent    Aggravating Factors  WBing    Pain Relieving Factors Rest    Effect of Pain on Daily Activities Limited general mobility                              [facility] Adult PT Treatment/Exercise - 08/09/20 0001      Ambulation/Gait   Ambulation/Gait Yes    Ambulation/Gait Assistance 5: Supervision    Ambulation Distance (Feet) 30 Feet    Assistive device Rolling walker    Gait Comments Pt reported LE weakness near end of ambulation      Exercises   Exercises Knee/Hip;Ankle      Knee/Hip Exercises: Seated   Long Arc Quad 2 sets;10 reps    Marching 2 sets;10 reps    Abduction/Adduction  2 sets;10 reps    Sit to [employer] 10 reps;with UE support   t/f RW     Ankle Exercises: Seated   Heel Raises 15 reps;Left    Toe Raise 15 reps    Toe Raise Limitations left                  PT Education - 08/09/20 0824    Education Details Education re: loss of proprioception of her R foot/ankle as reason for positioning issues c the R foot and also that her less stable Croc shoes may also be contributing to this issue. Advised pt she would need to be careful with the placement of her r foot with visual feedback.    Person(s) Educated Patient    Methods Explanation;Demonstration    Comprehension Verbalized understanding;Returned demonstration;Verbal cues required            PT Short Term Goals - 07/28/20 1139      PT SHORT TERM GOAL #1   Title Patient will be I with HEP to improve strength and mobility    Baseline Patient demonstrated HEP appropriately and expressed understanding    Status Achieved      PT SHORT TERM GOAL #2   Title Patient will report consistency with exercises and performance of frequent short walks at home    Time 3    Period Weeks    Status New    Target Date 08/18/20             PT Long Term Goals - 07/28/20 1054      PT LONG TERM GOAL #1   Title Patient will be I with final HEP to maintain progress made from PT    Time 6    Period Weeks    Status New    Target Date 09/08/20      PT LONG TERM GOAL #2   Title Patient will be able to ambulate >/= 40 ft with RW in  order to improve household mobility    Time 6    Period Weeks    Status New    Target Date 09/08/20  PT LONG TERM GOAL #3   Title Patient will be able >/= 5 minutes without support in order to be able to get herself something to eat or drink    Time 6    Period Weeks    Status New    Target Date 09/08/20                 Plan - 08/09/20 0829    Clinical Impression Statement Pt's R ankle fusion, which the pt reports was incomplete, is stable with min. medial/lateral movement. Following education re: safe palcement of the R foot, pt pt did not experience any issues with the R foot/ankle rolling out. Pt's gait tolerance was improved today. Ther ex was completed in sitting to avoid aggrevating her HA.    Personal Factors and Comorbidities Fitness;Past/Current Experience;Time since onset of injury/illness/exacerbation;Comorbidity 3+;Transportation    Comorbidities Spina bifida of lumbosacral region with hydrocephalus, history of Chiari malformation, pevious ankle surgeries, BMI, chronic back pain    Examination-Activity Limitations Bathing;Locomotion Level;Transfers;Dressing;Hygiene/Grooming;Stand;Stairs;Squat;Sleep    Examination-Participation Restrictions Meal Prep;Cleaning;Occupation;Community Activity;Driving;Shop;Laundry;Yard Work    Conservation officer, historic buildings Evolving/Moderate complexity    Clinical Decision Making Moderate    Rehab Potential Good    PT Frequency 2x / week    PT Duration 6 weeks    PT Treatment/Interventions ADLs/Self Care Home Management;Cryotherapy;Electrical Stimulation;Iontophoresis 4mg /ml Dexamethasone;Moist Heat;Neuromuscular re-education;Balance training;Therapeutic exercise;Therapeutic activities;Functional mobility training;Stair training;Gait training;Patient/family education;Manual techniques;Passive range of motion;Dry needling;Joint Manipulations;Taping;Vasopneumatic Device    PT Next Visit Plan Progress ther ex/HEP and functional mobility  as tlerated    PT Home Exercise Plan [id number]: ankle pumps (ankle AROM all directions), supine heel slide, supine marching, supine hip abduction/adduction, bridge, LAQ, seated marching, standing lateral weight shifts; forward/backward steps c RW, STS from reciner t/f RW.    Consulted and Agree with Plan of Care Patient           Patient will benefit from skilled therapeutic intervention in order to improve the following deficits and impairments:  Abnormal gait, Difficulty walking, Decreased activity tolerance, Pain, Decreased strength, Decreased mobility, Increased edema, Decreased endurance, Decreased balance  Visit Diagnosis: Other abnormalities of gait and mobility  Muscle weakness (generalized)  Pain in right ankle and joints of right foot  Stiffness of right ankle, not elsewhere classified  Localized edema     Problem List Patient Active Problem List   Diagnosis Date Noted  . Post-traumatic arthritis of ankle, right 12/23/2018  . Closed right ankle fracture 06/14/2018  . Hypoalbuminemia due to protein-calorie malnutrition (HCC)   . Acute blood loss anemia   . 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 History of Chiari malformation   . Trimalleolar fracture of ankle, closed, left, initial encounter 04/29/2018  . Closed displaced trimalleolar fracture of right ankle 04/23/2018    04/25/2018 MS, PT 08/09/20 8:40 AM  [facility] Outpatient Rehabilitation [facility] [address] [place], [place], [place] Phone: [phone]   Fax:  [phone]  Name: CHRISTYANA CORWIN MRN: Shirline Frees Date of Birth: [birth year]

## 2020-08-09 NOTE — Therapy (Signed)
[facility] Outpatient Rehabilitation [facility] [address] [place], [place], [address] Phone: [phone]   Fax:  [phone]  Physical Therapy Treatment  Patient Details  Name: [name] MRN: [id number] Date of Birth: 01-17-1971 Referring Provider (PT): Wynn Banker Victorino Sparrow, MD   Encounter Date: 08/09/2020   PT End of Session - 08/09/20 1837    Visit Number 5    Number of Visits 13    Date for PT Re-Evaluation 09/08/20    Authorization Type Worker's Comp    Authorization Time Period 12 visits (2x/week for 6 weeks)    Authorization - Visit Number 4    Authorization - Number of Visits 12    PT Start Time 1817    PT Stop Time 1906    PT Time Calculation (min) 49 min    Activity Tolerance Patient tolerated treatment well    Behavior During Therapy [facility] for tasks assessed/performed           Past Medical History:  Diagnosis Date  . Chiari malformation type II (HCC)   . Family history of adverse reaction to anesthesia    grandfather died from anesthesia; grandfather had malignant hyperthermia  . GERD (gastroesophageal reflux disease)   . Hydrocephalus (HCC)   . Malignant hyperthermia   . Neuromuscular disorder (HCC)    Neuropathy  . Osteoarthritis   . PONV (postoperative nausea and vomiting)   . Spina bifida [facility])     Past Surgical History:  Procedure Laterality Date  . ANKLE ARTHROSCOPY Left    related to Tendon  . ANKLE FUSION Right 12/23/2018   Procedure: right tibiotalar calcaneal arthrodesis;  Surgeon: Toni Arthurs, MD;  Location: [facility] OR;  Service: Orthopedics;  Laterality: Right;   . ANKLE SURGERY Right 12/23/2018  . BACK SURGERY  1971   Spinal Bifida  . BACK SURGERY  2012   fusion  . EXTERNAL FIXATION LEG Right 05/18/2018   Procedure: EXTERNAL FIXATION LEG;  Surgeon: Yolonda Kida, MD;  Location: [facility] OR;  Service: Orthopedics;  Laterality: Right;  . EXTERNAL FIXATION REMOVAL Right 06/14/2018   Procedure: Right ankle  external fixator removal with steinman pin placement;  Surgeon: Yolonda Kida, MD;  Location: [facility] OR;  Service: Orthopedics;  Laterality: Right;  60 mins  . HARDWARE REMOVAL Right 05/18/2018   Procedure: HARDWARE REMOVAL;  Surgeon: Yolonda Kida, MD;  [facility] OR;  Service: Orthopedics;  Laterality: Right;  . HARDWARE REMOVAL Right 12/23/2018   Procedure: RIGHT ANKLE REMOVAL OF DEEP IMPLANTS;  Surgeon: Toni Arthurs, MD;  Location: [facility] OR;  Service: Orthopedics;  Laterality: Right;  . KNEE ARTHROSCOPY Left 1983  . ORIF ANKLE FRACTURE Right 04/23/2018   Procedure: OPEN REDUCTION INTERNAL FIXATION (ORIF) TRIMALLEOLAR ANKLE FRACTURE;  Surgeon: Yolonda Kida, MD;  Location: [facility] ORS;  Service: Orthopedics;  Laterality: Right;  120 mins  . ORIF ANKLE FRACTURE Right 05/18/2018   Procedure: Right ankle open reduction internal fixation revision;  Surgeon: Yolonda Kida, MD;  Location: [facility] OR;  Service: Orthopedics;  Laterality: Right;  120 mins  . SHUNT EXTERNALIZATION     Head x 2  . TIBIA OSTEOTOMY Right 12/23/2018   Procedure: RIGHT MEDIAL MALLEOLUS OSTEOTOMY;  Surgeon: Toni Arthurs, MD;  Location: [facility] OR;  Service: Orthopedics;  Laterality: Right;  . WISDOM TOOTH EXTRACTION      There were no vitals filed for this visit.   Subjective Assessment - 08/09/20 1825    Subjective Pt reports she is experiencing some  LE soreness from yesteday's appt, but is overall during well. Pt states she is trying pay more attention to the position of her R foot with STS and walking.    Patient Stated Goals Improve ability to walk and stand in household    Currently in Pain? Yes    Pain Score 3     Pain Location Ankle   heel   Pain Orientation Right;Lateral;Medial    Pain Descriptors / Indicators Aching;Tingling    Pain Type Chronic pain    Pain Onset More than a month ago    Pain Frequency Intermittent                             [facility] Adult PT Treatment/Exercise -  08/09/20 1931      Ambulation/Gait   Ambulation/Gait Yes    Ambulation/Gait Assistance 5: Supervision    Ambulation Distance (Feet) 30 Feet   10 ft 2nd attempt   Assistive device Rolling walker    Gait Comments Pt reported LE weakness near end of ambulation      Exercises   Exercises Knee/Hip;Ankle      Knee/Hip Exercises: Aerobic   Nustep L5 x 5 min (LE only)      Knee/Hip Exercises: Seated   Long Arc Quad 2 sets;15 reps    Marching 2 sets;15 reps    Abduction/Adduction  2 sets;15 reps    Sit to [employer] 10 reps;with UE support   t/f RW     Ankle Exercises: Seated   Heel Raises 15 reps;Left   2 sets   Toe Raise 15 reps    Toe Raise Limitations left   2 sets                 PT Education - 08/09/20 1923    Education Details Pt is to add extended standing c RW as tolerated as one of the hourly activities (sit t/f standing; forward/backward steps) at home to improve her strength and function.    Person(s) Educated Patient    Methods Explanation    Comprehension Verbalized understanding            PT Short Term Goals - 07/28/20 1139      PT SHORT TERM GOAL #1   Title Patient will be I with HEP to improve strength and mobility    Baseline Patient demonstrated HEP appropriately and expressed understanding    Status Achieved      PT SHORT TERM GOAL #2   Title Patient will report consistency with exercises and performance of frequent short walks at home    Time 3    Period Weeks    Status New    Target Date 08/18/20             PT Long Term Goals - 07/28/20 1054      PT LONG TERM GOAL #1   Title Patient will be I with final HEP to maintain progress made from PT    Time 6    Period Weeks    Status New    Target Date 09/08/20      PT LONG TERM GOAL #2   Title Patient will be able to ambulate >/= 40 ft with RW in order to improve household mobility    Time 6    Period Weeks    Status New    Target Date 09/08/20      PT LONG TERM GOAL #3  Title  Patient will be able >/= 5 minutes without support in order to be able to get herself something to eat or drink    Time 6    Period Weeks    Status New    Target Date 09/08/20                 Plan - 08/09/20 1853    Clinical Impression Statement PT focused on LE strengthening, sit to stand, and ambulation. A second attempt for ambualtion was limited by LE weakness and fatigue. Overall pt is making appropriate progress. Pt will continue to benefit from PT to improve her functional mobility and safety.    Personal Factors and Comorbidities Fitness;Past/Current Experience;Time since onset of injury/illness/exacerbation;Comorbidity 3+;Transportation    Comorbidities Spina bifida of lumbosacral region with hydrocephalus, history of Chiari malformation, pevious ankle surgeries, BMI, chronic back pain    Examination-Activity Limitations Bathing;Locomotion Level;Transfers;Dressing;Hygiene/Grooming;Stand;Stairs;Squat;Sleep    Examination-Participation Restrictions Meal Prep;Cleaning;Occupation;Community Activity;Driving;Shop;Laundry;Yard Work    Conservation officer, historic buildings Evolving/Moderate complexity    Clinical Decision Making Moderate    Rehab Potential Good    PT Frequency 2x / week    PT Duration 6 weeks    PT Treatment/Interventions ADLs/Self Care Home Management;Cryotherapy;Electrical Stimulation;Iontophoresis 4mg /ml Dexamethasone;Moist Heat;Neuromuscular re-education;Balance training;Therapeutic exercise;Therapeutic activities;Functional mobility training;Stair training;Gait training;Patient/family education;Manual techniques;Passive range of motion;Dry needling;Joint Manipulations;Taping;Vasopneumatic Device    PT Next Visit Plan Progress ther ex/HEP and functional mobility as tolerated. Initiate asc/dsc steps starting with a 2 inch step to help pt work towards getting into an elevated bed.    PT Home Exercise Plan [id number]: ankle pumps (ankle AROM all directions), supine heel  slide, supine marching, supine hip abduction/adduction, bridge, LAQ, seated marching, standing lateral weight shifts; forward/backward steps c RW, STS from reciner t/f RW.    Consulted and Agree with Plan of Care Patient           Patient will benefit from skilled therapeutic intervention in order to improve the following deficits and impairments:  Abnormal gait, Difficulty walking, Decreased activity tolerance, Pain, Decreased strength, Decreased mobility, Increased edema, Decreased endurance, Decreased balance  Visit Diagnosis: Other abnormalities of gait and mobility  Muscle weakness (generalized)  Pain in right ankle and joints of right foot  Stiffness of right ankle, not elsewhere classified  Localized edema     Problem List Patient Active Problem List   Diagnosis Date Noted  . Post-traumatic arthritis of ankle, right 12/23/2018  . Closed right ankle fracture 06/14/2018  . Hypoalbuminemia due to protein-calorie malnutrition (HCC)   . Acute blood loss anemia   . 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 History of Chiari malformation   . Trimalleolar fracture of ankle, closed, left, initial encounter 04/29/2018  . Closed displaced trimalleolar fracture of right ankle 04/23/2018    04/25/2018 MS, PT 08/09/20 7:38 PM  [facility] Outpatient Rehabilitation [facility] [address] [place], [place], [place] Phone: [phone]   Fax:  [phone]  Name: KLOHE LOVERING MRN: Shirline Frees Date of Birth: Apr 02, 1971

## 2020-08-14 ENCOUNTER — Other Ambulatory Visit: Payer: Self-pay

## 2020-08-14 ENCOUNTER — Ambulatory Visit: Payer: No Typology Code available for payment source

## 2020-08-14 DIAGNOSIS — M6281 Muscle weakness (generalized): Secondary | ICD-10-CM

## 2020-08-14 DIAGNOSIS — M25671 Stiffness of right ankle, not elsewhere classified: Secondary | ICD-10-CM

## 2020-08-14 DIAGNOSIS — R2689 Other abnormalities of gait and mobility: Secondary | ICD-10-CM | POA: Diagnosis not present

## 2020-08-14 DIAGNOSIS — R600 Localized edema: Secondary | ICD-10-CM

## 2020-08-14 DIAGNOSIS — M25571 Pain in right ankle and joints of right foot: Secondary | ICD-10-CM

## 2020-08-14 NOTE — Therapy (Signed)
[facility] Outpatient Rehabilitation [facility] [address] [place], [place], [address] Phone: [phone]   Fax:  [phone]  Physical Therapy Treatment  Patient Details  Name: [name] MRN: [id number] Date of Birth: 06/10/1971 Referring Provider (PT): Wynn Banker Victorino Sparrow, MD   Encounter Date: 08/14/2020   PT End of Session - 08/14/20 1929    Visit Number 6    Number of Visits 13    Date for PT Re-Evaluation 09/08/20    Authorization Type Worker's Comp    Authorization Time Period 12 visits (2x/week for 6 weeks)    Authorization - Visit Number 5    Authorization - Number of Visits 12    PT Start Time 1840    PT Stop Time 1921    PT Time Calculation (min) 41 min    Activity Tolerance Patient tolerated treatment well    Behavior During Therapy [facility] for tasks assessed/performed           Past Medical History:  Diagnosis Date  . Chiari malformation type II (HCC)   . Family history of adverse reaction to anesthesia    grandfather died from anesthesia; grandfather had malignant hyperthermia  . GERD (gastroesophageal reflux disease)   . Hydrocephalus (HCC)   . Malignant hyperthermia   . Neuromuscular disorder (HCC)    Neuropathy  . Osteoarthritis   . PONV (postoperative nausea and vomiting)   . Spina bifida [facility])     Past Surgical History:  Procedure Laterality Date  . ANKLE ARTHROSCOPY Left    related to Tendon  . ANKLE FUSION Right 12/23/2018   Procedure: right tibiotalar calcaneal arthrodesis;  Surgeon: Toni Arthurs, MD;  Location: [facility] OR;  Service: Orthopedics;  Laterality: Right;   . ANKLE SURGERY Right 12/23/2018  . BACK SURGERY  1971   Spinal Bifida  . BACK SURGERY  2012   fusion  . EXTERNAL FIXATION LEG Right 05/18/2018   Procedure: EXTERNAL FIXATION LEG;  Surgeon: Yolonda Kida, MD;  Location: [facility] OR;  Service: Orthopedics;  Laterality: Right;  . EXTERNAL FIXATION REMOVAL Right 06/14/2018   Procedure: Right ankle  external fixator removal with steinman pin placement;  Surgeon: Yolonda Kida, MD;  Location: [facility] OR;  Service: Orthopedics;  Laterality: Right;  60 mins  . HARDWARE REMOVAL Right 05/18/2018   Procedure: HARDWARE REMOVAL;  Surgeon: Yolonda Kida, MD;  Location: [facility] OR;  Service: Orthopedics;  Laterality: Right;  . HARDWARE REMOVAL Right 12/23/2018   Procedure: RIGHT ANKLE REMOVAL OF DEEP IMPLANTS;  Surgeon: Toni Arthurs, MD;  Location: [facility] OR;  Service: Orthopedics;  Laterality: Right;  . KNEE ARTHROSCOPY Left 1983  . ORIF ANKLE FRACTURE Right 04/23/2018   Procedure: OPEN REDUCTION INTERNAL FIXATION (ORIF) TRIMALLEOLAR ANKLE FRACTURE;  Surgeon: Yolonda Kida, MD;  Location: [facility] ORS;  Service: Orthopedics;  Laterality: Right;  120 mins  . ORIF ANKLE FRACTURE Right 05/18/2018   Procedure: Right ankle open reduction internal fixation revision;  Surgeon: Yolonda Kida, MD;  Location: [facility] OR;  Service: Orthopedics;  Laterality: Right;  120 mins  . SHUNT EXTERNALIZATION     Head x 2  . TIBIA OSTEOTOMY Right 12/23/2018   Procedure: RIGHT MEDIAL MALLEOLUS OSTEOTOMY;  Surgeon: Toni Arthurs, MD;  Location: [facility] OR;  Service: Orthopedics;  Laterality: Right;  . WISDOM TOOTH EXTRACTION      There were no vitals filed for this visit.   Subjective Assessment - 08/14/20 1848    Subjective Pt reports still having issues c  her R foot turning on the side. Paying attention to her R foot pacement has helped to decrease the incidences.    Currently in Pain? Yes    Pain Score 3     Pain Location Ankle    Pain Orientation Right;Lateral;Medial    Pain Descriptors / Indicators Aching;Tingling    Pain Type Chronic pain    Pain Onset More than a month ago    Pain Frequency Intermittent    Aggravating Factors  WBing    Pain Relieving Factors Rest    Effect of Pain on Daily Activities Limited general mobility                             [facility] Adult PT Treatment/Exercise -  08/14/20 0001      Ambulation/Gait   Ambulation/Gait Yes    Ambulation/Gait Assistance 5: Supervision    Ambulation Distance (Feet) 30 Feet   2ft 2nd and 3rd attempt   Assistive device Rolling walker    Gait Comments Pt reports LE weakness/fatigue near end of ambulation      Knee/Hip Exercises: Standing   Lateral Step Up Right;3 sets;10 reps    Other Standing Knee Exercises 3 step sIde steps L and R; 6 reps; 2 sets    Other Standing Knee Exercises SIt t/f standing 10               Balance Exercises - 08/14/20 0001      Balance Exercises: Standing   Standing Eyes Opened Wide (BOA);Solid surface;3 reps   Extended standing at counter s use of hands 30, 60, 25 sec              PT Short Term Goals - 07/28/20 1139      PT SHORT TERM GOAL #1   Title Patient will be I with HEP to improve strength and mobility    Baseline Patient demonstrated HEP appropriately and expressed understanding    Status Achieved      PT SHORT TERM GOAL #2   Title Patient will report consistency with exercises and performance of frequent short walks at home    Time 3    Period Weeks    Status New    Target Date 08/18/20             PT Long Term Goals - 07/28/20 1054      PT LONG TERM GOAL #1   Title Patient will be I with final HEP to maintain progress made from PT    Time 6    Period Weeks    Status New    Target Date 09/08/20      PT LONG TERM GOAL #2   Title Patient will be able to ambulate >/= 40 ft with RW in order to improve household mobility    Time 6    Period Weeks    Status New    Target Date 09/08/20      PT LONG TERM GOAL #3   Title Patient will be able >/= 5 minutes without support in order to be able to get herself something to eat or drink    Time 6    Period Weeks    Status New    Target Date 09/08/20                 Plan - 08/14/20 1930    Clinical Impression Statement PT continues to work on ambulation and strengthening  with balance and CKC exs  added to this session. Today's session was more strenuous with the pt tolerating the increase in intensity. With standing unassisted, weakness and decreased sensation of the R LE was noted by adjustments of the hips to maintain balance. Walking distance and standing tolerance remains similar to last week.    Personal Factors and Comorbidities Fitness;Past/Current Experience;Time since onset of injury/illness/exacerbation;Comorbidity 3+;Transportation    Comorbidities Spina bifida of lumbosacral region with hydrocephalus, history of Chiari malformation, pevious ankle surgeries, BMI, chronic back pain    Examination-Activity Limitations Bathing;Locomotion Level;Transfers;Dressing;Hygiene/Grooming;Stand;Stairs;Squat;Sleep    Examination-Participation Restrictions Meal Prep;Cleaning;Occupation;Community Activity;Driving;Shop;Laundry;Yard Work    Conservation officer, historic buildings Evolving/Moderate complexity    Clinical Decision Making Moderate    Rehab Potential Good    PT Frequency 2x / week    PT Duration 6 weeks    PT Treatment/Interventions ADLs/Self Care Home Management;Cryotherapy;Electrical Stimulation;Iontophoresis 4mg /ml Dexamethasone;Moist Heat;Neuromuscular re-education;Balance training;Therapeutic exercise;Therapeutic activities;Functional mobility training;Stair training;Gait training;Patient/family education;Manual techniques;Passive range of motion;Dry needling;Joint Manipulations;Taping;Vasopneumatic Device    PT Next Visit Plan Progress ther ex/HEP and functional mobility as tolerated. Initiate asc/dsc steps starting with a 2 inch step to help pt work towards getting into an elevated bed.    PT Home Exercise Plan [id number]: ankle pumps (ankle AROM all directions), supine heel slide, supine marching, supine hip abduction/adduction, bridge, LAQ, seated marching, standing lateral weight shifts; forward/backward steps c RW, STS from reciner t/f RW.    Consulted and Agree with Plan of Care  Patient           Patient will benefit from skilled therapeutic intervention in order to improve the following deficits and impairments:  Abnormal gait, Difficulty walking, Decreased activity tolerance, Pain, Decreased strength, Decreased mobility, Increased edema, Decreased endurance, Decreased balance  Visit Diagnosis: Other abnormalities of gait and mobility  Muscle weakness (generalized)  Pain in right ankle and joints of right foot  Stiffness of right ankle, not elsewhere classified  Localized edema     Problem List Patient Active Problem List   Diagnosis Date Noted  . Post-traumatic arthritis of ankle, right 12/23/2018  . Closed right ankle fracture 06/14/2018  . Hypoalbuminemia due to protein-calorie malnutrition (HCC)   . Acute blood loss anemia   . 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 History of Chiari malformation   . Trimalleolar fracture of ankle, closed, left, initial encounter 04/29/2018  . Closed displaced trimalleolar fracture of right ankle 04/23/2018    04/25/2018 MS, PT 08/14/20 11:06 PM  [facility] Outpatient Rehabilitation [facility] [address] [place], [place], [place] Phone: [phone]   Fax:  [phone]  Name: LORYN HAACKE MRN: Shirline Frees Date of Birth: 1971/03/18

## 2020-08-16 ENCOUNTER — Other Ambulatory Visit: Payer: Self-pay

## 2020-08-16 ENCOUNTER — Ambulatory Visit: Payer: No Typology Code available for payment source

## 2020-08-16 DIAGNOSIS — M25571 Pain in right ankle and joints of right foot: Secondary | ICD-10-CM

## 2020-08-16 DIAGNOSIS — R2689 Other abnormalities of gait and mobility: Secondary | ICD-10-CM | POA: Diagnosis not present

## 2020-08-16 DIAGNOSIS — R600 Localized edema: Secondary | ICD-10-CM

## 2020-08-16 DIAGNOSIS — M6281 Muscle weakness (generalized): Secondary | ICD-10-CM

## 2020-08-16 DIAGNOSIS — M25671 Stiffness of right ankle, not elsewhere classified: Secondary | ICD-10-CM

## 2020-08-16 NOTE — Therapy (Signed)
[facility] Outpatient Rehabilitation [facility] [address] [place], [place], [address] Phone: [phone]   Fax:  [phone]  Physical Therapy Treatment  Patient Details  Name: [name] MRN: [id number] Date of Birth: 1971/05/22 Referring Provider (PT): Wynn Banker Victorino Sparrow, MD   Encounter Date: 08/16/2020   PT End of Session - 08/16/20 1904    Visit Number 7    Number of Visits 13    Date for PT Re-Evaluation 09/08/20    Authorization Type Worker's Comp    Authorization Time Period 12 visits (2x/week for 6 weeks)    Authorization - Visit Number 6    Authorization - Number of Visits 12    PT Start Time 1830    PT Stop Time 1920    PT Time Calculation (min) 50 min    Activity Tolerance Patient tolerated treatment well    Behavior During Therapy [facility] for tasks assessed/performed           Past Medical History:  Diagnosis Date  . Chiari malformation type II (HCC)   . Family history of adverse reaction to anesthesia    grandfather died from anesthesia; grandfather had malignant hyperthermia  . GERD (gastroesophageal reflux disease)   . Hydrocephalus (HCC)   . Malignant hyperthermia   . Neuromuscular disorder (HCC)    Neuropathy  . Osteoarthritis   . PONV (postoperative nausea and vomiting)   . Spina bifida [facility])     Past Surgical History:  Procedure Laterality Date  . ANKLE ARTHROSCOPY Left    related to Tendon  . ANKLE FUSION Right 12/23/2018   Procedure: right tibiotalar calcaneal arthrodesis;  Surgeon: Toni Arthurs, MD;  Location: [facility] OR;  Service: Orthopedics;  Laterality: Right;   . ANKLE SURGERY Right 12/23/2018  . BACK SURGERY  1971   Spinal Bifida  . BACK SURGERY  2012   fusion  . EXTERNAL FIXATION LEG Right 05/18/2018   Procedure: EXTERNAL FIXATION LEG;  Surgeon: Yolonda Kida, MD;  Location: [facility] OR;  Service: Orthopedics;  Laterality: Right;  . EXTERNAL FIXATION REMOVAL Right 06/14/2018   Procedure: Right ankle  external fixator removal with steinman pin placement;  Surgeon: Yolonda Kida, MD;  Location: [facility] OR;  Service: Orthopedics;  Laterality: Right;  60 mins  . HARDWARE REMOVAL Right 05/18/2018   Procedure: HARDWARE REMOVAL;  Surgeon: Yolonda Kida, MD;  Location: [facility] OR;  Service: Orthopedics;  Laterality: Right;  . HARDWARE REMOVAL Right 12/23/2018   Procedure: RIGHT ANKLE REMOVAL OF DEEP IMPLANTS;  Surgeon: Toni Arthurs, MD;  Location: [facility] OR;  Service: Orthopedics;  Laterality: Right;  . KNEE ARTHROSCOPY Left 1983  . ORIF ANKLE FRACTURE Right 04/23/2018   Procedure: OPEN REDUCTION INTERNAL FIXATION (ORIF) TRIMALLEOLAR ANKLE FRACTURE;  Surgeon: Yolonda Kida, MD;  Location: [facility] ORS;  Service: Orthopedics;  Laterality: Right;  120 mins  . ORIF ANKLE FRACTURE Right 05/18/2018   Procedure: Right ankle open reduction internal fixation revision;  Surgeon: Yolonda Kida, MD;  Location: [facility] OR;  Service: Orthopedics;  Laterality: Right;  120 mins  . SHUNT EXTERNALIZATION     Head x 2  . TIBIA OSTEOTOMY Right 12/23/2018   Procedure: RIGHT MEDIAL MALLEOLUS OSTEOTOMY;  Surgeon: Toni Arthurs, MD;  Location: [facility] OR;  Service: Orthopedics;  Laterality: Right;  . WISDOM TOOTH EXTRACTION      There were no vitals filed for this visit.   Subjective Assessment - 08/16/20 1836    Subjective Pt reports she [facility] this AM  [facility] Pt states her R leg gave out on her. Pt reports her R ankle is hurting her more today.    Patient Stated Goals Improve ability to walk and stand in household    Currently in Pain? Yes    Pain Score 4     Pain Location Ankle    Pain Orientation Right;Medial;Lateral    Pain Descriptors / Indicators Aching;Tingling    Pain Type Chronic pain    Pain Onset More than a month ago    Pain Frequency Intermittent              [facility] PT Assessment - 08/16/20 0001      Sensation   Light Touch Impaired by gross assessment   LT only on lat. side  of foot; abscent dorsum,plantar, med.                        [facility] Adult PT Treatment/Exercise - 08/16/20 0001      Ambulation/Gait   Ambulation/Gait Yes    Ambulation/Gait Assistance 5: Supervision    Ambulation Distance (Feet) 15 Feet   15, 15 limited by pt report of LE fatigue & weakness   Assistive device Rolling walker;R Forearm Crutch      Exercises   Exercises Knee/Hip;Ankle      Knee/Hip Exercises: Standing   Hip Flexion Stengthening;Right;Left;10 reps    Hip Flexion Limitations 2 sets    Other Standing Knee Exercises 3 step sIde steps L and R; 6 reps; 2 sets   counter     Knee/Hip Exercises: Seated   Long Arc Quad Strengthening;Right;Left;15 reps    Marching Strengthening;Right;Left;15 reps    Abduction/Adduction  Strengthening;Right;Left;15 reps    Sit to [employer] 10 reps;without UE support   2 sets                 PT Education - 08/16/20 2152    Education Details Instead of [facility] one hand on the RW and one on the armrest of the BSC, recommended using only the RW to complete the transfer. Trial this technique to see if it decreases the pt's risk of falling.    Person(s) Educated Patient    Methods Explanation;Demonstration    Comprehension Verbalized understanding            PT Short Term Goals - 07/28/20 1139      PT SHORT TERM GOAL #1   Title Patient will be I with HEP to improve strength and mobility    Baseline Patient demonstrated HEP appropriately and expressed understanding    Status Achieved      PT SHORT TERM GOAL #2   Title Patient will report consistency with exercises and performance of frequent short walks at home    Time 3    Period Weeks    Status New    Target Date 08/18/20             PT Long Term Goals - 07/28/20 1054      PT LONG TERM GOAL #1   Title Patient will be I with final HEP to maintain progress made from PT    Time 6    Period Weeks    Status New    Target Date  09/08/20      PT LONG TERM GOAL #2   Title Patient will be able to ambulate >/= 40 ft with RW in order to improve household  mobility    Time 6    Period Weeks    Status New    Target Date 09/08/20      PT LONG TERM GOAL #3   Title Patient will be able >/= 5 minutes without support in order to be able to get herself something to eat or drink    Time 6    Period Weeks    Status New    Target Date 09/08/20                 Plan - 08/16/20 2155    Clinical Impression Statement Instructed in technique to trial to see to see if only usin the RW for the [facility] transfers helps to prevent falls. Pt fell this AM transferring from the bed to [facility]. Pt states her R leg gave out. Continued PT for LE strengthening and functional mobility. Pt was able to complete STS from a mat table without the use of her hands. Sensation assessment found the R foot to be markedly decreased. Only the lateral aspect of her R foot was intact for ligth touch. Marked limited sensation of the R foot is also a contributor to her R leg giving out causing her falls as well a decrease strength.    Personal Factors and Comorbidities Fitness;Past/Current Experience;Time since onset of injury/illness/exacerbation;Transportation    Comorbidities Spina bifida of lumbosacral region with hydrocephalus, history of Chiari malformation, pevious ankle surgeries, BMI, chronic back pain    Examination-Activity Limitations Bathing;Locomotion Level;Transfers;Dressing;Hygiene/Grooming;Stand;Stairs;Squat;Sleep    Examination-Participation Restrictions Meal Prep;Cleaning;Occupation;Community Activity;Driving;Shop;Laundry;Yard Work    Conservation officer, historic buildings Evolving/Moderate complexity    Clinical Decision Making Moderate    Rehab Potential Good    PT Frequency 2x / week    PT Duration 6 weeks    PT Treatment/Interventions ADLs/Self Care Home Management;Cryotherapy;Electrical Stimulation;Iontophoresis 4mg /ml Dexamethasone;Moist  Heat;Neuromuscular re-education;Balance training;Therapeutic exercise;Therapeutic activities;Functional mobility training;Stair training;Gait training;Patient/family education;Manual techniques;Passive range of motion;Dry needling;Joint Manipulations;Taping;Vasopneumatic Device    PT Next Visit Plan Progress ther ex/HEP and functional mobility aa tolerated.    PT Home Exercise Plan [id number]: ankle pumps (ankle AROM all directions), supine heel slide, supine marching, supine hip abduction/adduction, bridge, LAQ, seated marching, standing lateral weight shifts; forward/backward steps c RW, STS from reciner t/f RW.    Consulted and Agree with Plan of Care Patient           Patient will benefit from skilled therapeutic intervention in order to improve the following deficits and impairments:  Abnormal gait, Difficulty walking, Decreased activity tolerance, Pain, Decreased strength, Decreased mobility, Increased edema, Decreased endurance, Decreased balance  Visit Diagnosis: Other abnormalities of gait and mobility  Muscle weakness (generalized)  Pain in right ankle and joints of right foot  Stiffness of right ankle, not elsewhere classified  Localized edema     Problem List Patient Active Problem List   Diagnosis Date Noted  . Post-traumatic arthritis of ankle, right 12/23/2018  . Closed right ankle fracture 06/14/2018  . Hypoalbuminemia due to protein-calorie malnutrition (HCC)   . Acute blood loss anemia   . 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 History of Chiari malformation   . Trimalleolar fracture of ankle, closed, left, initial encounter 04/29/2018  . Closed displaced trimalleolar fracture of right ankle 04/23/2018   04/25/2018 MS, PT 08/16/20 10:14 PM  [facility] Outpatient Rehabilitation [facility] [address] [place], [place], [place] Phone: [phone]   Fax:  [phone]  Name: CARALEE MOREA MRN: Shirline Frees Date of  Birth: 21-Jul-1971

## 2020-08-21 ENCOUNTER — Ambulatory Visit: Payer: No Typology Code available for payment source

## 2020-08-23 ENCOUNTER — Other Ambulatory Visit: Payer: Self-pay

## 2020-08-23 ENCOUNTER — Ambulatory Visit: Payer: No Typology Code available for payment source

## 2020-08-23 DIAGNOSIS — M6281 Muscle weakness (generalized): Secondary | ICD-10-CM

## 2020-08-23 DIAGNOSIS — R600 Localized edema: Secondary | ICD-10-CM

## 2020-08-23 DIAGNOSIS — R2689 Other abnormalities of gait and mobility: Secondary | ICD-10-CM

## 2020-08-23 DIAGNOSIS — M25671 Stiffness of right ankle, not elsewhere classified: Secondary | ICD-10-CM

## 2020-08-23 DIAGNOSIS — M25571 Pain in right ankle and joints of right foot: Secondary | ICD-10-CM

## 2020-08-24 NOTE — Therapy (Signed)
[facility] [address] [place], [place], [address] Phone: [phone]   Fax:  [phone]  Physical Therapy Treatment  Patient Details  Name: [name] MRN: [id number] Date of Birth: 1971-02-02 Referring Provider (PT): Erick Colace, MD   Encounter Date: 08/23/2020   PT End of Session - 08/23/20 1901    Visit Number 8    Number of Visits 13    Date for PT Re-Evaluation 09/08/20    Authorization Type Worker's Comp    Authorization Time Period 12 visits (2x/week for 6 weeks)    Authorization - Visit Number 7    Authorization - Number of Visits 12    PT Start Time 1836    PT Stop Time 1920    PT Time Calculation (min) 44 min    Activity Tolerance Patient tolerated treatment well    Behavior During Therapy WFL for tasks assessed/performed           Past Medical History:  Diagnosis Date   Chiari malformation type II (HCC)    Family history of adverse reaction to anesthesia    grandfather died from anesthesia; grandfather had malignant hyperthermia   GERD (gastroesophageal reflux disease)    Hydrocephalus (HCC)    Malignant hyperthermia    Neuromuscular disorder (HCC)    Neuropathy   Osteoarthritis    PONV (postoperative nausea and vomiting)    Spina bifida (HCC)     Past Surgical History:  Procedure Laterality Date   ANKLE ARTHROSCOPY Left    related to Tendon   ANKLE FUSION Right 12/23/2018   Procedure: right tibiotalar calcaneal arthrodesis;  Surgeon: Toni Arthurs, MD;  Location: [facility] OR;  Service: Orthopedics;  Laterality: Right;    ANKLE SURGERY Right 12/23/2018   BACK SURGERY  1971   Spinal Bifida   BACK SURGERY  2012   fusion   EXTERNAL FIXATION LEG Right 05/18/2018   Procedure: EXTERNAL FIXATION LEG;  Surgeon: Yolonda Kida, MD;  Location: [facility] OR;  Service: Orthopedics;  Laterality: Right;   EXTERNAL FIXATION REMOVAL Right 06/14/2018   Procedure: Right ankle  external fixator removal with steinman pin placement;  Surgeon: Yolonda Kida, MD;  Location: [facility] OR;  Service: Orthopedics;  Laterality: Right;  60 mins   HARDWARE REMOVAL Right 05/18/2018   Procedure: HARDWARE REMOVAL;  Surgeon: Yolonda Kida, MD;  Location: [facility] OR;  Service: Orthopedics;  Laterality: Right;   HARDWARE REMOVAL Right 12/23/2018   Procedure: RIGHT ANKLE REMOVAL OF DEEP IMPLANTS;  Surgeon: Toni Arthurs, MD;  Location: [facility] OR;  Service: Orthopedics;  Laterality: Right;   KNEE ARTHROSCOPY Left 1983   ORIF ANKLE FRACTURE Right 04/23/2018   Procedure: OPEN REDUCTION INTERNAL FIXATION (ORIF) TRIMALLEOLAR ANKLE FRACTURE;  Surgeon: Yolonda Kida, MD;  Location: [facility] ORS;  Service: Orthopedics;  Laterality: Right;  120 mins   ORIF ANKLE FRACTURE Right 05/18/2018   Procedure: Right ankle open reduction internal fixation revision;  Surgeon: Yolonda Kida, MD;  Location: [facility] OR;  Service: Orthopedics;  Laterality: Right;  120 mins   SHUNT EXTERNALIZATION     Head x 2   TIBIA OSTEOTOMY Right 12/23/2018   Procedure: RIGHT MEDIAL MALLEOLUS OSTEOTOMY;  Surgeon: Toni Arthurs, MD;  Location: [facility] OR;  Service: Orthopedics;  Laterality: Right;   WISDOM TOOTH EXTRACTION      There were no vitals filed for this visit.   Subjective Assessment - 08/23/20 1858    Subjective Pt reports it feels like numbness  is creeping up her R leg the longer she is on her feet standing or walking, and as it spreads it feels like her R leg is going to give out.    Patient Stated Goals Improve ability to walk and stand in household    Currently in Pain? Yes    Pain Score 3     Pain Location Ankle    Pain Orientation Right;Medial;Lateral    Pain Descriptors / Indicators Aching;Tingling    Pain Type Chronic pain    Pain Onset More than a month ago    Pain Frequency Intermittent    Aggravating Factors  WBing    Pain Relieving Factors Rest    Effect of Pain on Daily Activities Limited  general mobility                             [facility] Adult PT Treatment/Exercise - 08/24/20 0001      Ambulation/Gait   Ambulation/Gait Yes    Ambulation/Gait Assistance 5: Supervision    Ambulation Distance (Feet) 30 Feet   2nd attempt=30 ft;distance is limited by numbess and fatigue   Assistive device Rolling walker;R Forearm Crutch      Exercises   Exercises Knee/Hip;Ankle      Knee/Hip Exercises: Standing   Hip Flexion Stengthening;2 sets;10 reps   sitting rest break was between each set   Hip Flexion Limitations 2 sets    Hip Abduction Stengthening;Right;Left;10 reps;2 sets   sitting rest break was between each set   Lateral Step Up Right;3 sets;10 reps;Step Height: 2"   sitting rest break was between each set                   PT Short Term Goals - 08/23/20 1939      PT SHORT TERM GOAL #2   Title Patient will report consistency with exercises and performance of frequent short walks at home. Achieved-  Walks are short and completed with A from husband    Status Achieved    Target Date 08/23/20             PT Long Term Goals - 08/23/20 1942      PT LONG TERM GOAL #1   Title Patient will be I with final HEP to maintain progress made from PT. On-going    Period Weeks    Status On-going    Target Date 09/08/20      PT LONG TERM GOAL #2   Title Patient will be able to ambulate >/= 40 ft with RW in order to improve household mobility. On-going- 30 ft c RW and A for safety    Time 6    Period Weeks    Status On-going    Target Date 09/08/20      PT LONG TERM GOAL #3   Title Patient will be able >/= 5 minutes without support in order to be able to get herself something to eat or drink. On-going- Pt is completing standing activities at counter c rest break needed after each stand    Time 6    Period Weeks    Status New    Target Date 09/08/20                 Plan - 08/23/20 1946    Clinical Impression Statement Pt reports no new  falls since the last PT session. She reports she has been using the RW for asssitance during  the transfer process to the [facility]. Pt's walking distance is better since last weeks fall with her walking 79ft x 2 c the RW and SBA for safety. PT continues to work on strengthening, transfers, standing and walking tolerance. Pt will be on vacation next week and PT will be continued on 09/11/20. Pt will benefit from PT 1w5 to continue rehab to maximize pt's functional mobility and independence.    Personal Factors and Comorbidities Fitness;Past/Current Experience;Time since onset of injury/illness/exacerbation;Transportation    Comorbidities Spina bifida of lumbosacral region with hydrocephalus, history of Chiari malformation, pevious ankle surgeries, BMI, chronic back pain    Examination-Activity Limitations Bathing;Locomotion Level;Transfers;Dressing;Hygiene/Grooming;Stand;Stairs;Squat;Sleep    Examination-Participation Restrictions Meal Prep;Cleaning;Occupation;Community Activity;Driving;Shop;Laundry;Yard Work    Conservation officer, historic buildings Evolving/Moderate complexity    Clinical Decision Making Moderate    Rehab Potential Good    PT Frequency 2x / week    PT Duration 6 weeks    PT Treatment/Interventions ADLs/Self Care Home Management;Cryotherapy;Electrical Stimulation;Iontophoresis 4mg /ml Dexamethasone;Moist Heat;Neuromuscular re-education;Balance training;Therapeutic exercise;Therapeutic activities;Functional mobility training;Stair training;Gait training;Patient/family education;Manual techniques;Passive range of motion;Dry needling;Joint Manipulations;Taping;Vasopneumatic Device    PT Next Visit Plan Progress ther ex/HEP and functional mobility aa tolerated.    PT Home Exercise Plan [id number]: ankle pumps (ankle AROM all directions), supine heel slide, supine marching, supine hip abduction/adduction, bridge, LAQ, seated marching, standing lateral weight shifts; forward/backward steps c RW, STS from  reciner t/f RW.    Consulted and Agree with Plan of Care Patient           Patient will benefit from skilled therapeutic intervention in order to improve the following deficits and impairments:  Abnormal gait, Difficulty walking, Decreased activity tolerance, Pain, Decreased strength, Decreased mobility, Increased edema, Decreased endurance, Decreased balance  Visit Diagnosis: Other abnormalities of gait and mobility  Muscle weakness (generalized)  Pain in right ankle and joints of right foot  Stiffness of right ankle, not elsewhere classified  Localized edema     Problem List Patient Active Problem List   Diagnosis Date Noted   Post-traumatic arthritis of ankle, right 12/23/2018   Closed right ankle fracture 06/14/2018   Hypoalbuminemia due to protein-calorie malnutrition (HCC)    Acute blood loss anemia    Spina bifida of lumbosacral region with hydrocephalus [facility])    History of Chiari malformation    Trimalleolar fracture of ankle, closed, left, initial encounter 04/29/2018   Closed displaced trimalleolar fracture of right ankle 04/23/2018   04/25/2018 MS, PT 08/24/20 7:11 AM  [facility] [address] [place], [place], [place] Phone: [phone]   Fax:  [phone]  Name: [name] MRN: [id number] Frees Date of Birth: 1971-02-03

## 2020-09-07 ENCOUNTER — Other Ambulatory Visit: Payer: Self-pay

## 2020-09-07 ENCOUNTER — Encounter: Payer: Self-pay | Admitting: Physical Medicine & Rehabilitation

## 2020-09-07 ENCOUNTER — Encounter
Payer: No Typology Code available for payment source | Attending: Physical Medicine & Rehabilitation | Admitting: Physical Medicine & Rehabilitation

## 2020-09-07 VITALS — BP 119/92 | HR 85 | Temp 98.4°F | Ht 60.0 in | Wt 290.0 lb

## 2020-09-07 DIAGNOSIS — R5381 Other malaise: Secondary | ICD-10-CM

## 2020-09-07 DIAGNOSIS — M19171 Post-traumatic osteoarthritis, right ankle and foot: Secondary | ICD-10-CM | POA: Insufficient documentation

## 2020-09-07 DIAGNOSIS — Q052 Lumbar spina bifida with hydrocephalus: Secondary | ICD-10-CM | POA: Insufficient documentation

## 2020-09-07 NOTE — Progress Notes (Signed)
Subjective:    Patient ID: [name], female    DOB: [birth year], 49 y.o.   MRN: [id number] history of spina bifida and type II Chiari malformation with who is referred by Dr. Victorino Dike for right ankle and foot pain.  The patient had a work related injury when she was employed at [employer] on 04/12/2018.  The patient  presented to the [facility] emergency department. She underwent closed reduction of the fracture dislocation. The patient followed up with Dr. Duwayne Heck and underwent right ankle open reduction internal fixation of a trimalleolar fracture. The patient was seen at: Inpatient rehab unit, Dr. Ivory Broad was the attending physician.  The patient was nonweightbearing.  On admission to rehab required mod assist stand pivot transfers mod to max with ADLs.  Upon discharge from rehab the patient required supervision for car transfers with sliding board.  Modified independent upper extremity ADLs but did require assistance for lower body ADLs. Has had follow up with ortho foot and ankle Dr Victorino Dike fusion is stable  Pt received home health PT after CIR admission.  The patient and husband state that she has not been able to access the bathroom with her scooter.  Rehab discharge summary indicated that she could access the bathroom but it did indicate whether this was with a wheelchair. On 05/18/2018 patient had hardware removal due to loss of fixation with placement of external fixator. On 06/14/2018 was removal of external fixator with placement of Steinmann pin On 12/27/2018 patient underwent right ankle tibiotalar calcaneal arthrodesis by Dr. Victorino Dike HPI The patient returns today after last visit in July.  She has started outpatient therapy.  She is now ambulating with supervision of a therapist for short distances.  PT notes indicate 30 feet although the patient disputes this and states maybe it was more like 20 feet. The patient is requesting a 0 turn electric wheelchair.  We discussed that  my equipment recommendations are based on therapy evaluations once maximal medical improvement from therapy are obtained.  She is not at that point yet. She is concerned about a near fall she fell against the wall injuring her left shoulder it is feeling better.  She still does require assistance for transfers and ADLs. Patient is accompanied by her son who is multiple questions regarding the impact of her previous issues with spina bifida, her fall as well as subsequent development of right lower extremity numbness.  Patient has been seen by neurosurgery as well as neurology.  From Neurosurgery note 02/02/2020 "She feels like the numbness in her lower extremities that has been present since her nerve block from her ankle surgery. She states that this occurred immediately after the surgery and was not present before, which makes me feel like it is likely related to the surgery in some fashion, perhaps due to the block or positioning. She is very concerned about this. I explained that it is unlikely that we would be able to help this at this point, but a EMG/nerve conduction velocity test may help to determine the etiology of this. She would like to have this performed and we will arrange this. " Patient indicates that subsequently neurosurgery decided against EMG referral.  We also discussed that from a technical standpoint based on her high BMI it would definitely be difficult to get a good technical study Interval history she has had urological gynecology evaluation, this has resulted in a subsequent referral to bariatric surgery.  Also reviewed neurosurgery notes 04/06/2018, prior to fall  Dr. Channing Mutters neurosurgery nurse practitioner, documented that sensation was intact in the right lower extremity although did have some burning pain in the right leg. Pain Inventory Average Pain 3 Pain Right Now 0 My pain is aching  In the last 24 hours, has pain interfered with the following? General activity 0 Relation  with others 0 Enjoyment of life 0 What TIME of day is your pain at its worst? varies Sleep (in general) NA  Pain is worse with: some activites Pain improves with: rest and medication Relief from Meds: no pain only occasional  Family History  Problem Relation Age of Onset  . Non-Hodgkin's lymphoma Mother   . Hypertension Mother   . Hypertension Father    Social History   Socioeconomic History  . Marital status: Married    Spouse name: Not on file  . Number of children: Not on file  . Years of education: Not on file  . Highest education level: Not on file  Occupational History  . Not on file  Tobacco Use  . Smoking status: Never Smoker  . Smokeless tobacco: Never Used  Vaping Use  . Vaping Use: Never used  Substance and Sexual Activity  . Alcohol use: Never  . Drug use: Never  . Sexual activity: Not on file  Other Topics Concern  . Not on file  Social History Narrative  . Not on file   Social Determinants of Health   Financial Resource Strain:   . Difficulty of Paying Living Expenses: Not on file  Food Insecurity:   . Worried About Programme researcher, broadcasting/film/video in the Last Year: Not on file  . Ran Out of Food in the Last Year: Not on file  Transportation Needs:   . Lack of Transportation (Medical): Not on file  . Lack of Transportation (Non-Medical): Not on file  Physical Activity:   . Days of Exercise per Week: Not on file  . Minutes of Exercise per Session: Not on file  Stress:   . Feeling of Stress : Not on file  Social Connections:   . Frequency of Communication with Friends and Family: Not on file  . Frequency of Social Gatherings with Friends and Family: Not on file  . Attends Religious Services: Not on file  . Active Member of Clubs or Organizations: Not on file  . Attends Banker Meetings: Not on file  . Marital Status: Not on file   Past Surgical History:  Procedure Laterality Date  . ANKLE ARTHROSCOPY Left    related to Tendon  . ANKLE  FUSION Right 12/23/2018   Procedure: right tibiotalar calcaneal arthrodesis;  Surgeon: Toni Arthurs, MD;  Location: [facility] OR;  Service: Orthopedics;  Laterality: Right;   . ANKLE SURGERY Right 12/23/2018  . BACK SURGERY  1971   Spinal Bifida  . BACK SURGERY  2012   fusion  . EXTERNAL FIXATION LEG Right 05/18/2018   Procedure: EXTERNAL FIXATION LEG;  Surgeon: Yolonda Kida, MD;  Location: [facility] OR;  Service: Orthopedics;  Laterality: Right;  . EXTERNAL FIXATION REMOVAL Right 06/14/2018   Procedure: Right ankle external fixator removal with steinman pin placement;  Surgeon: Yolonda Kida, MD;  Location: [facility] OR;  Service: Orthopedics;  Laterality: Right;  60 mins  . HARDWARE REMOVAL Right 05/18/2018   Procedure: HARDWARE REMOVAL;  Surgeon: Yolonda Kida, MD;  Location: [facility] OR;  Service: Orthopedics;  Laterality: Right;  . HARDWARE REMOVAL Right 12/23/2018   Procedure: RIGHT ANKLE REMOVAL OF DEEP  IMPLANTS;  Surgeon: Toni ArthursHewitt, John, MD;  [facility];  Service: Orthopedics;  Laterality: Right;  . KNEE ARTHROSCOPY Left 1983  . ORIF ANKLE FRACTURE Right 04/23/2018   Procedure: OPEN REDUCTION INTERNAL FIXATION (ORIF) TRIMALLEOLAR ANKLE FRACTURE;  Surgeon: Yolonda Kidaogers, Jason Patrick, MD;  Location: [facility] ORS;  Service: Orthopedics;  Laterality: Right;  120 mins  . ORIF ANKLE FRACTURE Right 05/18/2018   Procedure: Right ankle open reduction internal fixation revision;  Surgeon: Yolonda Kidaogers, Jason Patrick, MD;  [facility];  Service: Orthopedics;  Laterality: Right;  120 mins  . SHUNT EXTERNALIZATION     Head x 2  . TIBIA OSTEOTOMY Right 12/23/2018   Procedure: RIGHT MEDIAL MALLEOLUS OSTEOTOMY;  Surgeon: Toni ArthursHewitt, John, MD;  Location: [facility] OR;  Service: Orthopedics;  Laterality: Right;  . WISDOM TOOTH EXTRACTION     Past Surgical History:  Procedure Laterality Date  . ANKLE ARTHROSCOPY Left    related to Tendon  . ANKLE FUSION Right 12/23/2018   Procedure: right tibiotalar calcaneal  arthrodesis;  Surgeon: Toni ArthursHewitt, John, MD;  [facility];  Service: Orthopedics;  Laterality: Right;  180min  . ANKLE SURGERY Right 12/23/2018  . BACK SURGERY  1971   Spinal Bifida  . BACK SURGERY  2012   fusion  . EXTERNAL FIXATION LEG Right 05/18/2018   Procedure: EXTERNAL FIXATION LEG;  Surgeon: Yolonda Kidaogers, Jason Patrick, MD;  [facility];  Service: Orthopedics;  Laterality: Right;  . EXTERNAL FIXATION REMOVAL Right 06/14/2018   Procedure: Right ankle external fixator removal with steinman pin placement;  Surgeon: Yolonda Kidaogers, Jason Patrick, MD;  [facility];  Service: Orthopedics;  Laterality: Right;  60 mins  . HARDWARE REMOVAL Right 05/18/2018   Procedure: HARDWARE REMOVAL;  Surgeon: Yolonda Kidaogers, Jason Patrick, MD;  [facility];  Service: Orthopedics;  Laterality: Right;  . HARDWARE REMOVAL Right 12/23/2018   Procedure: RIGHT ANKLE REMOVAL OF DEEP IMPLANTS;  Surgeon: Toni ArthursHewitt, John, MD;  Location: [facility] OR;  Service: Orthopedics;  Laterality: Right;  . KNEE ARTHROSCOPY Left 1983  . ORIF ANKLE FRACTURE Right 04/23/2018   Procedure: OPEN REDUCTION INTERNAL FIXATION (ORIF) TRIMALLEOLAR ANKLE FRACTURE;  Surgeon: Yolonda Kidaogers, Jason Patrick, MD;  [facility];  Service: Orthopedics;  Laterality: Right;  120 mins  . ORIF ANKLE FRACTURE Right 05/18/2018   Procedure: Right ankle open reduction internal fixation revision;  Surgeon: Yolonda Kidaogers, Jason Patrick, MD;  [facility];  Service: Orthopedics;  Laterality: Right;  120 mins  . SHUNT EXTERNALIZATION     Head x 2  . TIBIA OSTEOTOMY Right 12/23/2018   Procedure: RIGHT MEDIAL MALLEOLUS OSTEOTOMY;  Surgeon: Toni ArthursHewitt, John, MD;  Location: [facility] OR;  Service: Orthopedics;  Laterality: Right;  . WISDOM TOOTH EXTRACTION     Past Medical History:  Diagnosis Date  . Chiari malformation type II (HCC)   . Family history of adverse reaction to anesthesia    grandfather died from anesthesia; grandfather had malignant hyperthermia  . GERD (gastroesophageal reflux  disease)   . Hydrocephalus (HCC)   . Malignant hyperthermia   . Neuromuscular disorder (HCC)    Neuropathy  . Osteoarthritis   . PONV (postoperative nausea and vomiting)   . Spina bifida (HCC)    BP (!) 119/92   Pulse 85   Temp 98.4 F (36.9 C)   Ht 5' (1.524 m)   Wt 290 lb (131.5 kg) Comment: last recorded  SpO2 97%   BMI 56.64 kg/m   Opioid Risk Score:   Fall Risk Score:  `1  Depression screen PHQ 2/9  Depression screen [facility] 2/9 09/07/2020 04/03/2020  Decreased Interest 1 2  Down, Depressed, Hopeless 1 2  PHQ - 2 Score 2 4  Altered sleeping - 2  Tired, decreased energy - 2  Change in appetite - 2  Feeling bad or failure about yourself  - 2  Trouble concentrating - 2  Moving slowly or fidgety/restless - 0  Suicidal thoughts - 0  PHQ-9 Score - 14  Difficult doing work/chores - Extremely dIfficult    Review of Systems  Constitutional: Negative.   HENT: Negative.   Eyes: Negative.   Respiratory: Negative.   Cardiovascular: Negative.   Gastrointestinal: Negative.   Genitourinary: Negative.   Musculoskeletal: Positive for gait problem.       Left shoulder - feel and hurt it last weekend  Skin: Negative.   Allergic/Immunologic: Negative.   Neurological: Positive for weakness.  Hematological: Negative.   Psychiatric/Behavioral: Positive for dysphoric mood.  All other systems reviewed and are negative.      Objective:   Physical Exam Vitals and nursing note reviewed.  Constitutional:      Appearance: She is obese.  Eyes:     Extraocular Movements: Extraocular movements intact.     Conjunctiva/sclera: Conjunctivae normal.     Pupils: Pupils are equal, round, and reactive to light.  Skin:    General: Skin is warm and dry.  Neurological:     Mental Status: She is alert and oriented to person, place, and time.     Comments: 5/5 bilateral deltoid bicep tricep grip  5/5 in left hip flexor knee extensor ankle dorsiflexor 4/5 in the right knee extensor 3 - in  the hip flexor and 3 - ankle dorsiflexor plantar flexor, 2 - at the foot inverters and everters. Sensation reduced in the medial gravity lateral thigh of the right lower extremity as well as the medial and lateral leg as well as the dorsum and the plan of surface of the foot.  Psychiatric:        Mood and Affect: Mood normal.        Behavior: Behavior normal.        Thought Content: Thought content normal.        Judgment: Judgment normal.   No pain with shoulder range of motion bilaterally        Assessment & Plan:  #1.  History of trimalleolar fracture.  The patient has had decline in functional status following ORIF of the ankle but get back modified independent level transfers and supervision sliding board car transfers after inpatient rehabilitation, still was at a nonweightbearing status at that time in 2019. Subsequently has undergone external fixator placement and hardware removal in June 2019and eventually a right ankle arthrodesis 12/27/2018 As discussed with patient and her son patient has comorbidities of spina bifida with myelomalacia as well as morbid obesity. Patient still has the potential to get back to modified independent level with transfers as well as short distance ambulation with the walker. Patient still not at MMI, return to clinic in 4 to 6 weeks to follow-up on progress Recommend OT to assess equipment needs as well as improve basic and complex ADLs Recommend continue outpatient PT for mobility, balance, reduce fall risk Do think weight loss would also be helpful for overall functional mobility

## 2020-09-11 ENCOUNTER — Other Ambulatory Visit: Payer: Self-pay

## 2020-09-11 ENCOUNTER — Ambulatory Visit: Payer: No Typology Code available for payment source | Attending: Physical Medicine & Rehabilitation

## 2020-09-11 DIAGNOSIS — M25671 Stiffness of right ankle, not elsewhere classified: Secondary | ICD-10-CM | POA: Diagnosis present

## 2020-09-11 DIAGNOSIS — M6281 Muscle weakness (generalized): Secondary | ICD-10-CM

## 2020-09-11 DIAGNOSIS — M25571 Pain in right ankle and joints of right foot: Secondary | ICD-10-CM

## 2020-09-11 DIAGNOSIS — R600 Localized edema: Secondary | ICD-10-CM | POA: Diagnosis present

## 2020-09-11 DIAGNOSIS — R2689 Other abnormalities of gait and mobility: Secondary | ICD-10-CM | POA: Diagnosis not present

## 2020-09-11 NOTE — Therapy (Signed)
[facility] Outpatient Rehabilitation [facility] [address] [place], [place], [address] Phone: [phone]   Fax:  [phone]  Physical Therapy Treatment  Patient Details  Name: [name] MRN: [id number] Date of Birth: June 04, 1971 Referring Provider (PT): Wynn Banker Victorino Sparrow, MD   Encounter Date: 09/11/2020   PT End of Session - 09/11/20 1824    Visit Number 9    Number of Visits 13    Date for PT Re-Evaluation 09/08/20    Authorization Type Worker's Comp    Authorization Time Period 12 visits (2x/week for 6 weeks)    Authorization - Visit Number 7    Authorization - Number of Visits 12    PT Start Time 1817    PT Stop Time 1900    PT Time Calculation (min) 43 min    Activity Tolerance Patient tolerated treatment well    Behavior During Therapy [facility] for tasks assessed/performed           Past Medical History:  Diagnosis Date  . Chiari malformation type II (HCC)   . Family history of adverse reaction to anesthesia    grandfather died from anesthesia; grandfather had malignant hyperthermia  . GERD (gastroesophageal reflux disease)   . Hydrocephalus (HCC)   . Malignant hyperthermia   . Neuromuscular disorder (HCC)    Neuropathy  . Osteoarthritis   . PONV (postoperative nausea and vomiting)   . Spina bifida [facility])     Past Surgical History:  Procedure Laterality Date  . ANKLE ARTHROSCOPY Left    related to Tendon  . ANKLE FUSION Right 12/23/2018   Procedure: right tibiotalar calcaneal arthrodesis;  Surgeon: Toni Arthurs, MD;  Location: [facility] OR;  Service: Orthopedics;  Laterality: Right;   . ANKLE SURGERY Right 12/23/2018  . BACK SURGERY  1971   Spinal Bifida  . BACK SURGERY  2012   fusion  . EXTERNAL FIXATION LEG Right 05/18/2018   Procedure: EXTERNAL FIXATION LEG;  Surgeon: Yolonda Kida, MD;  Location: [facility] OR;  Service: Orthopedics;  Laterality: Right;  . EXTERNAL FIXATION REMOVAL Right 06/14/2018   Procedure: Right ankle  external fixator removal with steinman pin placement;  Surgeon: Yolonda Kida, MD;  Location: [facility] OR;  Service: Orthopedics;  Laterality: Right;  60 mins  . HARDWARE REMOVAL Right 05/18/2018   Procedure: HARDWARE REMOVAL;  Surgeon: Yolonda Kida, MD;  Location: [facility] OR;  Service: Orthopedics;  Laterality: Right;  . HARDWARE REMOVAL Right 12/23/2018   Procedure: RIGHT ANKLE REMOVAL OF DEEP IMPLANTS;  Surgeon: Toni Arthurs, MD;  Location: [facility] OR;  Service: Orthopedics;  Laterality: Right;  . KNEE ARTHROSCOPY Left 1983  . ORIF ANKLE FRACTURE Right 04/23/2018   Procedure: OPEN REDUCTION INTERNAL FIXATION (ORIF) TRIMALLEOLAR ANKLE FRACTURE;  Surgeon: Yolonda Kida, MD;  Location: [facility] ORS;  Service: Orthopedics;  Laterality: Right;  120 mins  . ORIF ANKLE FRACTURE Right 05/18/2018   Procedure: Right ankle open reduction internal fixation revision;  Surgeon: Yolonda Kida, MD;  Location: [facility] OR;  Service: Orthopedics;  Laterality: Right;  120 mins  . SHUNT EXTERNALIZATION     Head x 2  . TIBIA OSTEOTOMY Right 12/23/2018   Procedure: RIGHT MEDIAL MALLEOLUS OSTEOTOMY;  Surgeon: Toni Arthurs, MD;  Location: [facility] OR;  Service: Orthopedics;  Laterality: Right;  . WISDOM TOOTH EXTRACTION      There were no vitals filed for this visit.   Subjective Assessment - 09/11/20 1822    Subjective Pt reports her R leg is  about the same. She fell weekend before last trying to get into the tub, hurting her left shoulder. It hurts certain ways she moves it in deltoid medially.    Patient Stated Goals Improve ability to walk and stand in household    Currently in Pain? No/denies    Pain Onset More than a month ago                             [facility] Adult PT Treatment/Exercise - 09/11/20 0001      Ambulation/Gait   Ambulation/Gait Yes    Ambulation/Gait Assistance 5: Supervision    Ambulation Distance (Feet) 30 Feet   2nd attempt=30 ft;distance is limited by numbess and  fatigue   Assistive device Rolling walker   bariatric     Self-Care   Self-Care Other Self-Care Comments    Other Self-Care Comments  Pelvic floor/ADD work with ball in sitting to address incontinence, alarms in phone to exercise every hour      Knee/Hip Exercises: Standing   Hip Flexion Stengthening;2 sets;10 reps   sitting rest break was between each set   Hip Flexion Limitations 2 sets    Hip Abduction Stengthening;Right;Left;10 reps;2 sets   sitting rest break was between each set     Knee/Hip Exercises: Seated   Abduction/Adduction  Strengthening;Both;1 set;10 reps    Abd/Adduction Limitations ball                    PT Short Term Goals - 08/23/20 1939      PT SHORT TERM GOAL #2   Title Patient will report consistency with exercises and performance of frequent short walks at home. Achieved-  Walks are short and completed with A from husband    Status Achieved    Target Date 08/23/20             PT Long Term Goals - 08/23/20 1942      PT LONG TERM GOAL #1   Title Patient will be I with final HEP to maintain progress made from PT. On-going    Period Weeks    Status On-going    Target Date 09/08/20      PT LONG TERM GOAL #2   Title Patient will be able to ambulate >/= 40 ft with RW in order to improve household mobility. On-going- 30 ft c RW and A for safety    Time 6    Period Weeks    Status On-going    Target Date 09/08/20      PT LONG TERM GOAL #3   Title Patient will be able >/= 5 minutes without support in order to be able to get herself something to eat or drink. On-going- Pt is completing standing activities at counter c rest break needed after each stand    Time 6    Period Weeks    Status New    Target Date 09/08/20                 Plan - 09/11/20 1833    Clinical Impression Statement Pt edu for proper RW use to incr safety during transfers due to pt abruptly sitting down. Pt edu on pelvic floor strengthening with seated ball ADD  after pt told therapist of incontinence occurring 3-8x/day.    Personal Factors and Comorbidities Fitness;Past/Current Experience;Time since onset of injury/illness/exacerbation;Transportation    Comorbidities Spina bifida of lumbosacral region with hydrocephalus, history of Chiari  malformation, pevious ankle surgeries, BMI, chronic back pain    Examination-Activity Limitations Bathing;Locomotion Level;Transfers;Dressing;Hygiene/Grooming;Stand;Stairs;Squat;Sleep    Examination-Participation Restrictions Meal Prep;Cleaning;Occupation;Community Activity;Driving;Shop;Laundry;Yard Work    Conservation officer, historic buildings Evolving/Moderate complexity    Rehab Potential Good    PT Frequency 2x / week    PT Duration 6 weeks    PT Treatment/Interventions ADLs/Self Care Home Management;Cryotherapy;Electrical Stimulation;Iontophoresis 4mg /ml Dexamethasone;Moist Heat;Neuromuscular re-education;Balance training;Therapeutic exercise;Therapeutic activities;Functional mobility training;Stair training;Gait training;Patient/family education;Manual techniques;Passive range of motion;Dry needling;Joint Manipulations;Taping;Vasopneumatic Device    PT Next Visit Plan Progress ther ex/HEP and functional mobility aa tolerated; assess response to ball ADD in sitting, alarm reminders for exercise    PT Home Exercise Plan [id number]: ankle pumps (ankle AROM all directions), supine heel slide, supine marching, supine hip abduction/adduction, bridge, LAQ, seated marching, standing lateral weight shifts; forward/backward steps c RW, STS from reciner t/f RW.    Consulted and Agree with Plan of Care Patient           Patient will benefit from skilled therapeutic intervention in order to improve the following deficits and impairments:  Abnormal gait, Difficulty walking, Decreased activity tolerance, Pain, Decreased strength, Decreased mobility, Increased edema, Decreased endurance, Decreased balance  Visit Diagnosis: Other  abnormalities of gait and mobility  Muscle weakness (generalized)  Pain in right ankle and joints of right foot  Stiffness of right ankle, not elsewhere classified  Localized edema     Problem List Patient Active Problem List   Diagnosis Date Noted  . Post-traumatic arthritis of ankle, right 12/23/2018  . Closed right ankle fracture 06/14/2018  . Hypoalbuminemia due to protein-calorie malnutrition (HCC)   . Acute blood loss anemia   . 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 History of Chiari malformation   . Trimalleolar fracture of ankle, closed, left, initial encounter 04/29/2018  . Closed displaced trimalleolar fracture of right ankle 04/23/2018    04/25/2018, PT, DPT 09/11/2020, 7:14 PM  [facility] [address] [place], [place], [place] Phone: [phone]   Fax:  [phone]  Name: VONCILE SCHWARZ MRN: Shirline Frees Date of Birth: 09-14-1971

## 2020-09-19 ENCOUNTER — Ambulatory Visit: Payer: No Typology Code available for payment source

## 2020-09-19 ENCOUNTER — Other Ambulatory Visit: Payer: Self-pay

## 2020-09-19 DIAGNOSIS — R2689 Other abnormalities of gait and mobility: Secondary | ICD-10-CM | POA: Diagnosis not present

## 2020-09-19 DIAGNOSIS — M25671 Stiffness of right ankle, not elsewhere classified: Secondary | ICD-10-CM

## 2020-09-19 DIAGNOSIS — M6281 Muscle weakness (generalized): Secondary | ICD-10-CM

## 2020-09-19 DIAGNOSIS — M25571 Pain in right ankle and joints of right foot: Secondary | ICD-10-CM

## 2020-09-19 DIAGNOSIS — R600 Localized edema: Secondary | ICD-10-CM

## 2020-09-20 NOTE — Therapy (Signed)
[facility] Outpatient Rehabilitation [facility] [address] [place], [place], [address] Phone: [phone]   Fax:  [phone]  Physical Therapy Treatment/Recert  Patient Details  Name: [name] MRN: [id number] Date of Birth: 08-Jan-1971 Referring Provider (PT): Wynn Banker Victorino Sparrow, MD  Progress Note Reporting Period  07/04/20 to 09/19/20  See note below for Objective Data and Assessment of Progress/Goals.    Encounter Date: 09/19/2020   PT End of Session - 09/19/20 1843    Visit Number 10    Number of Visits 13    Date for PT Re-Evaluation 11/30/20    Authorization Type Worker's Comp          (2w12 for MD recert starting 09/09/20)    Authorization Time Period 12 visits (2x/week for 6 weeks)    PT Start Time 1835    PT Stop Time 1920    PT Time Calculation (min) 45 min    Equipment Utilized During Treatment --   power scooter   Activity Tolerance Patient tolerated treatment well    Behavior During Therapy WFL for tasks assessed/performed           Past Medical History:  Diagnosis Date  . Chiari malformation type II (HCC)   . Family history of adverse reaction to anesthesia    grandfather died from anesthesia; grandfather had malignant hyperthermia  . GERD (gastroesophageal reflux disease)   . Hydrocephalus (HCC)   . Malignant hyperthermia   . Neuromuscular disorder (HCC)    Neuropathy  . Osteoarthritis   . PONV (postoperative nausea and vomiting)   . Spina bifida [facility])     Past Surgical History:  Procedure Laterality Date  . ANKLE ARTHROSCOPY Left    related to Tendon  . ANKLE FUSION Right 12/23/2018   Procedure: right tibiotalar calcaneal arthrodesis;  Surgeon: Toni Arthurs, MD;  Location: [facility] OR;  Service: Orthopedics;  Laterality: Right;   . ANKLE SURGERY Right 12/23/2018  . BACK SURGERY  1971   Spinal Bifida  . BACK SURGERY  2012   fusion  . EXTERNAL FIXATION LEG Right 05/18/2018   Procedure: EXTERNAL FIXATION LEG;   Surgeon: Yolonda Kida, MD;  Location: [facility] OR;  Service: Orthopedics;  Laterality: Right;  . EXTERNAL FIXATION REMOVAL Right 06/14/2018   Procedure: Right ankle external fixator removal with steinman pin placement;  Surgeon: Yolonda Kida, MD;  Location: [facility] OR;  Service: Orthopedics;  Laterality: Right;  60 mins  . HARDWARE REMOVAL Right 05/18/2018   Procedure: HARDWARE REMOVAL;  Surgeon: Yolonda Kida, MD;  Location: [facility] OR;  Service: Orthopedics;  Laterality: Right;  . HARDWARE REMOVAL Right 12/23/2018   Procedure: RIGHT ANKLE REMOVAL OF DEEP IMPLANTS;  Surgeon: Toni Arthurs, MD;  Location: [facility] OR;  Service: Orthopedics;  Laterality: Right;  . KNEE ARTHROSCOPY Left 1983  . ORIF ANKLE FRACTURE Right 04/23/2018   Procedure: OPEN REDUCTION INTERNAL FIXATION (ORIF) TRIMALLEOLAR ANKLE FRACTURE;  Surgeon: Yolonda Kida, MD;  Location: [facility] ORS;  Service: Orthopedics;  Laterality: Right;  120 mins  . ORIF ANKLE FRACTURE Right 05/18/2018   Procedure: Right ankle open reduction internal fixation revision;  Surgeon: Yolonda Kida, MD;  Location: [facility] OR;  Service: Orthopedics;  Laterality: Right;  120 mins  . SHUNT EXTERNALIZATION     Head x 2  . TIBIA OSTEOTOMY Right 12/23/2018   Procedure: RIGHT MEDIAL MALLEOLUS OSTEOTOMY;  Surgeon: Toni Arthurs, MD;  Location: [facility] OR;  Service: Orthopedics;  Laterality: Right;  . WISDOM TOOTH EXTRACTION  There were no vitals filed for this visit.   Subjective Assessment - 09/20/20 1849    Subjective Pt asked this PT if her R lower leg was bowed. Pt states she does not feel confident with her R ankle, feeling like her foot is going to roll under. Pt reports she is using phone alarm reminders to complete her exs consistently.    Pertinent History Spina bifida of lumbosacral region with hydrocephalus, history of Chiari malformation, pevious ankle surgeries    Limitations Standing;House hold activities;Walking    How long can you sit  comfortably? No limitation    How long can you stand comfortably? < 1 minute    How long can you walk comfortably? < 1 minute    Patient Stated Goals Improve ability to walk and stand in household    Currently in Pain? No/denies    Pain Score 0-No pain    Pain Location Ankle    Pain Orientation Right                             [facility] Adult PT Treatment/Exercise - 09/20/20 0001      Ambulation/Gait   Ambulation/Gait Yes    Ambulation/Gait Assistance 5: Supervision    Ambulation Distance (Feet) 32 Feet   2nd attempt=32 ft;Distance is limited by numbess and fatigue   Assistive device Rolling walker    Gait Pattern Poor foot clearance - right    Gait Comments 3rd and 4th attempt=33ft each. On the 4th attempt the pt reported it felt like her R ankle was going to roll out.      Knee/Hip Exercises: Seated   Long Arc Quad Right;Left;10 reps    Long Arc Quad Limitations 3 sets    Marching Strengthening;Right;Left;2 sets;10 reps    Abduction/Adduction  Strengthening;10 reps;Right;Left;2 sets                    PT Short Term Goals - 08/23/20 1939      PT SHORT TERM GOAL #2   Title Patient will report consistency with exercises and performance of frequent short walks at home. Achieved-  Walks are short and completed with A from husband    Status Achieved    Target Date 08/23/20             PT Long Term Goals - 08/23/20 1942      PT LONG TERM GOAL #1   Title Patient will be I with final HEP to maintain progress made from PT. On-going    Period Weeks    Status On-going    Target Date 09/08/20      PT LONG TERM GOAL #2   Title Patient will be able to ambulate >/= 40 ft with RW in order to improve household mobility. On-going- 30 ft c RW and A for safety    Time 6    Period Weeks    Status On-going    Target Date 09/08/20      PT LONG TERM GOAL #3   Title Patient will be able >/= 5 minutes without support in order to be able to get herself  something to eat or drink. On-going- Pt is completing standing activities at counter c rest break needed after each stand    Time 6    Period Weeks    Status New    Target Date 09/08/20  Plan - 09/20/20 1903    Clinical Impression Statement It appears the pt's R tibia maybe bowed. The R LE is also swollow which might creating this visual. Pt was told xrays were the only way to determine if the R tibia was bowed. With sitting knee lifts the pt is able to clear the R foot 4 cm in comparison to the L at 18 cm. Decreased R foot clearance is observed during ambulation. Pt's gait tolerance is improving with increase walking distance up to 15ft from 6 ft on 07/28/20. Pt expresses concern about her R foot rolling under which she reports has occured at home. Pt will benefit from continued PT to increase LE strength in orer to maximize her safety and functional mobility at home.    Personal Factors and Comorbidities Fitness;Past/Current Experience;Time since onset of injury/illness/exacerbation;Transportation    Comorbidities Spina bifida of lumbosacral region with hydrocephalus, history of Chiari malformation, pevious ankle surgeries, BMI, chronic back pain    Examination-Activity Limitations Bathing;Locomotion Level;Transfers;Dressing;Hygiene/Grooming;Stand;Stairs;Squat;Sleep    Examination-Participation Restrictions Meal Prep;Cleaning;Occupation;Community Activity;Driving;Shop;Laundry;Yard Work    Stability/Clinical Decision Making Evolving/Moderate complexity    Clinical Decision Making Moderate    PT Frequency 2x / week    PT Duration 12 weeks    PT Treatment/Interventions ADLs/Self Care Home Management;Cryotherapy;Electrical Stimulation;Iontophoresis 4mg /ml Dexamethasone;Moist Heat;Neuromuscular re-education;Balance training;Therapeutic exercise;Therapeutic activities;Functional mobility training;Stair training;Gait training;Patient/family education;Manual techniques;Passive range  of motion;Dry needling;Joint Manipulations;Taping;Vasopneumatic Device    PT Next Visit Plan Progress ther ex/HEP and functional mobility aa tolerated; assess response to ball ADD in sitting    PT Home Exercise Plan [id number]: ankle pumps (ankle AROM all directions), supine heel slide, supine marching, supine hip abduction/adduction, bridge, LAQ, seated marching, standing lateral weight shifts; forward/backward steps c RW, STS from reciner t/f RW.    Consulted and Agree with Plan of Care Patient           Patient will benefit from skilled therapeutic intervention in order to improve the following deficits and impairments:  Abnormal gait, Difficulty walking, Decreased activity tolerance, Pain, Decreased strength, Decreased mobility, Increased edema, Decreased endurance, Decreased balance  Visit Diagnosis: Other abnormalities of gait and mobility - Plan: PT plan of care cert/re-cert  Muscle weakness (generalized) - Plan: PT plan of care cert/re-cert  Pain in right ankle and joints of right foot - Plan: PT plan of care cert/re-cert  Stiffness of right ankle, not elsewhere classified - Plan: PT plan of care cert/re-cert  Localized edema - Plan: PT plan of care cert/re-cert     Problem List Patient Active Problem List   Diagnosis Date Noted  . Post-traumatic arthritis of ankle, right 12/23/2018  . Closed right ankle fracture 06/14/2018  . Hypoalbuminemia due to protein-calorie malnutrition (HCC)   . Acute blood loss anemia   . 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 History of Chiari malformation   . Trimalleolar fracture of ankle, closed, left, initial encounter 04/29/2018  . Closed displaced trimalleolar fracture of right ankle 04/23/2018    04/25/2018 MS, PT 09/20/20 9:55 PM   [facility] [address] [place], [place], [place] Phone: [phone]   Fax:  [phone]  Name: CACI Stein MRN:  Shirline Frees Date of Birth: September 28, 1971

## 2020-10-05 ENCOUNTER — Other Ambulatory Visit (HOSPITAL_COMMUNITY): Payer: Self-pay | Admitting: Neurological Surgery

## 2020-10-05 ENCOUNTER — Other Ambulatory Visit: Payer: Self-pay | Admitting: Neurological Surgery

## 2020-10-05 DIAGNOSIS — G919 Hydrocephalus, unspecified: Secondary | ICD-10-CM

## 2020-10-10 ENCOUNTER — Other Ambulatory Visit: Payer: Self-pay

## 2020-10-10 ENCOUNTER — Ambulatory Visit (HOSPITAL_COMMUNITY)
Admission: RE | Admit: 2020-10-10 | Discharge: 2020-10-10 | Disposition: A | Discharge status: Home / Self Care | Payer: Medicare Other | Source: Ambulatory Visit | Attending: Neurological Surgery | Admitting: Neurological Surgery

## 2020-10-10 DIAGNOSIS — G919 Hydrocephalus, unspecified: Secondary | ICD-10-CM | POA: Insufficient documentation

## 2020-10-11 ENCOUNTER — Ambulatory Visit: Payer: No Typology Code available for payment source

## 2020-10-13 ENCOUNTER — Other Ambulatory Visit: Payer: Self-pay

## 2020-10-13 ENCOUNTER — Ambulatory Visit: Payer: No Typology Code available for payment source | Attending: Physical Medicine & Rehabilitation

## 2020-10-13 DIAGNOSIS — M25671 Stiffness of right ankle, not elsewhere classified: Secondary | ICD-10-CM | POA: Insufficient documentation

## 2020-10-13 DIAGNOSIS — M6281 Muscle weakness (generalized): Secondary | ICD-10-CM | POA: Insufficient documentation

## 2020-10-13 DIAGNOSIS — R2689 Other abnormalities of gait and mobility: Secondary | ICD-10-CM | POA: Diagnosis not present

## 2020-10-13 DIAGNOSIS — R600 Localized edema: Secondary | ICD-10-CM | POA: Insufficient documentation

## 2020-10-13 DIAGNOSIS — M25571 Pain in right ankle and joints of right foot: Secondary | ICD-10-CM | POA: Insufficient documentation

## 2020-10-13 NOTE — Therapy (Signed)
[facility] [address] [place], [place], [address] Phone: [phone]   Fax:  [phone]  Physical Therapy Treatment  Patient Details  Name: [name] MRN: [id number] Date of Birth: [birth year] Referring Provider (PT): Erick Colace, MD   Encounter Date: 10/13/2020   PT End of Session - 10/13/20 0816    Visit Number 11    Number of Visits 22    Date for PT Re-Evaluation 11/30/20    Authorization Type Pt is approved for 12 more visits    Authorization Time Period 12 visits (2x/week for 6 weeks)    Authorization - Visit Number 1   new auth period   PT Start Time 0820    PT Stop Time 0903    PT Time Calculation (min) 43 min    Activity Tolerance Patient tolerated treatment well    Behavior During Therapy [facility] for tasks assessed/performed           Past Medical History:  Diagnosis Date   Chiari malformation type II (HCC)    Family history of adverse reaction to anesthesia    grandfather died from anesthesia; grandfather had malignant hyperthermia   GERD (gastroesophageal reflux disease)    Hydrocephalus (HCC)    Malignant hyperthermia    Neuromuscular disorder (HCC)    Neuropathy   Osteoarthritis    PONV (postoperative nausea and vomiting)    Spina bifida [facility])     Past Surgical History:  Procedure Laterality Date   ANKLE ARTHROSCOPY Left    related to Tendon   ANKLE FUSION Right 12/23/2018   Procedure: right tibiotalar calcaneal arthrodesis;  Surgeon: Toni Arthurs, MD;  Location: [facility] OR;  Service: Orthopedics;  Laterality: Right;    ANKLE SURGERY Right 12/23/2018   BACK SURGERY  1971   Spinal Bifida   BACK SURGERY  2012   fusion   EXTERNAL FIXATION LEG Right 05/18/2018   Procedure: EXTERNAL FIXATION LEG;  Surgeon: Yolonda Kida, MD;  Location: [facility] OR;  Service: Orthopedics;  Laterality: Right;   EXTERNAL FIXATION REMOVAL Right 06/14/2018   Procedure: Right ankle  external fixator removal with steinman pin placement;  Surgeon: Yolonda Kida, MD;  Location: [facility] OR;  Service: Orthopedics;  Laterality: Right;  60 mins   HARDWARE REMOVAL Right 05/18/2018   Procedure: HARDWARE REMOVAL;  Surgeon: Yolonda Kida, MD;  Location: [facility] OR;  Service: Orthopedics;  Laterality: Right;   HARDWARE REMOVAL Right 12/23/2018   Procedure: RIGHT ANKLE REMOVAL OF DEEP IMPLANTS;  Surgeon: Toni Arthurs, MD;  Location: [facility] OR;  Service: Orthopedics;  Laterality: Right;   KNEE ARTHROSCOPY Left 1983   ORIF ANKLE FRACTURE Right 04/23/2018   Procedure: OPEN REDUCTION INTERNAL FIXATION (ORIF) TRIMALLEOLAR ANKLE FRACTURE;  Surgeon: Yolonda Kida, MD;  Location: [facility] ORS;  Service: Orthopedics;  Laterality: Right;  120 mins   ORIF ANKLE FRACTURE Right 05/18/2018   Procedure: Right ankle open reduction internal fixation revision;  Surgeon: Yolonda Kida, MD;  Location: [facility] OR;  Service: Orthopedics;  Laterality: Right;  120 mins   SHUNT EXTERNALIZATION     Head x 2   TIBIA OSTEOTOMY Right 12/23/2018   Procedure: RIGHT MEDIAL MALLEOLUS OSTEOTOMY;  Surgeon: Toni Arthurs, MD;  Location: [facility] OR;  Service: Orthopedics;  Laterality: Right;   WISDOM TOOTH EXTRACTION      There were no vitals filed for this visit.   Subjective Assessment - 10/13/20 0823    Subjective Pt reports her neurosurgeon increased the  pressure on her shunt. Pt reports she has been walking c a RW and c and s assistance for approx 10ftx2 at home.    Pertinent History Spina bifida of lumbosacral region with hydrocephalus, history of Chiari malformation, previous ankle surgeries    Limitations Standing;House hold activities;Walking    Patient Stated Goals Improve ability to walk and stand in household    Currently in Pain? Yes    Pain Score 5     Pain Location Ankle    Pain Orientation Right;Lateral    Pain Descriptors / Indicators Aching;Pressure    Pain Type Chronic pain    Pain Onset  More than a month ago    Pain Frequency Intermittent    Aggravating Factors  WBing    Pain Relieving Factors Rest    Effect of Pain on Daily Activities Limited general mobility                             [facility] Adult PT Treatment/Exercise - 10/13/20 0001      Ambulation/Gait   Ambulation/Gait Yes    Ambulation/Gait Assistance 5: Supervision    Ambulation Distance (Feet) 40 Feet   2nd walk 25 ft; 3rd walk 36ft. pt resed between all walks   Assistive device Rolling walker    Gait Pattern Poor foot clearance - right      Knee/Hip Exercises: Seated   Long Arc Quad Right;Left;10 reps    Long Arc Quad Limitations 2 sets    Marching Right;Left;2 sets;10 reps    Abduction/Adduction  10 reps;Right;Left;2 sets    Abd/Adduction Limitations ball      Ankle Exercises: Seated   ABC's 1 rep   forefoot   Towel Crunch 5 reps   2 sets   Towel Inversion/Eversion 5 reps   2 sets; forefoot   Heel Raises 10 reps   2 sets; forefoot   Toe Raise 10 reps   2 sets; forefoot                 PT Education - 10/13/20 0919    Education Details HEP-ankle/forefoot strengthening    Person(s) Educated Patient    Methods Explanation;Demonstration    Comprehension Verbalized understanding;Returned demonstration;Verbal cues required;Tactile cues required            PT Short Term Goals - 08/23/20 1939      PT SHORT TERM GOAL #2   Title Patient will report consistency with exercises and performance of frequent short walks at home. Achieved-  Walks are short and completed with A from husband    Status Achieved    Target Date 08/23/20             PT Long Term Goals - 08/23/20 1942      PT LONG TERM GOAL #1   Title Patient will be I with final HEP to maintain progress made from PT. On-going    Period Weeks    Status On-going    Target Date 09/08/20      PT LONG TERM GOAL #2   Title Patient will be able to ambulate >/= 40 ft with RW in order to improve household mobility.  On-going- 30 ft c RW and A for safety    Time 6    Period Weeks    Status On-going    Target Date 09/08/20      PT LONG TERM GOAL #3   Title Patient will be able >/= 5  minutes without support in order to be able to get herself something to eat or drink. On-going- Pt is completing standing activities at counter c rest break needed after each stand    Time 6    Period Weeks    Status New    Target Date 09/08/20                 Plan - 10/13/20 0920    Clinical Impression Statement PT was completed for LE srengthening c focus on the ankle/forefoot. Pt made a minimal increase in her walking distance. Per pt's report she is walking for exercise more at home.    Personal Factors and Comorbidities Fitness;Past/Current Experience;Time since onset of injury/illness/exacerbation;Transportation    Comorbidities Spina bifida of lumbosacral region with hydrocephalus, history of Chiari malformation, pevious ankle surgeries, BMI, chronic back pain    Examination-Activity Limitations Bathing;Locomotion Level;Transfers;Dressing;Hygiene/Grooming;Stand;Stairs;Squat;Sleep    Examination-Participation Restrictions Meal Prep;Cleaning;Occupation;Community Activity;Driving;Shop;Laundry;Yard Work    Conservation officer, historic buildings Evolving/Moderate complexity    Clinical Decision Making Moderate    Rehab Potential Good    PT Frequency 2x / week    PT Duration 12 weeks    PT Treatment/Interventions ADLs/Self Care Home Management;Cryotherapy;Electrical Stimulation;Iontophoresis 4mg /ml Dexamethasone;Moist Heat;Neuromuscular re-education;Balance training;Therapeutic exercise;Therapeutic activities;Functional mobility training;Stair training;Gait training;Patient/family education;Manual techniques;Passive range of motion;Dry needling;Joint Manipulations;Taping;Vasopneumatic Device    PT Next Visit Plan Progress ther ex/HEP and functional mobility aa tolerated    PT Home Exercise Plan [id number]: ankle pumps  (ankle AROM all directions), supine heel slide, supine marching, supine hip abduction/adduction, bridge, LAQ, seated marching, standing lateral weight shifts; forward/backward steps c RW, STS from reciner t/f RW.    Consulted and Agree with Plan of Care Patient           Patient will benefit from skilled therapeutic intervention in order to improve the following deficits and impairments:  Abnormal gait, Difficulty walking, Decreased activity tolerance, Pain, Decreased strength, Decreased mobility, Increased edema, Decreased endurance, Decreased balance  Visit Diagnosis: Other abnormalities of gait and mobility  Muscle weakness (generalized)  Pain in right ankle and joints of right foot  Stiffness of right ankle, not elsewhere classified  Localized edema     Problem List Patient Active Problem List   Diagnosis Date Noted   Post-traumatic arthritis of ankle, right 12/23/2018   Closed right ankle fracture 06/14/2018   Hypoalbuminemia due to protein-calorie malnutrition (HCC)    Acute blood loss anemia    Spina bifida of lumbosacral region with hydrocephalus [facility])    History of Chiari malformation    Trimalleolar fracture of ankle, closed, left, initial encounter 04/29/2018   Closed displaced trimalleolar fracture of right ankle 04/23/2018    04/25/2018 MS, PT 10/13/20 9:31 AM  [facility] [address] [place], [place], [place] Phone: [phone]   Fax:  [phone]  Name: [name] MRN: [id number] Frees Date of Birth: 01-15-1971

## 2020-10-19 ENCOUNTER — Encounter: Payer: Self-pay | Admitting: Physical Medicine & Rehabilitation

## 2020-10-19 ENCOUNTER — Encounter
Payer: No Typology Code available for payment source | Attending: Physical Medicine & Rehabilitation | Admitting: Physical Medicine & Rehabilitation

## 2020-10-19 ENCOUNTER — Other Ambulatory Visit: Payer: Self-pay

## 2020-10-19 VITALS — BP 145/84 | HR 96 | Temp 98.1°F | Ht 60.0 in | Wt 290.0 lb

## 2020-10-19 DIAGNOSIS — M19171 Post-traumatic osteoarthritis, right ankle and foot: Secondary | ICD-10-CM | POA: Insufficient documentation

## 2020-10-19 DIAGNOSIS — M797 Fibromyalgia: Secondary | ICD-10-CM | POA: Insufficient documentation

## 2020-10-19 DIAGNOSIS — S82851S Displaced trimalleolar fracture of right lower leg, sequela: Secondary | ICD-10-CM | POA: Diagnosis present

## 2020-10-19 NOTE — Patient Instructions (Signed)
Please bring walker to next visit 

## 2020-10-19 NOTE — Progress Notes (Signed)
Subjective:    Patient ID: [name], female    DOB: 23-Apr-1971, 49 y.o.   MRN: [id number] female with history of spina bifida and type II Chiari malformation with who is referred by Dr. Victorino DikeHewitt for right ankle and foot pain.  The patient had a work related injury when she was employed at [employer] on 04/12/2018.  The patient  presented to the [facility] department. She underwent closed reduction of the fracture dislocation. The patient followed up with Dr. Duwayne HeckJason Rogers and underwent right ankle open reduction internal fixation of a trimalleolar fracture. The patient was seen at: Inpatient rehab unit, Dr. Ivory BroadZach Swartz was the attending physician.  The patient was nonweightbearing.  On admission to rehab required mod assist stand pivot transfers mod to max with ADLs.  Upon discharge from rehab the patient required supervision for car transfers with sliding board.  Modified independent upper extremity ADLs but did require assistance for lower body ADLs. Has had follow up with ortho foot and ankle Dr Victorino DikeHewitt fusion is stable  Pt received home health PT after CIR admission.  The patient and husband state that she has not been able to access the bathroom with her scooter.  Rehab discharge summary indicated that she could access the bathroom but it did indicate whether this was with a wheelchair. On 05/18/2018 patient had hardware removal due to loss of fixation with placement of external fixator. On 06/15/2019 was removal of external fixator with placement of Steinmann pin On 12/27/2018 patient underwent right ankle tibiotalar calcaneal arthrodesis by Dr. Victorino DikeHewitt. Numbness in RIght lower ext, swelling, problems with toilet and bed transfer.  Feels numbness came on after the external fixator placement   HPI Patient diagnosed by neurology with fibromyalgia this morning.  Encouraged to remove.  Patient still very sedentary spends most of the day in a recliner. She has been going to outpatient  physical therapy and at first was only able to ambulate 6 feet is now able to ambulate 30 feet with a walker.  Despite this she does not ambulate much in the home.  We discussed trying to move more and how this may help with her overall pain complaints.  She complains of pain in both shoulders elbows right greater than left thigh and knees. She also has pain in the right greater than left ankle.  Pain Inventory Average Pain 4 Pain Right Now 5 My pain is burning, dull, stabbing and aching  In the last 24 hours, has pain interfered with the following? General activity 6 Relation with others 2 Enjoyment of life 6 What TIME of day is your pain at its worst? evening and night Sleep (in general) Fair  Pain is worse with: walking and standing Pain improves with: medication Relief from Meds: na  Family History  Problem Relation Age of Onset  . Non-Hodgkin's lymphoma Mother   . Hypertension Mother   . Hypertension Father    Social History   Socioeconomic History  . Marital status: Married    Spouse name: Not on file  . Number of children: Not on file  . Years of education: Not on file  . Highest education level: Not on file  Occupational History  . Not on file  Tobacco Use  . Smoking status: Never Smoker  . Smokeless tobacco: Never Used  Vaping Use  . Vaping Use: Never used  Substance and Sexual Activity  . Alcohol use: Never  . Drug use: Never  . Sexual activity: Not on  file  Other Topics Concern  . Not on file  Social History Narrative  . Not on file   Social Determinants of Health   Financial Resource Strain:   . Difficulty of Paying Living Expenses: Not on file  Food Insecurity:   . Worried About Programme researcher, broadcasting/film/video in the Last Year: Not on file  . Ran Out of Food in the Last Year: Not on file  Transportation Needs:   . Lack of Transportation (Medical): Not on file  . Lack of Transportation (Non-Medical): Not on file  Physical Activity:   . Days of Exercise per  Week: Not on file  . Minutes of Exercise per Session: Not on file  Stress:   . Feeling of Stress : Not on file  Social Connections:   . Frequency of Communication with Friends and Family: Not on file  . Frequency of Social Gatherings with Friends and Family: Not on file  . Attends Religious Services: Not on file  . Active Member of Clubs or Organizations: Not on file  . Attends Banker Meetings: Not on file  . Marital Status: Not on file   Past Surgical History:  Procedure Laterality Date  . ANKLE ARTHROSCOPY Left    related to Tendon  . ANKLE FUSION Right 12/23/2018   Procedure: right tibiotalar calcaneal arthrodesis;  Surgeon: Toni Arthurs, MD;  Location: [facility] OR;  Service: Orthopedics;  Laterality: Right;   . ANKLE SURGERY Right 12/23/2018  . BACK SURGERY  1971   Spinal Bifida  . BACK SURGERY  2012   fusion  . EXTERNAL FIXATION LEG Right 05/18/2018   Procedure: EXTERNAL FIXATION LEG;  Surgeon: Yolonda Kida, MD;  Location: [facility] OR;  Service: Orthopedics;  Laterality: Right;  . EXTERNAL FIXATION REMOVAL Right 06/14/2018   Procedure: Right ankle external fixator removal with steinman pin placement;  Surgeon: Yolonda Kida, MD;  Location: [facility] OR;  Service: Orthopedics;  Laterality: Right;  60 mins  . HARDWARE REMOVAL Right 05/18/2018   Procedure: HARDWARE REMOVAL;  Surgeon: Yolonda Kida, MD;  Location: [facility] OR;  Service: Orthopedics;  Laterality: Right;  . HARDWARE REMOVAL Right 12/23/2018   Procedure: RIGHT ANKLE REMOVAL OF DEEP IMPLANTS;  Surgeon: Toni Arthurs, MD;  Location: [facility] OR;  Service: Orthopedics;  Laterality: Right;  . KNEE ARTHROSCOPY Left 1983  . ORIF ANKLE FRACTURE Right 04/23/2018   Procedure: OPEN REDUCTION INTERNAL FIXATION (ORIF) TRIMALLEOLAR ANKLE FRACTURE;  Surgeon: Yolonda Kida, MD;  Location: [facility];  Service: Orthopedics;  Laterality: Right;  120 mins  . ORIF ANKLE FRACTURE Right 05/18/2018   Procedure: Right ankle open  reduction internal fixation revision;  Surgeon: Yolonda Kida, MD;  Location: [facility] OR;  Service: Orthopedics;  Laterality: Right;  120 mins  . SHUNT EXTERNALIZATION     Head x 2  . TIBIA OSTEOTOMY Right 12/23/2018   Procedure: RIGHT MEDIAL MALLEOLUS OSTEOTOMY;  Surgeon: Toni Arthurs, MD;  Location: [facility] OR;  Service: Orthopedics;  Laterality: Right;  . WISDOM TOOTH EXTRACTION     Past Surgical History:  Procedure Laterality Date  . ANKLE ARTHROSCOPY Left    related to Tendon  . ANKLE FUSION Right 12/23/2018   Procedure: right tibiotalar calcaneal arthrodesis;  Surgeon: Toni Arthurs, MD;  Location: [facility] OR;  Service: Orthopedics;  Laterality: Right;   . ANKLE SURGERY Right 12/23/2018  . BACK SURGERY  1971   Spinal Bifida  . BACK SURGERY  2012   fusion  . EXTERNAL  FIXATION LEG Right 05/18/2018   Procedure: EXTERNAL FIXATION LEG;  Surgeon: Yolonda Kida, MD;  Location: [facility] OR;  Service: Orthopedics;  Laterality: Right;  . EXTERNAL FIXATION REMOVAL Right 06/14/2018   Procedure: Right ankle external fixator removal with steinman pin placement;  Surgeon: Yolonda Kida, MD;  Location: [facility] OR;  Service: Orthopedics;  Laterality: Right;  60 mins  . HARDWARE REMOVAL Right 05/18/2018   Procedure: HARDWARE REMOVAL;  Surgeon: Yolonda Kida, MD;  Location: [facility] OR;  Service: Orthopedics;  Laterality: Right;  . HARDWARE REMOVAL Right 12/23/2018   Procedure: RIGHT ANKLE REMOVAL OF DEEP IMPLANTS;  Surgeon: Toni Arthurs, MD;  Location: [facility] OR;  Service: Orthopedics;  Laterality: Right;  . KNEE ARTHROSCOPY Left 1983  . ORIF ANKLE FRACTURE Right 04/23/2018   Procedure: OPEN REDUCTION INTERNAL FIXATION (ORIF) TRIMALLEOLAR ANKLE FRACTURE;  Surgeon: Yolonda Kida, MD;  [facility];  Service: Orthopedics;  Laterality: Right;  120 mins  . ORIF ANKLE FRACTURE Right 05/18/2018   Procedure: Right ankle open reduction internal fixation revision;  Surgeon: Yolonda Kida, MD;   Location: [facility] OR;  Service: Orthopedics;  Laterality: Right;  120 mins  . SHUNT EXTERNALIZATION     Head x 2  . TIBIA OSTEOTOMY Right 12/23/2018   Procedure: RIGHT MEDIAL MALLEOLUS OSTEOTOMY;  Surgeon: Toni Arthurs, MD;  Location: [facility] OR;  Service: Orthopedics;  Laterality: Right;  . WISDOM TOOTH EXTRACTION     Past Medical History:  Diagnosis Date  . Chiari malformation type II (HCC)   . Family history of adverse reaction to anesthesia    grandfather died from anesthesia; grandfather had malignant hyperthermia  . GERD (gastroesophageal reflux disease)   . Hydrocephalus (HCC)   . Malignant hyperthermia   . Neuromuscular disorder (HCC)    Neuropathy  . Osteoarthritis   . PONV (postoperative nausea and vomiting)   . Spina bifida (HCC)    BP (!) 145/84   Pulse 96   Temp 98.1 F (36.7 C)   Ht 5' (1.524 m)   Wt 290 lb (131.5 kg)   SpO2 98%   BMI 56.64 kg/m   Opioid Risk Score:   Fall Risk Score:  `1  Depression screen PHQ 2/9  Depression screen [facility] 2/9 10/19/2020 09/07/2020 04/03/2020  Decreased Interest 1 1 2   Down, Depressed, Hopeless 1 1 2   PHQ - 2 Score 2 2 4   Altered sleeping - - 2  Tired, decreased energy - - 2  Change in appetite - - 2  Feeling bad or failure about yourself  - - 2  Trouble concentrating - - 2  Moving slowly or fidgety/restless - - 0  Suicidal thoughts - - 0  PHQ-9 Score - - 14  Difficult doing work/chores - - Extremely dIfficult    Review of Systems  Constitutional: Negative.   HENT: Negative.   Eyes: Negative.   Respiratory: Negative.   Cardiovascular: Negative.   Gastrointestinal: Negative.   Endocrine: Negative.   Genitourinary: Negative.   Musculoskeletal: Positive for arthralgias, gait problem and myalgias.  Skin: Negative.   Allergic/Immunologic: Negative.   Neurological:       Burning  Hematological: Negative.   Psychiatric/Behavioral: Negative.   All other systems reviewed and are negative.      Objective:   Physical  Exam Constitutional:      Appearance: She is obese.  HENT:     Head: Normocephalic and atraumatic.  Eyes:     Extraocular Movements: Extraocular movements intact.  Conjunctiva/sclera: Conjunctivae normal.     Pupils: Pupils are equal, round, and reactive to light.  Musculoskeletal:        General: Tenderness present.     Comments: Tenderness palpation at the left infraspinatus region.  Tenderness at the lateral elbows there is a tender area anterior thigh on the right side. Motor strength is 5/5 bilateral deltoid bicep tricep grip 3 - bilateral hip flexors 4 at bilateral knee extensors right ankle limited by range of motion, left ankle 4/5 strength.  Neurological:     Mental Status: She is alert and oriented to person, place, and time.  Psychiatric:        Mood and Affect: Mood normal.        Behavior: Behavior normal.           Assessment & Plan:  #1.  History of trimalleolar fracture with subsequent decline of functioning.  Patient has a long history of balance problems as well as problems with headaches related to her Chiari malformation.  In addition she has been diagnosed with fibromyalgia syndrome.  We discussed the role of exercise in her overall recovery.  She is making some progress in therapy but does not seem to be caring this over at home.  I also encouraged the patient to get 1 or 2 pound weights to do bicep curls and some overhead shoulder presses she feels very weak in the upper extremities.  She is not working on this in physical therapy. Return to clinic in 2 months

## 2020-11-01 ENCOUNTER — Other Ambulatory Visit: Payer: Self-pay | Admitting: Neurological Surgery

## 2020-11-01 ENCOUNTER — Other Ambulatory Visit (HOSPITAL_COMMUNITY): Payer: Self-pay | Admitting: Neurological Surgery

## 2020-11-01 DIAGNOSIS — Z4541 Encounter for adjustment and management of cerebrospinal fluid drainage device: Secondary | ICD-10-CM

## 2020-11-08 ENCOUNTER — Telehealth: Payer: Self-pay

## 2020-11-08 NOTE — Telephone Encounter (Signed)
Andris Flurry the Nurse Care Manager for [name] called to request a written order for the CicAid Compression wrap. It must state it is for the right leg. The device is discussed in your 06/08/2020 office note.    If granted I have the script for [facility] at the nursing station for signing.   Thank you.

## 2020-11-13 ENCOUNTER — Ambulatory Visit: Payer: No Typology Code available for payment source | Attending: Physical Medicine & Rehabilitation

## 2020-11-13 ENCOUNTER — Other Ambulatory Visit: Payer: Self-pay

## 2020-11-13 DIAGNOSIS — R600 Localized edema: Secondary | ICD-10-CM | POA: Diagnosis present

## 2020-11-13 DIAGNOSIS — R2689 Other abnormalities of gait and mobility: Secondary | ICD-10-CM | POA: Diagnosis present

## 2020-11-13 DIAGNOSIS — M25671 Stiffness of right ankle, not elsewhere classified: Secondary | ICD-10-CM | POA: Insufficient documentation

## 2020-11-13 DIAGNOSIS — M25571 Pain in right ankle and joints of right foot: Secondary | ICD-10-CM | POA: Diagnosis present

## 2020-11-13 DIAGNOSIS — M6281 Muscle weakness (generalized): Secondary | ICD-10-CM | POA: Insufficient documentation

## 2020-11-13 NOTE — Therapy (Signed)
[facility] Outpatient Rehabilitation [facility] [address] [place], [place], [address] Phone: [phone]   Fax:  [phone]  Physical Therapy Treatment  Patient Details  Name: [name] MRN: [id number] Date of Birth: [birth year] Referring Provider (PT): Wynn Banker Victorino Sparrow, MD   Encounter Date: 11/13/2020   PT End of Session - 11/13/20 1842    Visit Number 12    Number of Visits 22    Date for PT Re-Evaluation 11/30/20    Authorization Type Pt is approved for 12 more visits    Authorization Time Period 12 visits (2x/week for 6 weeks)    Authorization - Visit Number 2    Authorization - Number of Visits 12    PT Start Time 1835    PT Stop Time 1920    PT Time Calculation (min) 45 min    Activity Tolerance Patient tolerated treatment well    Behavior During Therapy [facility] for tasks assessed/performed           Past Medical History:  Diagnosis Date  . Chiari malformation type II (HCC)   . Family history of adverse reaction to anesthesia    grandfather died from anesthesia; grandfather had malignant hyperthermia  . GERD (gastroesophageal reflux disease)   . Hydrocephalus (HCC)   . Malignant hyperthermia   . Neuromuscular disorder (HCC)    Neuropathy  . Osteoarthritis   . PONV (postoperative nausea and vomiting)   . Spina bifida [facility])     Past Surgical History:  Procedure Laterality Date  . ANKLE ARTHROSCOPY Left    related to Tendon  . ANKLE FUSION Right 12/23/2018   Procedure: right tibiotalar calcaneal arthrodesis;  Surgeon: Toni Arthurs, MD;  Location: [facility] OR;  Service: Orthopedics;  Laterality: Right;   . ANKLE SURGERY Right 12/23/2018  . BACK SURGERY  1971   Spinal Bifida  . BACK SURGERY  2012   fusion  . EXTERNAL FIXATION LEG Right 05/18/2018   Procedure: EXTERNAL FIXATION LEG;  Surgeon: Yolonda Kida, MD;  Location: [facility] OR;  Service: Orthopedics;  Laterality: Right;  . EXTERNAL FIXATION REMOVAL Right 06/14/2018    Procedure: Right ankle external fixator removal with steinman pin placement;  Surgeon: Yolonda Kida, MD;  Location: [facility] OR;  Service: Orthopedics;  Laterality: Right;  60 mins  . HARDWARE REMOVAL Right 05/18/2018   Procedure: HARDWARE REMOVAL;  Surgeon: Yolonda Kida, MD;  Location: [facility] OR;  Service: Orthopedics;  Laterality: Right;  . HARDWARE REMOVAL Right 12/23/2018   Procedure: RIGHT ANKLE REMOVAL OF DEEP IMPLANTS;  Surgeon: Toni Arthurs, MD;  Location: [facility] OR;  Service: Orthopedics;  Laterality: Right;  . KNEE ARTHROSCOPY Left 1983  . ORIF ANKLE FRACTURE Right 04/23/2018   Procedure: OPEN REDUCTION INTERNAL FIXATION (ORIF) TRIMALLEOLAR ANKLE FRACTURE;  Surgeon: Yolonda Kida, MD;  Location: [facility] ORS;  Service: Orthopedics;  Laterality: Right;  120 mins  . ORIF ANKLE FRACTURE Right 05/18/2018   Procedure: Right ankle open reduction internal fixation revision;  Surgeon: Yolonda Kida, MD;  Location: [facility] OR;  Service: Orthopedics;  Laterality: Right;  120 mins  . SHUNT EXTERNALIZATION     Head x 2  . TIBIA OSTEOTOMY Right 12/23/2018   Procedure: RIGHT MEDIAL MALLEOLUS OSTEOTOMY;  Surgeon: Toni Arthurs, MD;  Location: [facility] OR;  Service: Orthopedics;  Laterality: Right;  . WISDOM TOOTH EXTRACTION      There were no vitals filed for this visit.   Subjective Assessment - 11/13/20 1838    Subjective Pt  reports her L ankle swelling is an issue and she needs to elevate it often. Pt reports her walking in her home is similar about 48ft at a time.    Currently in Pain? Yes    Pain Score 5     Pain Location Ankle    Pain Orientation Right;Lateral    Pain Descriptors / Indicators Aching;Pressure    Pain Type Chronic pain    Pain Onset More than a month ago    Pain Frequency Intermittent    Aggravating Factors  WBing    Pain Relieving Factors Rest    Effect of Pain on Daily Activities Decreased mobility                             [facility] Adult PT  Treatment/Exercise - 11/13/20 0001      Ambulation/Gait   Ambulation/Gait Yes    Ambulation/Gait Assistance 5: Supervision    Ambulation Distance (Feet) 40 Feet   29ft x 2 c a rest break   Assistive device Rolling walker    Gait Pattern Poor foot clearance - right    Gait Comments Walking distance was limited by LE fatigue, R > L      Knee/Hip Exercises: Standing   Hip Flexion Right;Left;10 reps;Knee bent    Hip Flexion Limitations decreased clearance R>L      Knee/Hip Exercises: Seated   Long Arc Quad Right;Left;15 reps    Abduction/Adduction  10 reps;Right;Left;2 sets    Abd/Adduction Limitations ball    Sit to Sand 10 reps;with UE support      Knee/Hip Exercises: Supine   Heel Slides Left;10 reps    Straight Leg Raises Right;10 reps    Straight Leg Raises Limitations just clears the mat table    Other Supine Knee/Hip Exercises R hip abduction, 10x      Ankle Exercises: Seated   ABC's 1 rep   forefoot   Towel Crunch 5 reps   2 sets   Towel Inversion/Eversion 5 reps   2 sets; forefoot   Heel Raises 10 reps   2 sets; forefoot motion   Toe Raise 10 reps   2 sets; forefoot motion                 PT Education - 11/13/20 2229    Education Details reviewed HEP    Person(s) Educated Patient    Methods Explanation;Demonstration;Tactile cues;Verbal cues    Comprehension Verbalized understanding;Returned demonstration;Verbal cues required;Tactile cues required;Need further instruction            PT Short Term Goals - 08/23/20 1939      PT SHORT TERM GOAL #2   Title Patient will report consistency with exercises and performance of frequent short walks at home. Achieved-  Walks are short and completed with A from husband    Status Achieved    Target Date 08/23/20             PT Long Term Goals - 08/23/20 1942      PT LONG TERM GOAL #1   Title Patient will be I with final HEP to maintain progress made from PT. On-going    Period Weeks    Status On-going     Target Date 09/08/20      PT LONG TERM GOAL #2   Title Patient will be able to ambulate >/= 40 ft with RW in order to improve household mobility. On-going- 30 ft c RW  and A for safety    Time 6    Period Weeks    Status On-going    Target Date 09/08/20      PT LONG TERM GOAL #3   Title Patient will be able >/= 5 minutes without support in order to be able to get herself something to eat or drink. On-going- Pt is completing standing activities at counter c rest break needed after each stand    Time 6    Period Weeks    Status New    Target Date 09/08/20                 Plan - 11/13/20 2231    Clinical Impression Statement PT was completed for LE strengthening, hips, knees and ankles, and gait training. Walking tolerance is similar to the last PT session. Pt indicates she is walking for exercise ay home daily. Use of the R LE is impacted by it's long standing swelling. Encourged pt to complete strengthening of the LEs every other day.    Personal Factors and Comorbidities Fitness;Past/Current Experience;Time since onset of injury/illness/exacerbation;Transportation    Comorbidities Spina bifida of lumbosacral region with hydrocephalus, history of Chiari malformation, pevious ankle surgeries, BMI, chronic back pain    Examination-Activity Limitations Bathing;Locomotion Level;Transfers;Dressing;Hygiene/Grooming;Stand;Stairs;Squat;Sleep    Examination-Participation Restrictions Meal Prep;Cleaning;Occupation;Community Activity;Driving;Shop;Laundry;Yard Work    Conservation officer, historic buildings Evolving/Moderate complexity    Clinical Decision Making Moderate    Rehab Potential Good    PT Frequency 2x / week    PT Duration 12 weeks    PT Treatment/Interventions ADLs/Self Care Home Management;Cryotherapy;Electrical Stimulation;Iontophoresis 4mg /ml Dexamethasone;Moist Heat;Neuromuscular re-education;Balance training;Therapeutic exercise;Therapeutic activities;Functional mobility  training;Stair training;Gait training;Patient/family education;Manual techniques;Passive range of motion;Dry needling;Joint Manipulations;Taping;Vasopneumatic Device    PT Next Visit Plan Progress ther ex/HEP and functional mobility aa tolerated    PT Home Exercise Plan [id number]: ankle pumps (ankle AROM all directions), supine heel slide, supine marching, supine hip abduction/adduction, bridge, LAQ, seated marching, standing lateral weight shifts; forward/backward steps c RW, STS from reciner t/f RW.    Consulted and Agree with Plan of Care Patient           Patient will benefit from skilled therapeutic intervention in order to improve the following deficits and impairments:  Abnormal gait,Difficulty walking,Decreased activity tolerance,Pain,Decreased strength,Decreased mobility,Increased edema,Decreased endurance,Decreased balance  Visit Diagnosis: Other abnormalities of gait and mobility  Muscle weakness (generalized)  Pain in right ankle and joints of right foot  Localized edema  Stiffness of right ankle, not elsewhere classified     Problem List Patient Active Problem List   Diagnosis Date Noted  . Post-traumatic arthritis of ankle, right 12/23/2018  . Closed right ankle fracture 06/14/2018  . Hypoalbuminemia due to protein-calorie malnutrition (HCC)   . Acute blood loss anemia   . 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 History of Chiari malformation   . Trimalleolar fracture of ankle, closed, left, initial encounter 04/29/2018  . Closed displaced trimalleolar fracture of right ankle 04/23/2018    04/25/2018 MS, PT 11/13/20 10:42 PM  [facility] Outpatient Rehabilitation [facility] [address] [place], [place], [place] Phone: [phone]   Fax:  [phone]  Name: [name] MRN: Shirline Frees Date of Birth: 1971/04/29

## 2020-11-14 ENCOUNTER — Telehealth: Payer: Self-pay | Admitting: *Deleted

## 2020-11-14 NOTE — Telephone Encounter (Signed)
Message left by Andris Flurry NCM for [facility] requesting a call back about [name].  I see she called about getting an order for a device from Dr Wynn Banker previously on 11/08/20.  I have called her back and left a message that he is out of the office until 11/27/20. If she needs further assistance she should call back.

## 2020-11-15 ENCOUNTER — Ambulatory Visit (HOSPITAL_COMMUNITY)
Admission: RE | Admit: 2020-11-15 | Discharge: 2020-11-15 | Disposition: A | Discharge status: Home / Self Care | Payer: Medicare Other | Source: Ambulatory Visit | Attending: Neurological Surgery | Admitting: Neurological Surgery

## 2020-11-15 ENCOUNTER — Other Ambulatory Visit: Payer: Self-pay

## 2020-11-15 ENCOUNTER — Ambulatory Visit: Payer: No Typology Code available for payment source | Attending: Physical Medicine & Rehabilitation

## 2020-11-15 DIAGNOSIS — R600 Localized edema: Secondary | ICD-10-CM | POA: Insufficient documentation

## 2020-11-15 DIAGNOSIS — M6281 Muscle weakness (generalized): Secondary | ICD-10-CM | POA: Insufficient documentation

## 2020-11-15 DIAGNOSIS — M25671 Stiffness of right ankle, not elsewhere classified: Secondary | ICD-10-CM | POA: Insufficient documentation

## 2020-11-15 DIAGNOSIS — R2689 Other abnormalities of gait and mobility: Secondary | ICD-10-CM | POA: Diagnosis not present

## 2020-11-15 DIAGNOSIS — Z4541 Encounter for adjustment and management of cerebrospinal fluid drainage device: Secondary | ICD-10-CM | POA: Diagnosis not present

## 2020-11-15 DIAGNOSIS — M25571 Pain in right ankle and joints of right foot: Secondary | ICD-10-CM | POA: Diagnosis present

## 2020-11-15 NOTE — Therapy (Signed)
[facility] Outpatient Rehabilitation [facility] [address] [place], [place], [address] Phone: [phone]   Fax:  [phone]  Physical Therapy Treatment  Patient Details  Name: [name] MRN: [id number] Date of Birth: 1971-05-06 Referring Provider (PT): Wynn Banker Victorino Sparrow, MD   Encounter Date: 11/15/2020   PT End of Session - 11/15/20 1857    Visit Number 13    Number of Visits 22    Date for PT Re-Evaluation 11/30/20    Authorization Type Pt is approved for 12 more visits    Authorization Time Period 12 visits (2x/week for 6 weeks)    Authorization - Number of Visits 12    PT Start Time 1833    PT Stop Time 1915    PT Time Calculation (min) 42 min    Equipment Utilized During Treatment Other (comment)   scooter for mobility to/from and during PT session   Activity Tolerance Patient tolerated treatment well    Behavior During Therapy [facility] for tasks assessed/performed           Past Medical History:  Diagnosis Date  . Chiari malformation type II (HCC)   . Family history of adverse reaction to anesthesia    grandfather died from anesthesia; grandfather had malignant hyperthermia  . GERD (gastroesophageal reflux disease)   . Hydrocephalus (HCC)   . Malignant hyperthermia   . Neuromuscular disorder (HCC)    Neuropathy  . Osteoarthritis   . PONV (postoperative nausea and vomiting)   . Spina bifida [facility])     Past Surgical History:  Procedure Laterality Date  . ANKLE ARTHROSCOPY Left    related to Tendon  . ANKLE FUSION Right 12/23/2018   Procedure: right tibiotalar calcaneal arthrodesis;  Surgeon: Toni Arthurs, MD;  Location: [facility] OR;  Service: Orthopedics;  Laterality: Right;   . ANKLE SURGERY Right 12/23/2018  . BACK SURGERY  1971   Spinal Bifida  . BACK SURGERY  2012   fusion  . EXTERNAL FIXATION LEG Right 05/18/2018   Procedure: EXTERNAL FIXATION LEG;  Surgeon: Yolonda Kida, MD;  Location: [facility] OR;  Service: Orthopedics;   Laterality: Right;  . EXTERNAL FIXATION REMOVAL Right 06/14/2018   Procedure: Right ankle external fixator removal with steinman pin placement;  Surgeon: Yolonda Kida, MD;  Location: [facility] OR;  Service: Orthopedics;  Laterality: Right;  60 mins  . HARDWARE REMOVAL Right 05/18/2018   Procedure: HARDWARE REMOVAL;  Surgeon: Yolonda Kida, MD;  Location: [facility] OR;  Service: Orthopedics;  Laterality: Right;  . HARDWARE REMOVAL Right 12/23/2018   Procedure: RIGHT ANKLE REMOVAL OF DEEP IMPLANTS;  Surgeon: Toni Arthurs, MD;  Location: [facility] OR;  Service: Orthopedics;  Laterality: Right;  . KNEE ARTHROSCOPY Left 1983  . ORIF ANKLE FRACTURE Right 04/23/2018   Procedure: OPEN REDUCTION INTERNAL FIXATION (ORIF) TRIMALLEOLAR ANKLE FRACTURE;  Surgeon: Yolonda Kida, MD;  Location: [facility] ORS;  Service: Orthopedics;  Laterality: Right;  120 mins  . ORIF ANKLE FRACTURE Right 05/18/2018   Procedure: Right ankle open reduction internal fixation revision;  Surgeon: Yolonda Kida, MD;  Location: [facility] OR;  Service: Orthopedics;  Laterality: Right;  120 mins  . SHUNT EXTERNALIZATION     Head x 2  . TIBIA OSTEOTOMY Right 12/23/2018   Procedure: RIGHT MEDIAL MALLEOLUS OSTEOTOMY;  Surgeon: Toni Arthurs, MD;  Location: [facility] OR;  Service: Orthopedics;  Laterality: Right;  . WISDOM TOOTH EXTRACTION      There were no vitals filed for this visit.  Subjective Assessment - 11/15/20 1852    Subjective Pt reports after lying down with exs during the last PT session and lying down for a CT scan today, her low back is hurting Pt rates a 8/10.    Pertinent History Spina bifida of lumbosacral region with hydrocephalus, history of Chiari malformation, previous ankle surgeries    Patient Stated Goals Improve ability to walk and stand in household    Currently in Pain? Yes    Pain Score 4     Pain Location Ankle    Pain Orientation Right    Pain Descriptors / Indicators Aching;Pressure    Pain Type Chronic pain     Pain Onset More than a month ago    Pain Frequency Intermittent    Aggravating Factors  WBing                             [facility] Adult PT Treatment/Exercise - 11/15/20 0001      Ambulation/Gait   Ambulation/Gait Yes    Ambulation/Gait Assistance 5: Supervision    Ambulation Distance (Feet) 40 Feet   54ft x 2 c a rest break   Assistive device Rolling walker    Gait Pattern Poor foot clearance - right      Knee/Hip Exercises: Seated   Long Arc Quad Right;Left;2 sets;10 reps    Long Arc Quad Weight 4 lbs.    Marching Right;Left;2 sets;10 reps    Marching Limitations 4 lb    Abduction/Adduction  10 reps;Right;Left;2 sets    Abd/Adduction Limitations ball for add; 4 lbs for abd    Sit to Sand 10 reps;with UE support      Ankle Exercises: Seated   ABC's 1 rep   forefoot   Towel Crunch 5 reps   2 sets   Towel Inversion/Eversion 5 reps   2 sets; forefoot   Heel Raises 10 reps   2 sets; forefoot motion   Toe Raise 10 reps   2 sets; forefoot motion                   PT Short Term Goals - 08/23/20 1939      PT SHORT TERM GOAL #2   Title Patient will report consistency with exercises and performance of frequent short walks at home. Achieved-  Walks are short and completed with A from husband    Status Achieved    Target Date 08/23/20             PT Long Term Goals - 08/23/20 1942      PT LONG TERM GOAL #1   Title Patient will be I with final HEP to maintain progress made from PT. On-going    Period Weeks    Status On-going    Target Date 09/08/20      PT LONG TERM GOAL #2   Title Patient will be able to ambulate >/= 40 ft with RW in order to improve household mobility. On-going- 30 ft c RW and A for safety    Time 6    Period Weeks    Status On-going    Target Date 09/08/20      PT LONG TERM GOAL #3   Title Patient will be able >/= 5 minutes without support in order to be able to get herself something to eat or drink. On-going- Pt is  completing standing activities at counter c rest break needed after each stand  Time 6    Period Weeks    Status New    Target Date 09/08/20                 Plan - 11/15/20 1915    Clinical Impression Statement PT was provided to address stengthening of the LEs. Pt tolerated the progression of thr ther ex using cuff weight resistance. Ambulation is limited to attempts of 44ft. pt experience and increase with low back pain after completing ther ex supine.    Personal Factors and Comorbidities Fitness;Past/Current Experience;Time since onset of injury/illness/exacerbation;Transportation    Comorbidities Spina bifida of lumbosacral region with hydrocephalus, history of Chiari malformation, pevious ankle surgeries, BMI, chronic back pain    Examination-Activity Limitations Bathing;Locomotion Level;Transfers;Dressing;Hygiene/Grooming;Stand;Stairs;Squat;Sleep    Examination-Participation Restrictions Meal Prep;Cleaning;Occupation;Community Activity;Driving;Shop;Laundry;Yard Work    Conservation officer, historic buildings Evolving/Moderate complexity    Clinical Decision Making Moderate    Rehab Potential Good    PT Frequency 2x / week    PT Duration 12 weeks    PT Treatment/Interventions ADLs/Self Care Home Management;Cryotherapy;Electrical Stimulation;Iontophoresis 4mg /ml Dexamethasone;Moist Heat;Neuromuscular re-education;Balance training;Therapeutic exercise;Therapeutic activities;Functional mobility training;Stair training;Gait training;Patient/family education;Manual techniques;Passive range of motion;Dry needling;Joint Manipulations;Taping;Vasopneumatic Device    PT Next Visit Plan Progress ther ex/HEP and functional mobility aa tolerated    PT Home Exercise Plan [id number]: ankle pumps (ankle AROM all directions), supine heel slide, supine marching, supine hip abduction/adduction, bridge, LAQ, seated marching, standing lateral weight shifts; forward/backward steps c RW, STS from reciner t/f  RW.    Consulted and Agree with Plan of Care Patient           Patient will benefit from skilled therapeutic intervention in order to improve the following deficits and impairments:  Abnormal gait,Difficulty walking,Decreased activity tolerance,Pain,Decreased strength,Decreased mobility,Increased edema,Decreased endurance,Decreased balance  Visit Diagnosis: Other abnormalities of gait and mobility  Muscle weakness (generalized)  Pain in right ankle and joints of right foot  Localized edema  Stiffness of right ankle, not elsewhere classified     Problem List Patient Active Problem List   Diagnosis Date Noted  . Post-traumatic arthritis of ankle, right 12/23/2018  . Closed right ankle fracture 06/14/2018  . Hypoalbuminemia due to protein-calorie malnutrition (HCC)   . Acute blood loss anemia   . 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 History of Chiari malformation   . Trimalleolar fracture of ankle, closed, left, initial encounter 04/29/2018  . Closed displaced trimalleolar fracture of right ankle 04/23/2018    04/25/2018 MS, PT 11/15/20 10:27 PM  [facility] Health Outpatient Rehabilitation [facility] [address] [place], [place], [place] Phone: [phone]   Fax:  [phone]  Name: [name] MRN: Shirline Frees Date of Birth: [birth year]

## 2020-11-15 NOTE — Telephone Encounter (Signed)
[name] was asking [place] to get [name] scheduled with PT, they are having problems getting an appt.  [name] is reaching out to [facility].

## 2020-11-27 ENCOUNTER — Ambulatory Visit: Payer: Medicare Other

## 2020-11-29 ENCOUNTER — Ambulatory Visit: Payer: Medicare Other

## 2020-11-30 ENCOUNTER — Emergency Department (HOSPITAL_COMMUNITY): Payer: Medicare Other

## 2020-11-30 ENCOUNTER — Encounter (HOSPITAL_COMMUNITY): Payer: Self-pay | Admitting: *Deleted

## 2020-11-30 ENCOUNTER — Emergency Department (HOSPITAL_COMMUNITY)
Admission: EM | Admit: 2020-11-30 | Discharge: 2020-12-01 | Disposition: A | Discharge status: Home / Self Care | Payer: Medicare Other | Source: Home / Self Care

## 2020-11-30 ENCOUNTER — Other Ambulatory Visit: Payer: Self-pay

## 2020-11-30 ENCOUNTER — Emergency Department (HOSPITAL_COMMUNITY)
Admission: EM | Admit: 2020-11-30 | Discharge: 2020-11-30 | Disposition: A | Discharge status: Home / Self Care | Payer: Medicare Other | Attending: Emergency Medicine | Admitting: Emergency Medicine

## 2020-11-30 ENCOUNTER — Encounter (HOSPITAL_COMMUNITY): Payer: Self-pay | Admitting: Emergency Medicine

## 2020-11-30 DIAGNOSIS — R0981 Nasal congestion: Secondary | ICD-10-CM | POA: Insufficient documentation

## 2020-11-30 DIAGNOSIS — U071 COVID-19: Secondary | ICD-10-CM | POA: Insufficient documentation

## 2020-11-30 DIAGNOSIS — J029 Acute pharyngitis, unspecified: Secondary | ICD-10-CM | POA: Insufficient documentation

## 2020-11-30 DIAGNOSIS — R062 Wheezing: Secondary | ICD-10-CM | POA: Insufficient documentation

## 2020-11-30 DIAGNOSIS — Z5321 Procedure and treatment not carried out due to patient leaving prior to being seen by health care provider: Secondary | ICD-10-CM | POA: Insufficient documentation

## 2020-11-30 DIAGNOSIS — R0602 Shortness of breath: Secondary | ICD-10-CM | POA: Diagnosis present

## 2020-11-30 NOTE — ED Triage Notes (Addendum)
The pt has had the vaccines  For covid

## 2020-11-30 NOTE — ED Triage Notes (Addendum)
Pt reports that she was diagnosed with COVID yesterday and has had symptoms since Tuesday. Today, SOB worsened and reports wheezing. Pt sounds congested. No obvious distress noted. Pt was vaccinated.

## 2020-11-30 NOTE — ED Triage Notes (Signed)
The pt had a positive covid test yesterday  She apparently left [facility] without being seen in the past little while  The pt is c.o a sorethraot some shortness of breath and some wheezes  None audible no respiratory distress low grade temp

## 2020-11-30 NOTE — ED Notes (Signed)
This nurse was notified patient arrived at [facility]

## 2020-12-01 NOTE — ED Notes (Signed)
Pt stated to me she is feeling better and doesn't want to wait any longer to be seen.

## 2020-12-04 ENCOUNTER — Ambulatory Visit: Payer: No Typology Code available for payment source

## 2020-12-08 ENCOUNTER — Ambulatory Visit: Payer: No Typology Code available for payment source

## 2020-12-19 ENCOUNTER — Ambulatory Visit: Payer: No Typology Code available for payment source | Attending: Physical Medicine & Rehabilitation

## 2020-12-19 ENCOUNTER — Other Ambulatory Visit: Payer: Self-pay

## 2020-12-19 DIAGNOSIS — M6281 Muscle weakness (generalized): Secondary | ICD-10-CM | POA: Diagnosis present

## 2020-12-19 DIAGNOSIS — R600 Localized edema: Secondary | ICD-10-CM | POA: Diagnosis present

## 2020-12-19 DIAGNOSIS — R2689 Other abnormalities of gait and mobility: Secondary | ICD-10-CM | POA: Diagnosis present

## 2020-12-19 DIAGNOSIS — M25571 Pain in right ankle and joints of right foot: Secondary | ICD-10-CM | POA: Insufficient documentation

## 2020-12-19 DIAGNOSIS — M25671 Stiffness of right ankle, not elsewhere classified: Secondary | ICD-10-CM | POA: Diagnosis present

## 2020-12-20 ENCOUNTER — Encounter: Payer: Self-pay | Admitting: Physical Medicine & Rehabilitation

## 2020-12-20 ENCOUNTER — Encounter
Payer: No Typology Code available for payment source | Attending: Physical Medicine & Rehabilitation | Admitting: Physical Medicine & Rehabilitation

## 2020-12-20 VITALS — BP 121/73 | HR 89 | Temp 98.3°F | Ht 60.0 in | Wt 300.0 lb

## 2020-12-20 DIAGNOSIS — M797 Fibromyalgia: Secondary | ICD-10-CM | POA: Diagnosis not present

## 2020-12-20 DIAGNOSIS — Q052 Lumbar spina bifida with hydrocephalus: Secondary | ICD-10-CM | POA: Diagnosis not present

## 2020-12-20 DIAGNOSIS — S82851S Displaced trimalleolar fracture of right lower leg, sequela: Secondary | ICD-10-CM | POA: Diagnosis present

## 2020-12-20 DIAGNOSIS — M19171 Post-traumatic osteoarthritis, right ankle and foot: Secondary | ICD-10-CM | POA: Diagnosis not present

## 2020-12-20 NOTE — Patient Instructions (Signed)
Hold therapy for 1 month  See primary care for post Covid follow up  Discuss weight loss surgery with primary care

## 2020-12-20 NOTE — Progress Notes (Signed)
Subjective:    Patient ID: [name], female    DOB: 11-16-1971, 50 y.o.   MRN: [id number] 50 yo female with BMI 58, history of spina bifida and type II Chiari malformation with who is referred by Dr. Victorino DikeHewitt for right ankle and foot pain.  The patient had a work related injury when she was employed at [employer] on 04/12/2018.  The patient  [facility]. She underwent closed reduction of the fracture dislocation. The patient followed up with Dr. Duwayne HeckJason Rogers and underwent right ankle open reduction internal fixation of a trimalleolar fracture. The patient was seen at: Inpatient rehab unit, Dr. Ivory BroadZach Swartz was the attending physician.  The patient was nonweightbearing.  On admission to rehab required mod assist stand pivot transfers mod to max with ADLs.  Upon discharge from rehab the patient required supervision for car transfers with sliding board.  Modified independent upper extremity ADLs but did require assistance for lower body ADLs. Has had follow up with ortho foot and ankle Dr Victorino DikeHewitt fusion is stable  Pt received home health PT after CIR admission.  The patient and husband state that she has not been able to access the bathroom with her scooter.  Rehab discharge summary indicated that she could access the bathroom but it did indicate whether this was with a wheelchair. On 05/18/2018 patient had hardware removal due to loss of fixation with placement of external fixator. On 06/15/2019 was removal of external fixator with placement of Steinmann pin On 12/27/2018 patient underwent right ankle tibiotalar calcaneal arthrodesis by Dr. Victorino DikeHewitt. Numbness in RIght lower ext, swelling, problems with toilet and bed transfer.  Feels numbness came on after the external fixator placement   HPI  50 year old female with history of morbid obesity, Chiari malformation, spina bifida, who is here for work-related injury, right bimalleolar fracture in 2019.  Please see  history related above.  The patient has been going through physical therapy and has been making some improvements.  At one point she was walking 30 feet.  She was nonambulatory when she first came to this clinic 04/03/2020. Now able to get on and off toilet.  Unfortunately the patient was infected with COVID over Christmas.  After quarantining, she return to physical therapy but has noted elevated heart rate during ambulation up to 140 bpm.  She has some wheezing during therapy as well.  She has not followed up with her primary physician since she has had COVID.  She has no breathing problems at rest.  Her O2 sat looks fine today  Walking distance down to 14 feet  Right knee pain-   Circ aide wrap  Pain Inventory Average Pain 4 Pain Right Now 3 My pain is intermittent  In the last 24 hours, has pain interfered with the following? General activity 10 Relation with others 10 Enjoyment of life 10 What TIME of day is your pain at its worst? morning , daytime, evening and night Sleep (in general) Fair  Pain is worse with: walking and standing Pain improves with: rest Relief from Meds: 8  Family History  Problem Relation Age of Onset  . Non-Hodgkin's lymphoma Mother   . Hypertension Mother   . Hypertension Father    Social History   Socioeconomic History  . Marital status: Married    Spouse name: Not on file  . Number of children: Not on file  . Years of education: Not on file  . Highest education level: Not on file  Occupational History  . Not on file  Tobacco Use  . Smoking status: Never Smoker  . Smokeless tobacco: Never Used  Vaping Use  . Vaping Use: Never used  Substance and Sexual Activity  . Alcohol use: Never  . Drug use: Never  . Sexual activity: Not on file  Other Topics Concern  . Not on file  Social History Narrative  . Not on file   Social Determinants of Health   Financial Resource Strain: Not on file  Food Insecurity: Not on file  Transportation  Needs: Not on file  Physical Activity: Not on file  Stress: Not on file  Social Connections: Not on file   Past Surgical History:  Procedure Laterality Date  . ANKLE ARTHROSCOPY Left    related to Tendon  . ANKLE FUSION Right 12/23/2018   Procedure: right tibiotalar calcaneal arthrodesis;  Surgeon: Toni Arthurs, MD;  Location: [facility] OR;  Service: Orthopedics;  Laterality: Right;   . ANKLE SURGERY Right 12/23/2018  . BACK SURGERY  1971   Spinal Bifida  . BACK SURGERY  2012   fusion  . EXTERNAL FIXATION LEG Right 05/18/2018   Procedure: EXTERNAL FIXATION LEG;  Surgeon: Yolonda Kida, MD;  Location: [facility] OR;  Service: Orthopedics;  Laterality: Right;  . EXTERNAL FIXATION REMOVAL Right 06/14/2018   Procedure: Right ankle external fixator removal with steinman pin placement;  Surgeon: Yolonda Kida, MD;  Location: [facility] OR;  Service: Orthopedics;  Laterality: Right;  60 mins  . HARDWARE REMOVAL Right 05/18/2018   Procedure: HARDWARE REMOVAL;  Surgeon: Yolonda Kida, MD;  Location: [facility] OR;  Service: Orthopedics;  Laterality: Right;  . HARDWARE REMOVAL Right 12/23/2018   Procedure: RIGHT ANKLE REMOVAL OF DEEP IMPLANTS;  Surgeon: Toni Arthurs, MD;  Location: [facility] OR;  Service: Orthopedics;  Laterality: Right;  . KNEE ARTHROSCOPY Left 1983  . ORIF ANKLE FRACTURE Right 04/23/2018   Procedure: OPEN REDUCTION INTERNAL FIXATION (ORIF) TRIMALLEOLAR ANKLE FRACTURE;  Surgeon: Yolonda Kida, MD;  Location: [facility] ORS;  Service: Orthopedics;  Laterality: Right;  120 mins  . ORIF ANKLE FRACTURE Right 05/18/2018   Procedure: Right ankle open reduction internal fixation revision;  Surgeon: Yolonda Kida, MD;  Location: [facility] OR;  Service: Orthopedics;  Laterality: Right;  120 mins  . SHUNT EXTERNALIZATION     Head x 2  . TIBIA OSTEOTOMY Right 12/23/2018   Procedure: RIGHT MEDIAL MALLEOLUS OSTEOTOMY;  Surgeon: Toni Arthurs, MD;  Location: [facility] OR;  Service: Orthopedics;  Laterality:  Right;  . WISDOM TOOTH EXTRACTION     Past Surgical History:  Procedure Laterality Date  . ANKLE ARTHROSCOPY Left    related to Tendon  . ANKLE FUSION Right 12/23/2018   Procedure: right tibiotalar calcaneal arthrodesis;  Surgeon: Toni Arthurs, MD;  Location: [facility] OR;  Service: Orthopedics;  Laterality: Right;   . ANKLE SURGERY Right 12/23/2018  . BACK SURGERY  1971   Spinal Bifida  . BACK SURGERY  2012   fusion  . EXTERNAL FIXATION LEG Right 05/18/2018   Procedure: EXTERNAL FIXATION LEG;  Surgeon: Yolonda Kida, MD;  Location: [facility] OR;  Service: Orthopedics;  Laterality: Right;  . EXTERNAL FIXATION REMOVAL Right 06/14/2018   Procedure: Right ankle external fixator removal with steinman pin placement;  Surgeon: Yolonda Kida, MD;  Location: [facility] OR;  Service: Orthopedics;  Laterality: Right;  60 mins  . HARDWARE REMOVAL Right 05/18/2018   Procedure: HARDWARE REMOVAL;  Surgeon: Yolonda Kida, MD;  Location: [facility] OR;  Service: Orthopedics;  Laterality: Right;  . HARDWARE REMOVAL Right 12/23/2018   Procedure: RIGHT ANKLE REMOVAL OF DEEP IMPLANTS;  Surgeon: Toni Arthurs, MD;  Location: [facility] OR;  Service: Orthopedics;  Laterality: Right;  . KNEE ARTHROSCOPY Left 1983  . ORIF ANKLE FRACTURE Right 04/23/2018   Procedure: OPEN REDUCTION INTERNAL FIXATION (ORIF) TRIMALLEOLAR ANKLE FRACTURE;  Surgeon: Yolonda Kida, MD;  Location: [facility];  Service: Orthopedics;  Laterality: Right;  120 mins  . ORIF ANKLE FRACTURE Right 05/18/2018   Procedure: Right ankle open reduction internal fixation revision;  Surgeon: Yolonda Kida, MD;  Location: [facility] OR;  Service: Orthopedics;  Laterality: Right;  120 mins  . SHUNT EXTERNALIZATION     Head x 2  . TIBIA OSTEOTOMY Right 12/23/2018   Procedure: RIGHT MEDIAL MALLEOLUS OSTEOTOMY;  Surgeon: Toni Arthurs, MD;  Location: [facility] OR;  Service: Orthopedics;  Laterality: Right;  . WISDOM TOOTH EXTRACTION     Past Medical History:  Diagnosis  Date  . Chiari malformation type II (HCC)   . Family history of adverse reaction to anesthesia    grandfather died from anesthesia; grandfather had malignant hyperthermia  . GERD (gastroesophageal reflux disease)   . Hydrocephalus (HCC)   . Malignant hyperthermia   . Neuromuscular disorder (HCC)    Neuropathy  . Osteoarthritis   . PONV (postoperative nausea and vomiting)   . Spina bifida (HCC)    BP 121/73   Pulse 89   Temp 98.3 F (36.8 C)   Ht 5' (1.524 m)   Wt 300 lb (136.1 kg) Comment: approx  LMP 11/24/2020   SpO2 96%   BMI 58.59 kg/m   Opioid Risk Score:   Fall Risk Score:  `1  Depression screen PHQ 2/9  Depression screen [facility] 2/9 10/19/2020 09/07/2020 04/03/2020  Decreased Interest 1 1 2   Down, Depressed, Hopeless 1 1 2   PHQ - 2 Score 2 2 4   Altered sleeping - - 2  Tired, decreased energy - - 2  Change in appetite - - 2  Feeling bad or failure about yourself  - - 2  Trouble concentrating - - 2  Moving slowly or fidgety/restless - - 0  Suicidal thoughts - - 0  PHQ-9 Score - - 14  Difficult doing work/chores - - Extremely dIfficult   Review of Systems  Constitutional: Negative.   HENT: Negative.   Eyes: Negative.   Respiratory: Negative.   Cardiovascular: Negative.   Gastrointestinal: Negative.   Endocrine: Negative.   Genitourinary: Negative.   Musculoskeletal: Positive for arthralgias and gait problem.  Skin: Negative.   Allergic/Immunologic: Negative.   Hematological: Negative.   Psychiatric/Behavioral: Negative.   All other systems reviewed and are negative.      Objective:   Physical Exam Vitals and nursing note reviewed.  Constitutional:      Appearance: She is obese.  HENT:     Head: Normocephalic and atraumatic.  Eyes:     Extraocular Movements: Extraocular movements intact.     Conjunctiva/sclera: Conjunctivae normal.     Pupils: Pupils are equal, round, and reactive to light.  Cardiovascular:     Rate and Rhythm: Normal rate and  regular rhythm.     Heart sounds: Normal heart sounds. No murmur heard.   Pulmonary:     Effort: Pulmonary effort is normal. No respiratory distress.     Breath sounds: Normal breath sounds. No stridor. No wheezing or rhonchi.  Musculoskeletal:     Right lower leg:  Edema present.     Left lower leg: Edema present.     Comments: Pitting edema 1+ at the right ankle and trace at the left ankle. There is no evidence of erythema in the pretibial area. Right knee no evidence of effusion although exam is difficult secondary to obesity. There is mild tenderness palpation at the patellar tendon on the right side.  No pain with right knee range of motion no tenderness over the quadricep tendon.   Skin:    General: Skin is warm and dry.  Neurological:     Mental Status: She is alert and oriented to person, place, and time.     Comments: Negative straight leg raising test Reduced sensation at the right foot and ankle area there is intact sensation at the right knee. Left lower extremity 5/5 in the knee extensor ankle dorsiflex and plantarflex her 4/5 on the right side at the knee extensor cannot test ankle dorsiflexion plantar flexor secondary to ankle fusion.  Psychiatric:        Mood and Affect: Mood normal.        Behavior: Behavior normal.    Lungs are clear Heart regular rate and rhythm no murmurs       Assessment & Plan:  1.  Sequela of trimalleolar right ankle fracture status post ankle fusion.  Because of the patient's comorbidities of morbid obesity, history of spina bifida, history of Chiari malformation, the patient has had very slow recovery.  Unfortunately she has had COVID last month and is having what sounds like postinfectious bronchospasm.  Tachycardia is likely related to pulmonary issues.  She will need follow-up primary physician.  She should stop her physical therapy for 1 month. We discussed her overall mobility and the impact of her weight on ambulation abilities.  I  recommended that she discuss weight reduction surgery with her primary care physician. In terms of medications we will continue Lyrica 100 mg 3 times daily I will see her back in 3 months 2.  Right knee pain does not appear to be severe, exam is fairly unremarkable has some mild tenderness over the patellar tendon.  She is to call if her knee is not feeling better after holding PT for a month.  At this time I do not think imaging studies are needed unless pain does not subside.

## 2020-12-20 NOTE — Therapy (Addendum)
[place], [place], [address] Phone: [phone]   Fax:  [phone]  Physical Therapy Treatment/Re-Cert/Discharge  Patient Details  Name: [name] MRN: [id number] Date of Birth: 02-27-1971 Referring Provider (PT): Charlett Blake, MD   Encounter Date: 12/19/2020   PT End of Session - 12/19/20 1846     Visit Number 14    Number of Visits 22    Date for PT Re-Evaluation 11/30/20    Authorization Type Pt is approved for 12 more visits    Authorization Time Period 12 visits (2x/week for 6 weeks)    Authorization - Visit Number 4    Authorization - Number of Visits 12    PT Start Time 1838    PT Stop Time 1920    PT Time Calculation (min) 42 min             Past Medical History:  Diagnosis Date   Chiari malformation type II ([place])    Family history of adverse reaction to anesthesia    grandfather died from anesthesia; grandfather had malignant hyperthermia   GERD (gastroesophageal reflux disease)    Hydrocephalus ([place])    Malignant hyperthermia    Neuromuscular disorder ([place])    Neuropathy   Osteoarthritis    PONV (postoperative nausea and vomiting)    Spina bifida ([place])     Past Surgical History:  Procedure Laterality Date   ANKLE ARTHROSCOPY Left    related to Tendon   ANKLE FUSION Right 12/23/2018   Procedure: right tibiotalar calcaneal arthrodesis;  Surgeon: Wylene Simmer, MD;  Location: [place];  Service: Orthopedics;  Laterality: Right;  158mn   ANKLE SURGERY Right 12/23/2018   BACK SURGERY  1971   Spinal Bifida   BACK SURGERY  2012   fusion   EXTERNAL FIXATION LEG Right 05/18/2018   Procedure: EXTERNAL FIXATION LEG;  Surgeon: RNicholes Stairs MD;  [place] [facility]  Service: Orthopedics;  Laterality: Right;   EXTERNAL FIXATION REMOVAL Right 06/14/2018   Procedure: Right ankle external fixator removal with steinman pin placement;  Surgeon: RNicholes Stairs MD;   [place]  Service: Orthopedics;  Laterality: Right;  60 mins   HARDWARE REMOVAL Right 05/18/2018   Procedure: HARDWARE REMOVAL;  Surgeon: RNicholes Stairs MD;  [place] [facility]  Service: Orthopedics;  Laterality: Right;   HARDWARE REMOVAL Right 12/23/2018   Procedure: RIGHT ANKLE REMOVAL OF DEEP IMPLANTS;  Surgeon: HWylene Simmer MD;  [place] [facility]  Service: Orthopedics;  Laterality: Right;   KNEE ARTHROSCOPY Left 1983   ORIF ANKLE FRACTURE Right 04/23/2018   Procedure: OPEN REDUCTION INTERNAL FIXATION (ORIF) TRIMALLEOLAR ANKLE FRACTURE;  Surgeon: RNicholes Stairs MD;  [facility];  Service: Orthopedics;  Laterality: Right;  120 mins   ORIF ANKLE FRACTURE Right 05/18/2018   Procedure: Right ankle open reduction internal fixation revision;  Surgeon: RNicholes Stairs MD;  [place]  Service: Orthopedics;  Laterality: Right;  120 mins   SHUNT EXTERNALIZATION     Head x 2   TIBIA OSTEOTOMY Right 12/23/2018   Procedure: RIGHT MEDIAL MALLEOLUS OSTEOTOMY;  Surgeon: HWylene Simmer MD;  [place] [facility]  Service: Orthopedics;  Laterality: Right;   WISDOM TOOTH EXTRACTION      There were no vitals filed for this visit.   Subjective Assessment - 12/19/20 1844     Subjective Pt reports she has had covid around Christmas/New Years which has created issues with SOB, coughing,  fatigue, and lightheadedness. Pt also reports her R knee has been hurting and she has been experiencing popping with her knee. Since having covid, pt reports she has felt up to walking or completing her ther ex.    Pertinent History Spina bifida of lumbosacral region with hydrocephalus, history of Chiari malformation, previous ankle surgeries    Currently in Pain? Yes    Pain Score 4     Pain Location Ankle    Pain Orientation Right    Pain Descriptors / Indicators Aching    Pain Type Chronic pain    Pain Onset More than a month ago    Pain Frequency Intermittent    Aggravating Factors  WBing     Pain Relieving Factors Rest    Effect of Pain on Daily Activities Dcreased mobility    Multiple Pain Sites Yes    Pain Score 4    Pain Location Knee    Pain Orientation Right    Pain Descriptors / Indicators Aching;Sharp    Pain Type Acute pain    Pain Onset 1 to 4 weeks ago    Pain Frequency Intermittent    Aggravating Factors  rest or movement    Pain Relieving Factors "I haven't found anything"                               [facility] Adult PT Treatment/Exercise - 12/20/20 0001       Ambulation/Gait   Ambulation/Gait Yes    Ambulation/Gait Assistance 5: Supervision    Ambulation Distance (Feet) 40 Feet   46f x 2 c a rest break   Assistive device Rolling walker    Gait Pattern Poor foot clearance - right   audible pops of the R knee on 2 occasions   Gait Comments Amb of 14 ft c RW and gait belt. Pt completed 4 attempts with HR ranging from 136-140 BPM and O2 sat ranging from 96% to 98%. Other than being tired and SOB, pt reported no exertional distress.      Knee/Hip Exercises: Seated   Long Arc Quad Right;Left;10 reps;2 sets    Marching Right;Left;10 reps    Abduction/Adduction  10 reps;Right;Left    Abd/Adduction Limitations ball for add; 4 lbs for abd                    PT Education - 12/20/20 0814     Education Details To complete her HEP and walk daily    Person(s) Educated Patient    Methods Explanation    Comprehension Verbalized understanding              PT Short Term Goals - 08/23/20 1939       PT SHORT TERM GOAL #2   Title Patient will report consistency with exercises and performance of frequent short walks at home. Achieved-  Walks are short and completed with A from husband    Status Achieved    Target Date 08/23/20               PT Long Term Goals - 08/23/20 1942       PT LONG TERM GOAL #1   Title Patient will be I with final HEP to maintain progress made from PT. On-going    Period Weeks    Status  On-going    Target Date 09/08/20      PT LONG TERM GOAL #2   Title Patient  will be able to ambulate >/= 40 ft with RW in order to improve household mobility. On-going- 30 ft c RW and A for safety    Time 6    Period Weeks    Status On-going    Target Date 09/08/20      PT LONG TERM GOAL #3   Title Patient will be able >/= 5 minutes without support in order to be able to get herself something to eat or drink. On-going- Pt is completing standing activities at counter c rest break needed after each stand    Time 6    Period Weeks    Status New    Target Date 09/08/20                   Plan - 12/20/20 0817     Clinical Impression Statement Pt reports to PT after having covid. Pt's activity tolerance and strength was decreased today. Pt reports symptoms of SOB, lightheadedness, fatigue and coughing. Pt's tolerance to activity was decreased with pt tolerating walking 21f c a RW, previously 212f Pt's resting HR was elevated at 98 BPM. She states her resting HR is usually in the 70s and 80s. With 4 attempts of ambulation of 1446fpt's HR increased to 136-140 BPM. O2 sats were 96-98%. Other than being tired and experiencing SOB with each attempt, pt reported no exertional distress. Pt was instructed in pursed lip breathing for recovery. Pt was encouraged to walk and complete her HEP on a daily basis as tolerated. Pt voiced understanding.    Personal Factors and Comorbidities Fitness;Past/Current Experience;Time since onset of injury/illness/exacerbation;Transportation    Comorbidities Spina bifida of lumbosacral region with hydrocephalus, history of Chiari malformation, pevious ankle surgeries, BMI, chronic back pain    Examination-Activity Limitations Bathing;Locomotion Level;Transfers;Dressing;Hygiene/Grooming;Stand;Stairs;Squat;Sleep    Examination-Participation Restrictions Meal Prep;Cleaning;Occupation;Community Activity;Driving;Shop;Laundry;Yard Work    StaMultimedia programmerolving/Moderate complexity    Clinical Decision Making Moderate    Rehab Potential Good    PT Frequency 2x / week    PT Duration 12 weeks    PT Treatment/Interventions ADLs/Self Care Home Management;Cryotherapy;Electrical Stimulation;Iontophoresis 4mg34m Dexamethasone;Moist Heat;Neuromuscular re-education;Balance training;Therapeutic exercise;Therapeutic activities;Functional mobility training;Stair training;Gait training;Patient/family education;Manual techniques;Passive range of motion;Dry needling;Joint Manipulations;Taping;Vasopneumatic Device    PT Next Visit Plan Progress ther ex/HEP and functional mobility aa tolerated    PT Home Exercise Plan [id number]: ankle pumps (ankle AROM all directions), supine heel slide, supine marching, supine hip abduction/adduction, bridge, LAQ, seated marching, standing lateral weight shifts; forward/backward steps c RW, STS from reciner t/f RW. Pt's resting HR was elevated at 98 BPM. pt reports she is usually in to 70s or 80s c her HR. With walking 14 ft x 4 attempts, the pt's HR increased to between 136-140 BPM. Pt's O2 sat remained consistent at 98%. Pt needed 90sec for recovery to pre-walk O2 sat and HR levels. With walking pt also experinced R knee pain.    Recommended Other Services With appt with Dr. KirsLetta Pateorrow, let him know about your elevated HR and new r knee pain.    Consulted and Agree with Plan of Care Patient             Patient will benefit from skilled therapeutic intervention in order to improve the following deficits and impairments:  Abnormal gait,Difficulty walking,Decreased activity tolerance,Pain,Decreased strength,Decreased mobility,Increased edema,Decreased endurance,Decreased balance  Visit Diagnosis: Other abnormalities of gait and mobility  Muscle weakness (generalized)  Pain in right ankle and joints of right foot  Localized edema  Stiffness of right ankle, not elsewhere classified     Problem  List Patient Active Problem List   Diagnosis Date Noted   Post-traumatic arthritis of ankle, right 12/23/2018   Closed right ankle fracture 06/14/2018   Hypoalbuminemia due to protein-calorie malnutrition (HCC)    Acute blood loss anemia    Spina bifida of lumbosacral region with hydrocephalus [facility])    History of Chiari malformation    Trimalleolar fracture of ankle, closed, left, initial encounter 04/29/2018   Closed displaced trimalleolar fracture of right ankle 04/23/2018    Gar Ponto MS, PT 12/20/20 1:08 PM  [facility] [address] [place], [place], [address] Phone: [phone]   Fax:  [phone]  Name: [name] MRN: [id number] Date of Birth: 23-Jan-1971  PHYSICAL THERAPY DISCHARGE SUMMARY  Visits from Start of Care: 14  Current functional level related to goals / functional outcomes: See above   Remaining deficits: See above   Education / Equipment: HEP  Patient agrees to discharge. Patient goals were partially met. Patient is being discharged due to not returning since the last visit.  Terez Freimark MS, PT 07/04/21 10:08 AM

## 2021-01-05 ENCOUNTER — Ambulatory Visit: Payer: Medicare Other

## 2021-01-23 ENCOUNTER — Ambulatory Visit: Payer: Medicare Other

## 2021-01-26 ENCOUNTER — Ambulatory Visit: Payer: No Typology Code available for payment source

## 2021-02-07 ENCOUNTER — Other Ambulatory Visit: Payer: Self-pay | Admitting: Neurological Surgery

## 2021-02-07 DIAGNOSIS — G919 Hydrocephalus, unspecified: Secondary | ICD-10-CM

## 2021-03-01 ENCOUNTER — Ambulatory Visit
Admission: RE | Admit: 2021-03-01 | Discharge: 2021-03-01 | Disposition: A | Discharge status: Home / Self Care | Payer: Medicare Other | Source: Ambulatory Visit | Attending: Neurological Surgery | Admitting: Neurological Surgery

## 2021-03-01 DIAGNOSIS — G919 Hydrocephalus, unspecified: Secondary | ICD-10-CM

## 2021-03-08 ENCOUNTER — Other Ambulatory Visit: Payer: Self-pay

## 2021-03-08 ENCOUNTER — Encounter
Payer: No Typology Code available for payment source | Attending: Physical Medicine & Rehabilitation | Admitting: Physical Medicine & Rehabilitation

## 2021-03-08 ENCOUNTER — Encounter: Payer: Self-pay | Admitting: Physical Medicine & Rehabilitation

## 2021-03-08 VITALS — BP 142/91 | HR 82 | Temp 98.7°F

## 2021-03-08 DIAGNOSIS — S82851S Displaced trimalleolar fracture of right lower leg, sequela: Secondary | ICD-10-CM | POA: Insufficient documentation

## 2021-03-08 DIAGNOSIS — M19171 Post-traumatic osteoarthritis, right ankle and foot: Secondary | ICD-10-CM | POA: Diagnosis present

## 2021-03-08 DIAGNOSIS — Q052 Lumbar spina bifida with hydrocephalus: Secondary | ICD-10-CM | POA: Insufficient documentation

## 2021-03-08 MED ORDER — METHOCARBAMOL 500 MG PO TABS: 500.0000 mg | ORAL_TABLET | Freq: Every evening | ORAL | 2 refills | Status: DC | PRN

## 2021-03-08 MED ORDER — PREGABALIN 100 MG PO CAPS: 100.0000 mg | ORAL_CAPSULE | Freq: Three times a day (TID) | ORAL | 5 refills | Status: DC

## 2021-03-08 NOTE — Progress Notes (Signed)
Subjective:    Patient ID: [name], female    DOB: [birth year], 50 y.o.   MRN: [id number] 50 yo female with BMI 58, history of spina bifida and type II Chiari malformation with who is referred by Dr. Victorino Dike for right ankle and foot pain.  The patient had a work related injury when she was employed at [employer] on 04/12/2018.  The patient  presented to the [facility] emergency department. She underwent closed reduction of the fracture dislocation. The patient followed up with Dr. Duwayne Heck and underwent right ankle open reduction internal fixation of a trimalleolar fracture. The patient was seen at: Inpatient rehab unit, Dr. Ivory Broad was the attending physician.  The patient was nonweightbearing.  On admission to rehab required mod assist stand pivot transfers mod to max with ADLs.  Upon discharge from rehab the patient required supervision for car transfers with sliding board.  Modified independent upper extremity ADLs but did require assistance for lower body ADLs. Has had follow up with ortho foot and ankle Dr Victorino Dike fusion is stable  Pt received home health PT after CIR admission.  The patient and husband state that she has not been able to access the bathroom with her scooter.  Rehab discharge summary indicated that she could access the bathroom but it did indicate whether this was with a wheelchair. On 05/18/2018 patient had hardware removal due to loss of fixation with placement of external fixator. On 06/15/2019 was removal of external fixator with placement of Steinmann pin On 12/27/2018 patient underwent right ankle tibiotalar calcaneal arthrodesis by Dr. Victorino Dike. Numbness in RIght lower ext, swelling, problems with toilet and bed transfer.  Feels numbness came on after the external fixator placement  At one point she was walking 30 feet.  She was nonambulatory when she first came to this clinic 04/03/2020. Now able to get on and off toilet.  Unfortunately the patient was infected  with COVID over Christmas.  After quarantining, she return to physical therapy but has noted elevated heart rate during ambulation up to 140 bpm.  She has some wheezing during therapy as well.  She has not followed up with her primary physician since she has had COVID.  She has no breathing problems at rest.    HPI  Pt still having problem with heart racing, Some SOB and CP, pt has seen cardiology , stress myoview and and CT chest normal, the patient is scheduled for an echocardiogram.  She also is seeing her neurosurgeon Dr. Danielle Dess after a CT of the head.  I looked at the report and the films and did not see any acute changes no's hydrocephalus.  The patient since Covid has felt a bit more foggy in the head.  We discussed that this is also fairly common.  The patient is ambulating less than 15 feet now.  We discussed the importance of a standing program.  Her cardiologist said it was safe to do ambulation. Right leg spasms similar to prior , responded well to methocarbamol prescribed by Ortho, Dr Victorino Dike Pain Inventory Average Pain 4 Pain Right Now 4 My pain is sharp, stabbing and aching  In the last 24 hours, has pain interfered with the following? General activity 10 Relation with others 2 Enjoyment of life 5 What TIME of day is your pain at its worst? varies Sleep (in general) Fair  Pain is worse with: standing Pain improves with: rest Relief from Meds: 5  Family History  Problem Relation Age of Onset  .  Non-Hodgkin's lymphoma Mother   . Hypertension Mother   . Hypertension Father    Social History   Socioeconomic History  . Marital status: Married    Spouse name: Not on file  . Number of children: Not on file  . Years of education: Not on file  . Highest education level: Not on file  Occupational History  . Not on file  Tobacco Use  . Smoking status: Never Smoker  . Smokeless tobacco: Never Used  Vaping Use  . Vaping Use: Never used  Substance and Sexual Activity  .  Alcohol use: Never  . Drug use: Never  . Sexual activity: Not on file  Other Topics Concern  . Not on file  Social History Narrative  . Not on file   Social Determinants of Health   Financial Resource Strain: Not on file  Food Insecurity: Not on file  Transportation Needs: Not on file  Physical Activity: Not on file  Stress: Not on file  Social Connections: Not on file   Past Surgical History:  Procedure Laterality Date  . ANKLE ARTHROSCOPY Left    related to Tendon  . ANKLE FUSION Right 12/23/2018   Procedure: right tibiotalar calcaneal arthrodesis;  Surgeon: Toni Arthurs, MD;  Location: [facility] OR;  Service: Orthopedics;  Laterality: Right;   . ANKLE SURGERY Right 12/23/2018  . BACK SURGERY  1971   Spinal Bifida  . BACK SURGERY  2012   fusion  . EXTERNAL FIXATION LEG Right 05/18/2018   Procedure: EXTERNAL FIXATION LEG;  Surgeon: Yolonda Kida, MD;  Location: [facility] OR;  Service: Orthopedics;  Laterality: Right;  . EXTERNAL FIXATION REMOVAL Right 06/14/2018   Procedure: Right ankle external fixator removal with steinman pin placement;  Surgeon: Yolonda Kida, MD;  Location: [facility] OR;  Service: Orthopedics;  Laterality: Right;  60 mins  . HARDWARE REMOVAL Right 05/18/2018   Procedure: HARDWARE REMOVAL;  Surgeon: Yolonda Kida, MD;  Location: [facility] OR;  Service: Orthopedics;  Laterality: Right;  . HARDWARE REMOVAL Right 12/23/2018   Procedure: RIGHT ANKLE REMOVAL OF DEEP IMPLANTS;  Surgeon: Toni Arthurs, MD;  Location: [facility] OR;  Service: Orthopedics;  Laterality: Right;  . KNEE ARTHROSCOPY Left 1983  . ORIF ANKLE FRACTURE Right 04/23/2018   Procedure: OPEN REDUCTION INTERNAL FIXATION (ORIF) TRIMALLEOLAR ANKLE FRACTURE;  Surgeon: Yolonda Kida, MD;  Location: [facility] ORS;  Service: Orthopedics;  Laterality: Right;  120 mins  . ORIF ANKLE FRACTURE Right 05/18/2018   Procedure: Right ankle open reduction internal fixation revision;  Surgeon: Yolonda Kida, MD;   Location: [facility] OR;  Service: Orthopedics;  Laterality: Right;  120 mins  . SHUNT EXTERNALIZATION     Head x 2  . TIBIA OSTEOTOMY Right 12/23/2018   Procedure: RIGHT MEDIAL MALLEOLUS OSTEOTOMY;  Surgeon: Toni Arthurs, MD;  Location: [facility] OR;  Service: Orthopedics;  Laterality: Right;  . WISDOM TOOTH EXTRACTION     Past Surgical History:  Procedure Laterality Date  . ANKLE ARTHROSCOPY Left    related to Tendon  . ANKLE FUSION Right 12/23/2018   Procedure: right tibiotalar calcaneal arthrodesis;  Surgeon: Toni Arthurs, MD;  Location: [facility] OR;  Service: Orthopedics;  Laterality: Right;   . ANKLE SURGERY Right 12/23/2018  . BACK SURGERY  1971   Spinal Bifida  . BACK SURGERY  2012   fusion  . EXTERNAL FIXATION LEG Right 05/18/2018   Procedure: EXTERNAL FIXATION LEG;  Surgeon: Yolonda Kida, MD;  Location: [facility] OR;  Service:  Orthopedics;  Laterality: Right;  . EXTERNAL FIXATION REMOVAL Right 06/14/2018   Procedure: Right ankle external fixator removal with steinman pin placement;  Surgeon: Yolonda Kida, MD;  Location: [facility] OR;  Service: Orthopedics;  Laterality: Right;  60 mins  . HARDWARE REMOVAL Right 05/18/2018   Procedure: HARDWARE REMOVAL;  Surgeon: Yolonda Kida, MD;  Location: [facility] OR;  Service: Orthopedics;  Laterality: Right;  . HARDWARE REMOVAL Right 12/23/2018   Procedure: RIGHT ANKLE REMOVAL OF DEEP IMPLANTS;  Surgeon: Toni Arthurs, MD;  Location: [facility] OR;  Service: Orthopedics;  Laterality: Right;  . KNEE ARTHROSCOPY Left 1983  . ORIF ANKLE FRACTURE Right 04/23/2018   Procedure: OPEN REDUCTION INTERNAL FIXATION (ORIF) TRIMALLEOLAR ANKLE FRACTURE;  Surgeon: Yolonda Kida, MD;  Location: [facility] ORS;  Service: Orthopedics;  Laterality: Right;  120 mins  . ORIF ANKLE FRACTURE Right 05/18/2018   Procedure: Right ankle open reduction internal fixation revision;  Surgeon: Yolonda Kida, MD;  Location: [facility] OR;  Service: Orthopedics;  Laterality: Right;  120 mins  .  SHUNT EXTERNALIZATION     Head x 2  . TIBIA OSTEOTOMY Right 12/23/2018   Procedure: RIGHT MEDIAL MALLEOLUS OSTEOTOMY;  Surgeon: Toni Arthurs, MD;  Location: [facility] OR;  Service: Orthopedics;  Laterality: Right;  . WISDOM TOOTH EXTRACTION     Past Medical History:  Diagnosis Date  . Chiari malformation type II (HCC)   . Family history of adverse reaction to anesthesia    grandfather died from anesthesia; grandfather had malignant hyperthermia  . GERD (gastroesophageal reflux disease)   . Hydrocephalus (HCC)   . Malignant hyperthermia   . Neuromuscular disorder (HCC)    Neuropathy  . Osteoarthritis   . PONV (postoperative nausea and vomiting)   . Spina bifida (HCC)    BP (!) 142/91   Pulse 82   Temp 98.7 F (37.1 C)   SpO2 97%   Opioid Risk Score:   Fall Risk Score:  `1  Depression screen PHQ 2/9  Depression screen [facility] 2/9 10/19/2020 09/07/2020 04/03/2020  Decreased Interest 1 1 2   Down, Depressed, Hopeless 1 1 2   PHQ - 2 Score 2 2 4   Altered sleeping - - 2  Tired, decreased energy - - 2  Change in appetite - - 2  Feeling bad or failure about yourself  - - 2  Trouble concentrating - - 2  Moving slowly or fidgety/restless - - 0  Suicidal thoughts - - 0  PHQ-9 Score - - 14  Difficult doing work/chores - - Extremely dIfficult   Review of Systems     Objective:   Physical Exam Constitutional:      General: She is not in acute distress.    Appearance: She is obese.  HENT:     Head: Normocephalic and atraumatic.  Eyes:     Extraocular Movements: Extraocular movements intact.     Conjunctiva/sclera: Conjunctivae normal.     Pupils: Pupils are equal, round, and reactive to light.  Musculoskeletal:     Right lower leg: Edema present.     Comments: Circ aid right lower extremity No pain with upper extremity range of motion  Neurological:     Mental Status: She is alert and oriented to person, place, and time.     Comments: Motor strength is 3 - at the right hip flexor 3  at the knee extensor right ankle is fused 4/5 left hip flexor 5 at the left knee extensor 5 at the ankle dorsiflexor Sit to  stand is with contact-guard assistance.  She mostly requires some assistance for balance/stability  Psychiatric:        Mood and Affect: Mood normal.        Behavior: Behavior normal.           Assessment & Plan:  1.  History of trimalleolar fracture late sequela status post fusion with chronic pain and debility.  Unfortunately the Covid debility is slow to recover.  We discussed a standing program at home starting at around 30 seconds and working up by a minute per week.  Her husband will also ambulate with her using the walker. I will see her back in 2 months We will continue the pregabalin 100 mg 3 times daily 2.  Tachycardia and shortness of breath patient will follow up with cardiology as well as her primary care physician for the symptoms. 3.  Rightleg spasm, will order methocarbamol  qhs prn

## 2021-03-08 NOTE — Patient Instructions (Signed)
Start practicing standing 30-60 sec goal of increasing by 30 sec per week  May start walking with walker at home

## 2021-05-10 ENCOUNTER — Ambulatory Visit: Payer: Medicare Other | Admitting: Physical Medicine & Rehabilitation

## 2021-06-06 ENCOUNTER — Other Ambulatory Visit: Payer: Self-pay

## 2021-06-06 ENCOUNTER — Encounter: Payer: Self-pay | Admitting: Physical Medicine & Rehabilitation

## 2021-06-06 ENCOUNTER — Encounter
Payer: No Typology Code available for payment source | Attending: Physical Medicine & Rehabilitation | Admitting: Physical Medicine & Rehabilitation

## 2021-06-06 VITALS — BP 117/68 | HR 78 | Temp 98.7°F | Ht 60.0 in | Wt 311.0 lb

## 2021-06-06 DIAGNOSIS — M19171 Post-traumatic osteoarthritis, right ankle and foot: Secondary | ICD-10-CM

## 2021-06-06 MED ORDER — METHOCARBAMOL 500 MG PO TABS: 500.0000 mg | ORAL_TABLET | Freq: Every evening | ORAL | 2 refills | Status: DC | PRN

## 2021-06-06 NOTE — Progress Notes (Signed)
Subjective:    Patient ID: [name], female    DOB: 12-Apr-1971, 50 y.o.   MRN: [id number]  HPI 50 year old female with history of Chiari malformation, morbid obesity, who fell suffering trimalleolar fracture at work back in 2019.  She had a right ankle fusion performed by Dr. Toni Arthurs in 2020 She has history of hydrocephalus and has had a VP shunt placed due to her Chiari malformation.  Line more recently she has had tachycardia and elevated blood pressure and is now scheduled for a cardiac catheterization next week.  She states that her tachycardia as well as hypertension has improved after being started on beta-blocker The patient also has a history of seizures has seen neurology at [facility] [address]. Patient continues have difficulty with transfers her husband has to provide assistance for getting in and out of bed as well as on and off tub bench.  She can dress her upper body but needs help dressing lower body.  She has some improvement with her right lower extremity swelling after starting to use the circ aid wrap. The patient has a scooter which provides her mobility however the one that she has does not have enough power to propel her up ramps.  This is limiting her community mobility. She also misses taking a bath.  She was inquiring about a walk-in tub.  Pain Inventory Average Pain 4 Pain Right Now 4 My pain is intermittent, dull, and stabbing  In the last 24 hours, has pain interfered with the following? General activity 10 Relation with others 3 Enjoyment of life 10 What TIME of day is your pain at its worst? evening and night Sleep (in general) Fair  Pain is worse with: standing Pain improves with: rest, medication, and elevation Relief from Meds: 3  Family History  Problem Relation Age of Onset   Non-Hodgkin's lymphoma Mother    Hypertension Mother    Hypertension Father    Social History   Socioeconomic History   Marital status: Married    Spouse name: Not  on file   Number of children: Not on file   Years of education: Not on file   Highest education level: Not on file  Occupational History   Not on file  Tobacco Use   Smoking status: Never   Smokeless tobacco: Never  Vaping Use   Vaping Use: Never used  Substance and Sexual Activity   Alcohol use: Never   Drug use: Never   Sexual activity: Not on file  Other Topics Concern   Not on file  Social History Narrative   Not on file   Social Determinants of Health   Financial Resource Strain: Not on file  Food Insecurity: Not on file  Transportation Needs: Not on file  Physical Activity: Not on file  Stress: Not on file  Social Connections: Not on file   Past Surgical History:  Procedure Laterality Date   ANKLE ARTHROSCOPY Left    related to Tendon   ANKLE FUSION Right 12/23/2018   Procedure: right tibiotalar calcaneal arthrodesis;  Surgeon: Toni Arthurs, MD;  Location: [facility] OR;  Service: Orthopedics;  Laterality: Right;    ANKLE SURGERY Right 12/23/2018   BACK SURGERY  1971   Spinal Bifida   BACK SURGERY  2012   fusion   EXTERNAL FIXATION LEG Right 05/18/2018   Procedure: EXTERNAL FIXATION LEG;  Surgeon: Yolonda Kida, MD;  Location: [facility] OR;  Service: Orthopedics;  Laterality: Right;   EXTERNAL FIXATION REMOVAL Right  06/14/2018   Procedure: Right ankle external fixator removal with steinman pin placement;  Surgeon: Yolonda Kida, MD;  Location: [facility] OR;  Service: Orthopedics;  Laterality: Right;  60 mins   HARDWARE REMOVAL Right 05/18/2018   Procedure: HARDWARE REMOVAL;  Surgeon: Yolonda Kida, MD;  Location: [facility] OR;  Service: Orthopedics;  Laterality: Right;   HARDWARE REMOVAL Right 12/23/2018   Procedure: RIGHT ANKLE REMOVAL OF DEEP IMPLANTS;  Surgeon: Toni Arthurs, MD;  Location: [facility] OR;  Service: Orthopedics;  Laterality: Right;   KNEE ARTHROSCOPY Left 1983   ORIF ANKLE FRACTURE Right 04/23/2018   Procedure: OPEN REDUCTION INTERNAL FIXATION (ORIF)  TRIMALLEOLAR ANKLE FRACTURE;  Surgeon: Yolonda Kida, MD;  Location: [facility] ORS;  Service: Orthopedics;  Laterality: Right;  120 mins   ORIF ANKLE FRACTURE Right 05/18/2018   Procedure: Right ankle open reduction internal fixation revision;  Surgeon: Yolonda Kida, MD;  Location: [facility] OR;  Service: Orthopedics;  Laterality: Right;  120 mins   SHUNT EXTERNALIZATION     Head x 2   TIBIA OSTEOTOMY Right 12/23/2018   Procedure: RIGHT MEDIAL MALLEOLUS OSTEOTOMY;  Surgeon: Toni Arthurs, MD;  Location: [facility] OR;  Service: Orthopedics;  Laterality: Right;   WISDOM TOOTH EXTRACTION     Past Surgical History:  Procedure Laterality Date   ANKLE ARTHROSCOPY Left    related to Tendon   ANKLE FUSION Right 12/23/2018   Procedure: right tibiotalar calcaneal arthrodesis;  Surgeon: Toni Arthurs, MD;  Location: [facility] OR;  Service: Orthopedics;  Laterality: Right;    ANKLE SURGERY Right 12/23/2018   BACK SURGERY  1971   Spinal Bifida   BACK SURGERY  2012   fusion   EXTERNAL FIXATION LEG Right 05/18/2018   Procedure: EXTERNAL FIXATION LEG;  Surgeon: Yolonda Kida, MD;  Location: [facility] OR;  Service: Orthopedics;  Laterality: Right;   EXTERNAL FIXATION REMOVAL Right 06/14/2018   Procedure: Right ankle external fixator removal with steinman pin placement;  Surgeon: Yolonda Kida, MD;  Location: [facility] OR;  Service: Orthopedics;  Laterality: Right;  60 mins   HARDWARE REMOVAL Right 05/18/2018   Procedure: HARDWARE REMOVAL;  Surgeon: Yolonda Kida, MD;  Location: [facility] OR;  Service: Orthopedics;  Laterality: Right;   HARDWARE REMOVAL Right 12/23/2018   Procedure: RIGHT ANKLE REMOVAL OF DEEP IMPLANTS;  Surgeon: Toni Arthurs, MD;  Location: [facility] OR;  Service: Orthopedics;  Laterality: Right;   KNEE ARTHROSCOPY Left 1983   ORIF ANKLE FRACTURE Right 04/23/2018   Procedure: OPEN REDUCTION INTERNAL FIXATION (ORIF) TRIMALLEOLAR ANKLE FRACTURE;  Surgeon: Yolonda Kida, MD;  Location: [facility] ORS;   Service: Orthopedics;  Laterality: Right;  120 mins   ORIF ANKLE FRACTURE Right 05/18/2018   Procedure: Right ankle open reduction internal fixation revision;  Surgeon: Yolonda Kida, MD;  Location: [facility] OR;  Service: Orthopedics;  Laterality: Right;  120 mins   SHUNT EXTERNALIZATION     Head x 2   TIBIA OSTEOTOMY Right 12/23/2018   Procedure: RIGHT MEDIAL MALLEOLUS OSTEOTOMY;  Surgeon: Toni Arthurs, MD;  Location: [facility] OR;  Service: Orthopedics;  Laterality: Right;   WISDOM TOOTH EXTRACTION     Past Medical History:  Diagnosis Date   Chiari malformation type II (HCC)    Family history of adverse reaction to anesthesia    grandfather died from anesthesia; grandfather had malignant hyperthermia   GERD (gastroesophageal reflux disease)    Hydrocephalus (HCC)    Malignant hyperthermia    Neuromuscular disorder (HCC)  Neuropathy   Osteoarthritis    PONV (postoperative nausea and vomiting)    Spina bifida (HCC)    There were no vitals taken for this visit.  Opioid Risk Score:   Fall Risk Score:  `1  Depression screen PHQ 2/9  Depression screen [facility] 2/9 10/19/2020 09/07/2020 04/03/2020  Decreased Interest 1 1 2   Down, Depressed, Hopeless 1 1 2   PHQ - 2 Score 2 2 4   Altered sleeping - - 2  Tired, decreased energy - - 2  Change in appetite - - 2  Feeling bad or failure about yourself  - - 2  Trouble concentrating - - 2  Moving slowly or fidgety/restless - - 0  Suicidal thoughts - - 0  PHQ-9 Score - - 14  Difficult doing work/chores - - Extremely dIfficult    Review of Systems  Musculoskeletal:  Positive for gait problem.       Right ankle pain  All other systems reviewed and are negative.     Objective:   Physical Exam Vitals and nursing note reviewed.  Constitutional:      Appearance: She is obese.  HENT:     Head: Normocephalic and atraumatic.  Eyes:     Extraocular Movements: Extraocular movements intact.     Conjunctiva/sclera: Conjunctivae normal.     Pupils:  Pupils are equal, round, and reactive to light.  Musculoskeletal:     Comments: Right knee no evidence of effusion no pain to palpation.  There is chronic soft tissue swelling around the right ankle with limited ankle dorsiflexion plantarflexion.  Neurological:     Mental Status: She is alert and oriented to person, place, and time.     Sensory: Sensation is intact.     Comments: Patient is 5/5 strength in the left hip flexor knee extensor ankle dorsiflexor 4/5 in the right knee extensor and ankle dorsiflexor 3 - at the hip flexor  Lower extremity sensation intact  Psychiatric:        Mood and Affect: Mood normal.          Assessment & Plan:   1.  History of right trimalleolar fracture status post ankle fusion.  She has continued mobility issues related to her fracture.  Additional comorbidities of morbid obesity as well as Chiari malformation with history of hydrocephalus and balance problems. We discussed from a mobility standpoint she will require a scooter.  I do not see her becoming more ambulatory than she is.  She should have a scooter that allows her to be modified independent in the community setting. In regards to a walk-in bath, she does have a history of seizures and should avoid being submerged in water.  Continue Lyrica 100 mg 3 times daily Continue Robaxin 500 mg p.o. nightly as needed

## 2021-06-13 ENCOUNTER — Telehealth: Payer: Self-pay

## 2021-06-13 NOTE — Telephone Encounter (Signed)
Pregabalin 100 mg #90 per 30 days does not exceed the member's formulary QL limits.

## 2021-06-20 ENCOUNTER — Telehealth: Payer: Self-pay | Admitting: *Deleted

## 2021-06-20 NOTE — Telephone Encounter (Signed)
[name] called and is asking for a "copy of Rx for OT eval be mailed to her".  I do not see that an OT  order has been placed.  Please advise.

## 2021-06-24 NOTE — Telephone Encounter (Signed)
Per Dr Kirsteins:"This is a workers comp issue.  I think she probably just needs this for her attorney.  I can simply just write down on a prescription pad OT evaluation and maybe fax it to wherever she needs it.  I think Worker's Comp. has already denied OT eval "  I have written on Rx for Dr Wynn Banker to sign and will be mailed to patient.

## 2021-09-13 ENCOUNTER — Other Ambulatory Visit: Payer: Self-pay

## 2021-09-13 ENCOUNTER — Encounter
Payer: No Typology Code available for payment source | Attending: Physical Medicine & Rehabilitation | Admitting: Physical Medicine & Rehabilitation

## 2021-09-13 ENCOUNTER — Encounter: Payer: Self-pay | Admitting: Physical Medicine & Rehabilitation

## 2021-09-13 VITALS — BP 133/79 | HR 85 | Temp 97.6°F

## 2021-09-13 DIAGNOSIS — M19171 Post-traumatic osteoarthritis, right ankle and foot: Secondary | ICD-10-CM | POA: Diagnosis not present

## 2021-09-13 MED ORDER — PREGABALIN 100 MG PO CAPS: 100.0000 mg | ORAL_CAPSULE | Freq: Three times a day (TID) | ORAL | 5 refills | Status: DC

## 2021-09-13 MED ORDER — METHOCARBAMOL 500 MG PO TABS: 500.0000 mg | ORAL_TABLET | Freq: Every evening | ORAL | 5 refills | Status: DC | PRN

## 2021-09-13 NOTE — Progress Notes (Signed)
Subjective:    Patient ID: [name], female    DOB: August 25, 1971, 50 y.o.   MRN: [id number] 50 year old female with history of Chiari malformation, morbid obesity, who fell suffering trimalleolar fracture at work back in 2019.  She had a right ankle fusion performed by Dr. Toni Arthurs in 2020 She has history of hydrocephalus and has had a VP shunt placed due to her Chiari malformation.   At one point she was walking 30 feet.  She was nonambulatory when she first came to this clinic 04/03/2020. Now able to get on and off toilet.  Unfortunately the patient was infected with COVID over Christmas.  After quarantining, she return to physical therapy but has noted elevated heart rate during ambulation up to 140 bpm.    She has no breathing problems at rest.   HPI Still needs assist for dressing and bathing Has been doing sponge baths has not made any bathroom occasions for walk-in shower.   Pt states unable to sleep in a bed for 3 -4 years due to ankle injury at work, trimalleolar fracture on RIght side with chronic pain. Patient complains of left lower extremity swelling she is wearing CircAid.  She is not wearing her foot compression dressing today he only wears this when she is in the house. She notes some discoloration sometimes purplish sometimes pinkish in the right foot.  She does not have any hypersensitivity to touch. Pain Inventory Average Pain 4 Pain Right Now 6 My pain is dull, stabbing, and aching  In the last 24 hours, has pain interfered with the following? General activity 9 Relation with others 0 Enjoyment of life 10 What TIME of day is your pain at its worst? varies Sleep (in general) Fair  Pain is worse with: walking, standing, and some activites Pain improves with: medication Relief from Meds: 6  Family History  Problem Relation Age of Onset   Non-Hodgkin's lymphoma Mother    Hypertension Mother    Hypertension Father    Social History   Socioeconomic  History   Marital status: Married    Spouse name: Not on file   Number of children: Not on file   Years of education: Not on file   Highest education level: Not on file  Occupational History   Not on file  Tobacco Use   Smoking status: Never   Smokeless tobacco: Never  Vaping Use   Vaping Use: Never used  Substance and Sexual Activity   Alcohol use: Never   Drug use: Never   Sexual activity: Not on file  Other Topics Concern   Not on file  Social History Narrative   Not on file   Social Determinants of Health   Financial Resource Strain: Not on file  Food Insecurity: Not on file  Transportation Needs: Not on file  Physical Activity: Not on file  Stress: Not on file  Social Connections: Not on file   Past Surgical History:  Procedure Laterality Date   ANKLE ARTHROSCOPY Left    related to Tendon   ANKLE FUSION Right 12/23/2018   Procedure: right tibiotalar calcaneal arthrodesis;  Surgeon: Toni Arthurs, MD;  Location: [facility] OR;  Service: Orthopedics;  Laterality: Right;    ANKLE SURGERY Right 12/23/2018   BACK SURGERY  1971   Spinal Bifida   BACK SURGERY  2012   fusion   EXTERNAL FIXATION LEG Right 05/18/2018   Procedure: EXTERNAL FIXATION LEG;  Surgeon: Yolonda Kida, MD;  Location: [facility] OR;  Service: Orthopedics;  Laterality: Right;   EXTERNAL FIXATION REMOVAL Right 06/14/2018   Procedure: Right ankle external fixator removal with steinman pin placement;  Surgeon: Yolonda Kida, MD;  Location: [facility] OR;  Service: Orthopedics;  Laterality: Right;  60 mins   HARDWARE REMOVAL Right 05/18/2018   Procedure: HARDWARE REMOVAL;  Surgeon: Yolonda Kida, MD;  Location: [facility] OR;  Service: Orthopedics;  Laterality: Right;   HARDWARE REMOVAL Right 12/23/2018   Procedure: RIGHT ANKLE REMOVAL OF DEEP IMPLANTS;  Surgeon: Toni Arthurs, MD;  Location: [facility] OR;  Service: Orthopedics;  Laterality: Right;   KNEE ARTHROSCOPY Left 1983   ORIF ANKLE FRACTURE Right 04/23/2018    Procedure: OPEN REDUCTION INTERNAL FIXATION (ORIF) TRIMALLEOLAR ANKLE FRACTURE;  Surgeon: Yolonda Kida, MD;  Location: [facility] ORS;  Service: Orthopedics;  Laterality: Right;  120 mins   ORIF ANKLE FRACTURE Right 05/18/2018   Procedure: Right ankle open reduction internal fixation revision;  Surgeon: Yolonda Kida, MD;  Location: [facility] OR;  Service: Orthopedics;  Laterality: Right;  120 mins   SHUNT EXTERNALIZATION     Head x 2   TIBIA OSTEOTOMY Right 12/23/2018   Procedure: RIGHT MEDIAL MALLEOLUS OSTEOTOMY;  Surgeon: Toni Arthurs, MD;  Location: [facility] OR;  Service: Orthopedics;  Laterality: Right;   WISDOM TOOTH EXTRACTION     Past Surgical History:  Procedure Laterality Date   ANKLE ARTHROSCOPY Left    related to Tendon   ANKLE FUSION Right 12/23/2018   Procedure: right tibiotalar calcaneal arthrodesis;  Surgeon: Toni Arthurs, MD;  Location: [facility] OR;  Service: Orthopedics;  Laterality: Right;    ANKLE SURGERY Right 12/23/2018   BACK SURGERY  1971   Spinal Bifida   BACK SURGERY  2012   fusion   EXTERNAL FIXATION LEG Right 05/18/2018   Procedure: EXTERNAL FIXATION LEG;  Surgeon: Yolonda Kida, MD;  Location: [facility] OR;  Service: Orthopedics;  Laterality: Right;   EXTERNAL FIXATION REMOVAL Right 06/14/2018   Procedure: Right ankle external fixator removal with steinman pin placement;  Surgeon: Yolonda Kida, MD;  Location: [facility] OR;  Service: Orthopedics;  Laterality: Right;  60 mins   HARDWARE REMOVAL Right 05/18/2018   Procedure: HARDWARE REMOVAL;  Surgeon: Yolonda Kida, MD;  Location: [facility] OR;  Service: Orthopedics;  Laterality: Right;   HARDWARE REMOVAL Right 12/23/2018   Procedure: RIGHT ANKLE REMOVAL OF DEEP IMPLANTS;  Surgeon: Toni Arthurs, MD;  Location: [facility] OR;  Service: Orthopedics;  Laterality: Right;   KNEE ARTHROSCOPY Left 1983   ORIF ANKLE FRACTURE Right 04/23/2018   Procedure: OPEN REDUCTION INTERNAL FIXATION (ORIF) TRIMALLEOLAR ANKLE FRACTURE;  Surgeon:  Yolonda Kida, MD;  Location: [facility] ORS;  Service: Orthopedics;  Laterality: Right;  120 mins   ORIF ANKLE FRACTURE Right 05/18/2018   Procedure: Right ankle open reduction internal fixation revision;  Surgeon: Yolonda Kida, MD;  Location: [facility] OR;  Service: Orthopedics;  Laterality: Right;  120 mins   SHUNT EXTERNALIZATION     Head x 2   TIBIA OSTEOTOMY Right 12/23/2018   Procedure: RIGHT MEDIAL MALLEOLUS OSTEOTOMY;  Surgeon: Toni Arthurs, MD;  Location: [facility] OR;  Service: Orthopedics;  Laterality: Right;   WISDOM TOOTH EXTRACTION     Past Medical History:  Diagnosis Date   Chiari malformation type II (HCC)    Family history of adverse reaction to anesthesia    grandfather died from anesthesia; grandfather had malignant hyperthermia   GERD (gastroesophageal reflux disease)    Hydrocephalus (HCC)  Malignant hyperthermia    Neuromuscular disorder (HCC)    Neuropathy   Osteoarthritis    PONV (postoperative nausea and vomiting)    Spina bifida (HCC)    BP 133/79   Pulse 85   Temp 97.6 F (36.4 C) (Oral)   SpO2 97%   Opioid Risk Score:   Fall Risk Score:  `1  Depression screen PHQ 2/9  Depression screen [facility] 2/9 06/06/2021 10/19/2020 09/07/2020 04/03/2020  Decreased Interest 1 1 1 2   Down, Depressed, Hopeless 1 1 1 2   PHQ - 2 Score 2 2 2 4   Altered sleeping - - - 2  Tired, decreased energy - - - 2  Change in appetite - - - 2  Feeling bad or failure about yourself  - - - 2  Trouble concentrating - - - 2  Moving slowly or fidgety/restless - - - 0  Suicidal thoughts - - - 0  PHQ-9 Score - - - 14  Difficult doing work/chores - - - Extremely dIfficult     Review of Systems  Musculoskeletal:        Right foot      Objective:   Physical Exam Vitals and nursing note reviewed.  Constitutional:      Appearance: Normal appearance. She is obese.  HENT:     Head: Normocephalic and atraumatic.  Eyes:     Extraocular Movements: Extraocular movements intact.     Pupils:  Pupils are equal, round, and reactive to light.  Musculoskeletal:     Comments: 3+ edema right foot and ankle area skin warm and dry no dystrophic changes of the nails no hyperhidrosis or hyperhidrosis.  There is no tenderness to light palpation there is tenderness to the deep palpation  Neurological:     Mental Status: She is alert and oriented to person, place, and time.     Comments: He cannot do ankle strength testing secondary to ankle fusion on the right side.  Knee extension is 4/5  Sensation reduced in the tibial nerve distribution as well as the sural nerve distribution on the right side only.  Did not take ambulation since patient did not have walker Patient sitting in scooter  Psychiatric:        Mood and Affect: Mood normal.        Behavior: Behavior normal.          Assessment & Plan:   1.  Right chronic ankle and foot pain following, right tri malleolar fracture.  Discussed with patient she does have evidence of nerve injury affecting the dorsum of the foot as well as toes on the right side. Distribution consistent with distal sciatic nerve, possible  complex regional pain syndrome, no definite vasomotor changes. She has chronic mobility issues related to this which also impacts her activities of daily living. Recommend OT eval for additional equipment recs Have ordered hospital bed Ordered new scooter Continue Lyrica 100 mg 3 times daily Continue methocarbamol 500 mg nightly Physical medicine rehab follow-up in 6 months

## 2021-09-13 NOTE — Patient Instructions (Addendum)
Voltaren gel 3 x per day   Ordered the hospital bed Ordered OT Continue Lyrica 100 mg 3 times a day Continue methocarbamol 500 mg nightly as needed or

## 2021-11-04 ENCOUNTER — Telehealth: Payer: Self-pay

## 2021-11-04 NOTE — Telephone Encounter (Signed)
Attempted PA for Pregabalin. 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

## 2021-11-13 ENCOUNTER — Other Ambulatory Visit: Payer: Self-pay | Admitting: Neurological Surgery

## 2021-11-13 DIAGNOSIS — G919 Hydrocephalus, unspecified: Secondary | ICD-10-CM

## 2021-12-11 ENCOUNTER — Ambulatory Visit
Admission: RE | Admit: 2021-12-11 | Discharge: 2021-12-11 | Disposition: A | Discharge status: Home / Self Care | Payer: Medicare Other | Source: Ambulatory Visit | Attending: Neurological Surgery | Admitting: Neurological Surgery

## 2021-12-11 ENCOUNTER — Other Ambulatory Visit: Payer: Self-pay

## 2021-12-11 DIAGNOSIS — G919 Hydrocephalus, unspecified: Secondary | ICD-10-CM

## 2021-12-17 ENCOUNTER — Telehealth: Payer: Self-pay

## 2021-12-17 NOTE — Telephone Encounter (Signed)
Prior authorization request  for Pregabalin 100 MG faxed to:   [facility] Andris Flurry RN [facility] [phone] FAX [phone] CELL [phone]

## 2022-01-23 ENCOUNTER — Telehealth: Payer: Self-pay

## 2022-01-23 NOTE — Telephone Encounter (Signed)
PA for Pregabalin faxed to Case Manager Linda Swaim, RN  [phone] (cell) [phone] (office) [phone] (fax) 

## 2022-03-05 ENCOUNTER — Encounter: Payer: Self-pay | Admitting: Physical Medicine & Rehabilitation

## 2022-03-14 ENCOUNTER — Encounter: Payer: Self-pay | Admitting: Physical Medicine & Rehabilitation

## 2022-03-14 ENCOUNTER — Encounter
Payer: No Typology Code available for payment source | Attending: Physical Medicine & Rehabilitation | Admitting: Physical Medicine & Rehabilitation

## 2022-03-14 VITALS — BP 142/110 | HR 89 | Ht 60.0 in

## 2022-03-14 DIAGNOSIS — M25571 Pain in right ankle and joints of right foot: Secondary | ICD-10-CM | POA: Insufficient documentation

## 2022-03-14 DIAGNOSIS — I890 Lymphedema, not elsewhere classified: Secondary | ICD-10-CM | POA: Insufficient documentation

## 2022-03-14 MED ORDER — PREGABALIN 100 MG PO CAPS: 100.0000 mg | ORAL_CAPSULE | Freq: Three times a day (TID) | ORAL | 5 refills | Status: DC

## 2022-03-14 NOTE — Patient Instructions (Signed)
Lymphedema ? ?Lymphedema is swelling that is caused by the abnormal collection of lymph in the tissues under the skin. Lymph is excess fluid from the tissues in your body that is removed through the lymphatic system. This system is part of your body's defense system (immune system) and includes lymph nodes and lymph vessels. The lymph vessels collect and carry the excess fluid, fats, proteins, and waste from the tissues of the body to the bloodstream. This system also works to clean and remove bacteria and waste products from the body. ?Lymphedema occurs when the lymphatic system is blocked. When the lymph vessels or lymph nodes are blocked or damaged, lymph does not drain properly. This causes an abnormal buildup of lymph, which leads to swelling in the affected area. This may include the trunk area, or an arm or leg. Lymphedema cannot be cured by medicines, but various methods can be used to help reduce the swelling. ?What are the causes? ?The cause of this condition depends on the type of lymphedema that you have. ?Primary lymphedema is caused by the absence of lymph vessels or having abnormal lymph vessels at birth. ?Secondary lymphedema occurs when lymph vessels are blocked or damaged. Secondary lymphedema is more common. Common causes of lymph vessel blockage include: ?Skin infection, such as cellulitis. ?Infection by parasites (filariasis). ?Injury. ?Radiation therapy. ?Cancer. ?Formation of scar tissue. ?Surgery. ?What are the signs or symptoms? ?Symptoms of this condition include: ?Swelling of the arm or leg. ?A heavy or tight feeling in the arm or leg. ?Swelling of the feet, toes, or fingers. Shoes or rings may fit more tightly than before. ?Redness of the skin over the affected area. ?Limited movement of the affected limb. ?Sensitivity to touch or discomfort in the affected limb. ?How is this diagnosed? ?This condition may be diagnosed based on: ?Your symptoms and medical history. ?A physical  exam. ?Bioimpedance spectroscopy. In this test, painless electrical currents are used to measure fluid levels in your body. ?Imaging tests, such as: ?MRI. ?CT scan. ?Duplex ultrasound. This test uses sound waves to produce images of the vessels and the blood flow on a screen. ?Lymphoscintigraphy. In this test, a low dose of a radioactive substance is injected to trace the flow of lymph through your lymph vessels. ?Lymphangiography. In this test, a contrast dye is injected into the lymph vessel to help show blockages. ?How is this treated? ? ?If an underlying condition is causing the lymphedema, that condition will be treated. For example, antibiotic medicines may be used to treat an infection. ?Treatment for this condition will depend on the cause of your lymphedema. Treatment may include: ?Complete decongestive therapy (CDT). This is done by a certified lymphedema therapist to reduce fluid congestion. This therapy includes: ?Skin care. ?Compression wrapping of the affected area. ?Manual lymph drainage. This is a special massage technique that promotes lymph drainage out of a limb. ?Specific exercises. Certain exercises can help fluid move out of the affected limb. ?Compression. Various methods may be used to apply pressure to the affected limb to reduce the swelling. They include: ?Wearing compression stockings or sleeves on the affected limb. ?Wrapping the affected limb with special bandages. ?Surgery. This is usually done for severe cases only. For example, surgery may be done if you have trouble moving the limb or if the swelling does not get better with other treatments. ?Follow these instructions at home: ?Self-care ?The affected area is more likely to become injured or infected. Take these steps to help prevent infection: ?Keep the affected   area clean and dry. ?Use approved creams or lotions to keep the skin moisturized. ?Protect your skin from cuts: ?Use gloves while cooking or gardening. ?Do not walk  barefoot. ?If you shave the affected area, use an electric razor. ?Do not wear tight clothes, shoes, or jewelry. ?Eat a healthy diet that includes a lot of fruits and vegetables. ?Activity ?Do exercises as told by your health care provider. ?Do not sit with your legs crossed. ?When possible, keep the affected limb raised (elevated) above the level of your heart. ?Avoid carrying things with an arm that is affected by lymphedema. ?General instructions ?Wear compression stockings or sleeves as told by your health care provider. ?Note any changes in size of the affected limb. You may be instructed to take regular measurements and keep track of them. ?Take over-the-counter and prescription medicines only as told by your health care provider. ?If you were prescribed an antibiotic medicine, take or apply it as told by your health care provider. Do not stop using the antibiotic even if you start to feel better or if your condition improves. ?Do not use heating pads or ice packs on the affected area. ?Avoid having blood draws, IV insertions, or blood pressure checks on the affected limb. ?Keep all follow-up visits. This is important. ?Contact a health care provider if you: ?Continue to have swelling in your limb. ?Have fluid leaking from the skin of your swollen limb. ?Have a cut that does not heal. ?Have redness or pain in the affected area. ?Develop purplish spots, rash, blisters, or sores (lesions) on your affected limb. ?Get help right away if you: ?Have new swelling in your limb that starts suddenly. ?Have shortness of breath or chest pain. ?Have a fever or chills. ?These symptoms may represent a serious problem that is an emergency. Do not wait to see if the symptoms will go away. Get medical help right away. Call your local emergency services (911 in the [place] Do not drive yourself to the hospital. ?Summary ?Lymphedema is swelling that is caused by the abnormal collection of lymph in the tissues under the  skin. ?Lymph is fluid from the tissues in your body that is removed through the lymphatic system. This system collects and carries excess fluid, fats, proteins, and wastes from the tissues of the body to the bloodstream. ?Lymphedema causes swelling, pain, and redness in the affected area. This may include the trunk area, or an arm or leg. ?Treatment for this condition may depend on the cause of your lymphedema. Treatment may include treating the underlying cause, complete decongestive therapy (CDT), compression methods, or surgery. ?This information is not intended to replace advice given to you by your health care provider. Make sure you discuss any questions you have with your health care provider. ?Document Revised: 09/12/2020 Document Reviewed: 09/12/2020 ?Elsevier Patient Education ? 2023 [employer] ? ?

## 2022-03-14 NOTE — Progress Notes (Signed)
? ?Subjective:  ? ? Patient ID: [name], female    DOB: 1971-01-14, 51 y.o.   MRN: [id number] ? ?HPI ?51 year-old female with history of spina bifida and type II Chiari malformation referred originally by Dr. Victorino Dike for right foot and ankle pain.  Work-related injury employed at [employer] 04/12/2018. ?Fall with try malleolus fracture.  Was followed at the [facility] health inpatient rehab's unit, Dr. Arlyn Dunning.  Nonweightbearing.  Admitted at a mod assist level discharged at a supervision level transfers.  Received home health care but had difficulty with access to her bathroom because of the scooter.  She did not maintain that level of functioning.  She has several surgeries subsequent to that time with hardware removal placement of external fixator 05/18/2018 removal of external fixator 06/15/2019 and on 112/27/2020 right ankle tibiotalar calcaneal arthrodesis by Dr. Victorino Dike.  Patient states that has had numbness below the knee since her external fixator patient placement.  She has also had chronic swelling in the right lower extremity.  Her neurogenic pain has been relatively well managed by pregabalin 100 mg 3 times daily.  She has main complaint today of increasing right foot pain as well as increasing right lower extremity pain.  She did fall and injure her foot a few weeks ago initially there was bruising and increased pain.  The pain has subsided as has the bruising.  She still has increased swelling. ?She never saw a doctor for this and has not had any x-rays. ?Pain Inventory ?Average Pain 4 ?Pain Right Now 3 ?My pain is sharp ? ?In the last 24 hours, has pain interfered with the following? ?General activity 10 ?Relation with others 2 ?Enjoyment of life 10 ?What TIME of day is your pain at its worst? evening and night ?Sleep (in general) Fair ? ?Pain is worse with: walking, bending, standing, and some activites ?Pain improves with: medication ?Relief from Meds: 5 ? ?Family History  ?Problem Relation Age of  Onset  ? Non-Hodgkin's lymphoma Mother   ? Hypertension Mother   ? Hypertension Father   ? ?Social History  ? ?Socioeconomic History  ? Marital status: Married  ?  Spouse name: Not on file  ? Number of children: Not on file  ? Years of education: Not on file  ? Highest education level: Not on file  ?Occupational History  ? Not on file  ?Tobacco Use  ? Smoking status: Never  ? Smokeless tobacco: Never  ?Vaping Use  ? Vaping Use: Never used  ?Substance and Sexual Activity  ? Alcohol use: Never  ? Drug use: Never  ? Sexual activity: Not on file  ?Other Topics Concern  ? Not on file  ?Social History Narrative  ? Not on file  ? ?Social Determinants of Health  ? ?Financial Resource Strain: Not on file  ?Food Insecurity: Not on file  ?Transportation Needs: Not on file  ?Physical Activity: Not on file  ?Stress: Not on file  ?Social Connections: Not on file  ? ?Past Surgical History:  ?Procedure Laterality Date  ? ANKLE ARTHROSCOPY Left   ? related to Tendon  ? ANKLE FUSION Right 12/23/2018  ? Procedure: right tibiotalar calcaneal arthrodesis;  Surgeon: Toni Arthurs, MD;  Location: [facility] OR;  Service: Orthopedics;  Laterality: Right;   ? ANKLE SURGERY Right 12/23/2018  ? BACK SURGERY  1971  ? Spinal Bifida  ? BACK SURGERY  2012  ? fusion  ? EXTERNAL FIXATION LEG Right 05/18/2018  ? Procedure: EXTERNAL FIXATION  LEG;  Surgeon: Yolonda Kida, MD;  Location: [facility] OR;  Service: Orthopedics;  Laterality: Right;  ? EXTERNAL FIXATION REMOVAL Right 06/14/2018  ? Procedure: Right ankle external fixator removal with steinman pin placement;  Surgeon: Yolonda Kida, MD;  Location: [facility] OR;  Service: Orthopedics;  Laterality: Right;  60 mins  ? HARDWARE REMOVAL Right 05/18/2018  ? Procedure: HARDWARE REMOVAL;  Surgeon: Yolonda Kida, MD;  Location: [facility] OR;  Service: Orthopedics;  Laterality: Right;  ? HARDWARE REMOVAL Right 12/23/2018  ? Procedure: RIGHT ANKLE REMOVAL OF DEEP IMPLANTS;  Surgeon: Toni Arthurs, MD;   Location: [facility] OR;  Service: Orthopedics;  Laterality: Right;  ? KNEE ARTHROSCOPY Left 1983  ? ORIF ANKLE FRACTURE Right 04/23/2018  ? Procedure: OPEN REDUCTION INTERNAL FIXATION (ORIF) TRIMALLEOLAR ANKLE FRACTURE;  Surgeon: Yolonda Kida, MD;  Location: [facility] ORS;  Service: Orthopedics;  Laterality: Right;  120 mins  ? ORIF ANKLE FRACTURE Right 05/18/2018  ? Procedure: Right ankle open reduction internal fixation revision;  Surgeon: Yolonda Kida, MD;  Location: [facility] OR;  Service: Orthopedics;  Laterality: Right;  120 mins  ? SHUNT EXTERNALIZATION    ? Head x 2  ? TIBIA OSTEOTOMY Right 12/23/2018  ? Procedure: RIGHT MEDIAL MALLEOLUS OSTEOTOMY;  Surgeon: Toni Arthurs, MD;  Location: [facility] OR;  Service: Orthopedics;  Laterality: Right;  ? WISDOM TOOTH EXTRACTION    ? ?Past Surgical History:  ?Procedure Laterality Date  ? ANKLE ARTHROSCOPY Left   ? related to Tendon  ? ANKLE FUSION Right 12/23/2018  ? Procedure: right tibiotalar calcaneal arthrodesis;  Surgeon: Toni Arthurs, MD;  Location: [facility] OR;  Service: Orthopedics;  Laterality: Right;   ? ANKLE SURGERY Right 12/23/2018  ? BACK SURGERY  1971  ? Spinal Bifida  ? BACK SURGERY  2012  ? fusion  ? EXTERNAL FIXATION LEG Right 05/18/2018  ? Procedure: EXTERNAL FIXATION LEG;  Surgeon: Yolonda Kida, MD;  Location: [facility] OR;  Service: Orthopedics;  Laterality: Right;  ? EXTERNAL FIXATION REMOVAL Right 06/14/2018  ? Procedure: Right ankle external fixator removal with steinman pin placement;  Surgeon: Yolonda Kida, MD;  Location: [facility] OR;  Service: Orthopedics;  Laterality: Right;  60 mins  ? HARDWARE REMOVAL Right 05/18/2018  ? Procedure: HARDWARE REMOVAL;  Surgeon: Yolonda Kida, MD;  Location: [facility] OR;  Service: Orthopedics;  Laterality: Right;  ? HARDWARE REMOVAL Right 12/23/2018  ? Procedure: RIGHT ANKLE REMOVAL OF DEEP IMPLANTS;  Surgeon: Toni Arthurs, MD;  Location: [facility] OR;  Service: Orthopedics;  Laterality: Right;  ? KNEE ARTHROSCOPY Left 1983   ? ORIF ANKLE FRACTURE Right 04/23/2018  ? Procedure: OPEN REDUCTION INTERNAL FIXATION (ORIF) TRIMALLEOLAR ANKLE FRACTURE;  Surgeon: Yolonda Kida, MD;  Location: [facility];  Service: Orthopedics;  Laterality: Right;  120 mins  ? ORIF ANKLE FRACTURE Right 05/18/2018  ? Procedure: Right ankle open reduction internal fixation revision;  Surgeon: Yolonda Kida, MD;  Location: [facility] OR;  Service: Orthopedics;  Laterality: Right;  120 mins  ? SHUNT EXTERNALIZATION    ? Head x 2  ? TIBIA OSTEOTOMY Right 12/23/2018  ? Procedure: RIGHT MEDIAL MALLEOLUS OSTEOTOMY;  Surgeon: Toni Arthurs, MD;  Location: [facility] OR;  Service: Orthopedics;  Laterality: Right;  ? WISDOM TOOTH EXTRACTION    ? ?Past Medical History:  ?Diagnosis Date  ? Chiari malformation type II (HCC)   ? Family history of adverse reaction to anesthesia   ? grandfather died from anesthesia; grandfather had malignant hyperthermia  ?  GERD (gastroesophageal reflux disease)   ? Hydrocephalus (HCC)   ? Malignant hyperthermia   ? Neuromuscular disorder (HCC)   ? Neuropathy  ? Osteoarthritis   ? PONV (postoperative nausea and vomiting)   ? Spina bifida (HCC)   ? ?BP (!) 142/110   Pulse 89   Ht 5' (1.524 m)   SpO2 93%   BMI 60.74 kg/m?  ? ?Opioid Risk Score:   ?Fall Risk Score:  `1 ? ?Depression screen PHQ 2/9 ? ? ?  03/14/2022  ?  3:35 PM 06/06/2021  ?  3:24 PM 10/19/2020  ?  2:55 PM 09/07/2020  ?  3:42 PM 04/03/2020  ?  2:39 PM  ?Depression screen PHQ 2/9  ?Decreased Interest 0 1 1 1 2   ?Down, Depressed, Hopeless 0 1 1 1 2   ?PHQ - 2 Score 0 2 2 2 4   ?Altered sleeping     2  ?Tired, decreased energy     2  ?Change in appetite     2  ?Feeling bad or failure about yourself      2  ?Trouble concentrating     2  ?Moving slowly or fidgety/restless     0  ?Suicidal thoughts     0  ?PHQ-9 Score     14  ?Difficult doing work/chores     Extremely dIfficult  ?  ? ?Review of Systems  ?Constitutional: Negative.   ?HENT: Negative.    ?Eyes: Negative.   ?Respiratory: Negative.     ?Cardiovascular: Negative.   ?Gastrointestinal: Negative.   ?Endocrine: Negative.   ?Genitourinary: Negative.   ?Musculoskeletal:  Positive for gait problem.  ?Skin: Negative.   ?Allergic/Immunologic: Negative.

## 2022-05-21 ENCOUNTER — Telehealth: Payer: Self-pay | Admitting: *Deleted

## 2022-05-21 NOTE — Telephone Encounter (Signed)
PA for Pregabalin faxed to Case Manager Andris Flurry, RN  [phone] (cell) [phone] (office) [phone] (fax)

## 2022-06-13 ENCOUNTER — Encounter: Payer: No Typology Code available for payment source | Admitting: Physical Medicine & Rehabilitation

## 2022-07-18 ENCOUNTER — Encounter: Payer: No Typology Code available for payment source | Admitting: Physical Medicine & Rehabilitation

## 2022-08-21 ENCOUNTER — Encounter: Payer: Self-pay | Admitting: Physical Medicine & Rehabilitation

## 2022-08-21 ENCOUNTER — Encounter
Payer: No Typology Code available for payment source | Attending: Physical Medicine & Rehabilitation | Admitting: Physical Medicine & Rehabilitation

## 2022-08-21 VITALS — BP 126/77 | HR 76 | Ht 60.0 in | Wt 323.0 lb

## 2022-08-21 DIAGNOSIS — G8929 Other chronic pain: Secondary | ICD-10-CM | POA: Insufficient documentation

## 2022-08-21 DIAGNOSIS — M25571 Pain in right ankle and joints of right foot: Secondary | ICD-10-CM | POA: Insufficient documentation

## 2022-08-21 DIAGNOSIS — I890 Lymphedema, not elsewhere classified: Secondary | ICD-10-CM | POA: Insufficient documentation

## 2022-08-21 MED ORDER — PREGABALIN 100 MG PO CAPS: 100.0000 mg | ORAL_CAPSULE | Freq: Three times a day (TID) | ORAL | 5 refills | Status: DC

## 2022-08-21 NOTE — Progress Notes (Signed)
Subjective:    Patient ID: [name], female    DOB: Apr 02, 1971, 51 y.o.   MRN: [id number] 51 year-old female with history of spina bifida and type II Chiari malformation referred originally by Dr. Doran Durand for right foot and ankle pain.  Work-related injury employed at [employer] 04/12/2018. Fall with try malleolus fracture.  Was followed at the [facility] health inpatient rehab's unit, Dr. Susa Griffins.  Nonweightbearing.  Admitted at a mod assist level discharged at a supervision level transfers.  Received home health care but had difficulty with access to her bathroom because of the scooter.  She did not maintain that level of functioning.  She has several surgeries subsequent to that time with hardware removal placement of external fixator 05/18/2018 removal of external fixator 06/15/2019 and on 112/27/2020 right ankle tibiotalar calcaneal arthrodesis by Dr. Doran Durand.  Patient states that has had numbness below the knee since her external fixator patient placement.  She has also had chronic swelling in the right lower extremity.  Her neurogenic pain has been relatively well managed by pregabalin 100 mg 3 times daily.  She has main complaint today of increasing right foot pain as well as increasing right lower extremity pain.  She did fall and injure her foot a few weeks ago initially there was bruising and increased pain.  The pain has subsided as has the bruising.  She still has increased swelling. She never saw a doctor for this and has not had any x-rays. HPI  RIght leg and shin pain , feels like it will give way  Has had a couple falls at home but denies any injury which required MD visit   The patient has not had any bruising or new skin areas. She continues to have severe swelling in the right lower extremity she is unable to wear shoes on the right foot she wears a croc but its only covering her toes.  She is able to don her left croc without difficulty.  Pain Inventory Average Pain 4 Pain  Right Now 3 My pain is dull, stabbing, tingling, and aching  In the last 24 hours, has pain interfered with the following? General activity 10 Relation with others 3 Enjoyment of life 8 What TIME of day is your pain at its worst? daytime and evening Sleep (in general) Fair  Pain is worse with: standing Pain improves with: medication Relief from Meds: 6  Family History  Problem Relation Age of Onset   Non-Hodgkin's lymphoma Mother    Hypertension Mother    Hypertension Father    Social History   Socioeconomic History   Marital status: Married    Spouse name: Not on file   Number of children: Not on file   Years of education: Not on file   Highest education level: Not on file  Occupational History   Not on file  Tobacco Use   Smoking status: Never   Smokeless tobacco: Never  Vaping Use   Vaping Use: Never used  Substance and Sexual Activity   Alcohol use: Never   Drug use: Never   Sexual activity: Not on file  Other Topics Concern   Not on file  Social History Narrative   Not on file   Social Determinants of Health   Financial Resource Strain: Not on file  Food Insecurity: Not on file  Transportation Needs: Not on file  Physical Activity: Not on file  Stress: Not on file  Social Connections: Not on file   Past Surgical  History:  Procedure Laterality Date   ANKLE ARTHROSCOPY Left    related to Tendon   ANKLE FUSION Right 12/23/2018   Procedure: right tibiotalar calcaneal arthrodesis;  Surgeon: Wylene Simmer, MD;  Location: [place];  Service: Orthopedics;  Laterality: Right;  177min   ANKLE SURGERY Right 12/23/2018   BACK SURGERY  1971   Spinal Bifida   BACK SURGERY  2012   fusion   EXTERNAL FIXATION LEG Right 05/18/2018   Procedure: EXTERNAL FIXATION LEG;  Surgeon: Nicholes Stairs, MD;  Location: [place];  Service: Orthopedics;  Laterality: Right;   EXTERNAL FIXATION REMOVAL Right 06/14/2018   Procedure: Right ankle external fixator removal with steinman  pin placement;  Surgeon: Nicholes Stairs, MD;  Location: [place];  Service: Orthopedics;  Laterality: Right;  60 mins   HARDWARE REMOVAL Right 05/18/2018   Procedure: HARDWARE REMOVAL;  Surgeon: Nicholes Stairs, MD;  Location: [place];  Service: Orthopedics;  Laterality: Right;   HARDWARE REMOVAL Right 12/23/2018   Procedure: RIGHT ANKLE REMOVAL OF DEEP IMPLANTS;  Surgeon: Wylene Simmer, MD;  Location: [place];  Service: Orthopedics;  Laterality: Right;   KNEE ARTHROSCOPY Left 1983   ORIF ANKLE FRACTURE Right 04/23/2018   Procedure: OPEN REDUCTION INTERNAL FIXATION (ORIF) TRIMALLEOLAR ANKLE FRACTURE;  Surgeon: Nicholes Stairs, MD;  Location: [facility];  Service: Orthopedics;  Laterality: Right;  120 mins   ORIF ANKLE FRACTURE Right 05/18/2018   Procedure: Right ankle open reduction internal fixation revision;  Surgeon: Nicholes Stairs, MD;  Location: [place];  Service: Orthopedics;  Laterality: Right;  120 mins   SHUNT EXTERNALIZATION     Head x 2   TIBIA OSTEOTOMY Right 12/23/2018   Procedure: RIGHT MEDIAL MALLEOLUS OSTEOTOMY;  Surgeon: Wylene Simmer, MD;  Location: [place];  Service: Orthopedics;  Laterality: Right;   WISDOM TOOTH EXTRACTION     Past Surgical History:  Procedure Laterality Date   ANKLE ARTHROSCOPY Left    related to Tendon   ANKLE FUSION Right 12/23/2018   Procedure: right tibiotalar calcaneal arthrodesis;  Surgeon: Wylene Simmer, MD;  Location: [place];  Service: Orthopedics;  Laterality: Right;  171min   ANKLE SURGERY Right 12/23/2018   BACK SURGERY  1971   Spinal Bifida   BACK SURGERY  2012   fusion   EXTERNAL FIXATION LEG Right 05/18/2018   Procedure: EXTERNAL FIXATION LEG;  Surgeon: Nicholes Stairs, MD;  Location: [place];  Service: Orthopedics;  Laterality: Right;   EXTERNAL FIXATION REMOVAL Right 06/14/2018   Procedure: Right ankle external fixator removal with steinman pin placement;  Surgeon: Nicholes Stairs, MD;  Location: [place];  Service:  Orthopedics;  Laterality: Right;  60 mins   HARDWARE REMOVAL Right 05/18/2018   Procedure: HARDWARE REMOVAL;  Surgeon: Nicholes Stairs, MD;  Location: [place];  Service: Orthopedics;  Laterality: Right;   HARDWARE REMOVAL Right 12/23/2018   Procedure: RIGHT ANKLE REMOVAL OF DEEP IMPLANTS;  Surgeon: Wylene Simmer, MD;  Location: [place];  Service: Orthopedics;  Laterality: Right;   KNEE ARTHROSCOPY Left 1983   ORIF ANKLE FRACTURE Right 04/23/2018   Procedure: OPEN REDUCTION INTERNAL FIXATION (ORIF) TRIMALLEOLAR ANKLE FRACTURE;  Surgeon: Nicholes Stairs, MD;  Location: [facility];  Service: Orthopedics;  Laterality: Right;  120 mins   ORIF ANKLE FRACTURE Right 05/18/2018   Procedure: Right ankle open reduction internal fixation revision;  Surgeon: Nicholes Stairs, MD;  Location: [place];  Service: Orthopedics;  Laterality: Right;  120 mins  SHUNT EXTERNALIZATION     Head x 2   TIBIA OSTEOTOMY Right 12/23/2018   Procedure: RIGHT MEDIAL MALLEOLUS OSTEOTOMY;  Surgeon: Wylene Simmer, MD;  Location: [place];  Service: Orthopedics;  Laterality: Right;   WISDOM TOOTH EXTRACTION     Past Medical History:  Diagnosis Date   Chiari malformation type II ([place])    Family history of adverse reaction to anesthesia    grandfather died from anesthesia; grandfather had malignant hyperthermia   GERD (gastroesophageal reflux disease)    Hydrocephalus (HCC)    Malignant hyperthermia    Neuromuscular disorder (HCC)    Neuropathy   Osteoarthritis    PONV (postoperative nausea and vomiting)    Spina bifida ([place])    BP 126/77   Pulse 76   Ht 5' (1.524 m)   Wt (!) 323 lb (146.5 kg)   SpO2 96%   BMI 63.08 kg/m   Opioid Risk Score:   Fall Risk Score:  `1  Depression screen [facility] 2/9     08/21/2022    3:34 PM 03/14/2022    3:35 PM 06/06/2021    3:24 PM 10/19/2020    2:55 PM 09/07/2020    3:42 PM 04/03/2020    2:39 PM  Depression screen PHQ 2/9  Decreased Interest 0 0 1 1 1 2   Down, Depressed, Hopeless 0  0 1 1 1 2   PHQ - 2 Score 0 0 2 2 2 4   Altered sleeping      2  Tired, decreased energy      2  Change in appetite      2  Feeling bad or failure about yourself       2  Trouble concentrating      2  Moving slowly or fidgety/restless      0  Suicidal thoughts      0  PHQ-9 Score      14  Difficult doing work/chores      Extremely dIfficult      Review of Systems  Musculoskeletal:        Right foot pain Right Calf pain  All other systems reviewed and are negative.     Objective:   Physical Exam Vitals reviewed.  Constitutional:      Appearance: She is obese.  HENT:     Head: Normocephalic and atraumatic.  Eyes:     Extraocular Movements: Extraocular movements intact.     Conjunctiva/sclera: Conjunctivae normal.     Pupils: Pupils are equal, round, and reactive to light.  Musculoskeletal:     Comments: Lymphedema right lower extremity see images No open areas on the right foot  Patient has 3 -/5 movement at the ankle and the toes.  4/5 at the knee extensor.   Skin:    General: Skin is warm and dry.  Neurological:     Mental Status: She is oriented to person, place, and time.  Psychiatric:        Mood and Affect: Mood normal.        Behavior: Behavior normal.            Assessment & Plan:  #1.  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  She has chronic posttraumatic lymphedema right lower extremity which is contributing to her mobility issues.  Would recommend lymphedema management through therapy either PT or OT who are specially trained.  We will make referral to [facility] health physical and  Occupational Therapy. Continue Lyrica 100 mg 3 times daily. MD or nurse practitioner visit in 6 months

## 2023-02-19 ENCOUNTER — Other Ambulatory Visit: Payer: Self-pay | Admitting: Physical Medicine & Rehabilitation

## 2023-02-19 ENCOUNTER — Encounter
Payer: No Typology Code available for payment source | Attending: Physical Medicine & Rehabilitation | Admitting: Physical Medicine & Rehabilitation

## 2023-04-09 ENCOUNTER — Encounter: Payer: Self-pay | Admitting: Physical Medicine & Rehabilitation

## 2023-04-09 ENCOUNTER — Encounter
Payer: No Typology Code available for payment source | Attending: Physical Medicine & Rehabilitation | Admitting: Physical Medicine & Rehabilitation

## 2023-04-09 VITALS — BP 117/78 | HR 78 | Ht 60.0 in | Wt 321.0 lb

## 2023-04-09 DIAGNOSIS — I890 Lymphedema, not elsewhere classified: Secondary | ICD-10-CM

## 2023-04-09 NOTE — Patient Instructions (Signed)
What You Tube videos on Chair yoga

## 2023-04-09 NOTE — Progress Notes (Signed)
Subjective:    Patient ID: [name], female    DOB: September 26, 1971, 52 y.o.   MRN: [id number]  HPI 52 year old female with history of trimalleolar fracture right lower extremity who underwent initial external fixator placement followed by right tibiotalar calcaneal arthrodesis performed 6 months after initial injury.  She has comorbidities including morbid obesity, spina bifida with hydrocephalus, history of chest pain but with normal left heart catheterization.  She is having problems with shoewear fitting.  She has to buy extra-large croc to fit on her right lower extremity has not been able to wear closed shoes. Has some problem with bathing and dressing .  LE ADLs and tub transfers need assist   Working with neuro on cont EEG monitoring, has had standard EEGs which have not demonstrated seizure activity however the patient has reported seizure-like activity.  She is not on anticonvulsants. She follows up with neurosurgery for her hydrocephalus and VP shunt.  Pain Inventory Average Pain 4 Pain Right Now 2 My pain is stabbing and aching  In the last 24 hours, has pain interfered with the following? General activity 0 Relation with others 0 Enjoyment of life 2 What TIME of day is your pain at its worst? evening and night Sleep (in general) Fair  Pain is worse with: standing Pain improves with: medication Relief from Meds: 4  Family History  Problem Relation Age of Onset   Non-Hodgkin's lymphoma Mother    Hypertension Mother    Hypertension Father    Social History   Socioeconomic History   Marital status: Married    Spouse name: Not on file   Number of children: Not on file   Years of education: Not on file   Highest education level: Not on file  Occupational History   Not on file  Tobacco Use   Smoking status: Never   Smokeless tobacco: Never  Vaping Use   Vaping Use: Never used  Substance and Sexual Activity   Alcohol use: Never   Drug use: Never   Sexual  activity: Not on file  Other Topics Concern   Not on file  Social History Narrative   Not on file   Social Determinants of Health   Financial Resource Strain: Not on file  Food Insecurity: Not on file  Transportation Needs: Not on file  Physical Activity: Not on file  Stress: Not on file  Social Connections: Not on file   Past Surgical History:  Procedure Laterality Date   ANKLE ARTHROSCOPY Left    related to Tendon   ANKLE FUSION Right 12/23/2018   Procedure: right tibiotalar calcaneal arthrodesis;  Surgeon: Toni Arthurs, MD;  Location: [facility] OR;  Service: Orthopedics;  Laterality: Right;    ANKLE SURGERY Right 12/23/2018   BACK SURGERY  1971   Spinal Bifida   BACK SURGERY  2012   fusion   EXTERNAL FIXATION LEG Right 05/18/2018   Procedure: EXTERNAL FIXATION LEG;  Surgeon: Yolonda Kida, MD;  Location: [facility] OR;  Service: Orthopedics;  Laterality: Right;   EXTERNAL FIXATION REMOVAL Right 06/14/2018   Procedure: Right ankle external fixator removal with steinman pin placement;  Surgeon: Yolonda Kida, MD;  Location: [facility] OR;  Service: Orthopedics;  Laterality: Right;  60 mins   HARDWARE REMOVAL Right 05/18/2018   Procedure: HARDWARE REMOVAL;  Surgeon: Yolonda Kida, MD;  Location: [facility] OR;  Service: Orthopedics;  Laterality: Right;   HARDWARE REMOVAL Right 12/23/2018   Procedure: RIGHT ANKLE REMOVAL OF DEEP IMPLANTS;  Surgeon: Toni Arthurs, MD;  Location: [facility] OR;  Service: Orthopedics;  Laterality: Right;   KNEE ARTHROSCOPY Left 1983   ORIF ANKLE FRACTURE Right 04/23/2018   Procedure: OPEN REDUCTION INTERNAL FIXATION (ORIF) TRIMALLEOLAR ANKLE FRACTURE;  Surgeon: Yolonda Kida, MD;  Location: [facility] ORS;  Service: Orthopedics;  Laterality: Right;  120 mins   ORIF ANKLE FRACTURE Right 05/18/2018   Procedure: Right ankle open reduction internal fixation revision;  Surgeon: Yolonda Kida, MD;  Location: [facility] OR;  Service: Orthopedics;  Laterality: Right;  120  mins   SHUNT EXTERNALIZATION     Head x 2   TIBIA OSTEOTOMY Right 12/23/2018   Procedure: RIGHT MEDIAL MALLEOLUS OSTEOTOMY;  Surgeon: Toni Arthurs, MD;  Location: [facility] OR;  Service: Orthopedics;  Laterality: Right;   WISDOM TOOTH EXTRACTION     Past Surgical History:  Procedure Laterality Date   ANKLE ARTHROSCOPY Left    related to Tendon   ANKLE FUSION Right 12/23/2018   Procedure: right tibiotalar calcaneal arthrodesis;  Surgeon: Toni Arthurs, MD;  Location[facility] OR;  Service: Orthopedics;  Laterality: Right;    ANKLE SURGERY Right 12/23/2018   BACK SURGERY  1971   Spinal Bifida   BACK SURGERY  2012   fusion   EXTERNAL FIXATION LEG Right 05/18/2018   Procedure: EXTERNAL FIXATION LEG;  Surgeon: Yolonda Kida, MD;  Location: [facility] OR;  Service: Orthopedics;  Laterality: Right;   EXTERNAL FIXATION REMOVAL Right 06/14/2018   Procedure: Right ankle external fixator removal with steinman pin placement;  Surgeon: Yolonda Kida, MD;  Location: [facility] OR;  Service: Orthopedics;  Laterality: Right;  60 mins   HARDWARE REMOVAL Right 05/18/2018   Procedure: HARDWARE REMOVAL;  Surgeon: Yolonda Kida, MD;  Location: [facility] OR;  Service: Orthopedics;  Laterality: Right;   HARDWARE REMOVAL Right 12/23/2018   Procedure: RIGHT ANKLE REMOVAL OF DEEP IMPLANTS;  Surgeon: Toni Arthurs, MD;  Location: [facility] OR;  Service: Orthopedics;  Laterality: Right;   KNEE ARTHROSCOPY Left 1983   ORIF ANKLE FRACTURE Right 04/23/2018   Procedure: OPEN REDUCTION INTERNAL FIXATION (ORIF) TRIMALLEOLAR ANKLE FRACTURE;  Surgeon: Yolonda Kida, MD;  Location: [facility] ORS;  Service: Orthopedics;  Laterality: Right;  120 mins   ORIF ANKLE FRACTURE Right 05/18/2018   Procedure: Right ankle open reduction internal fixation revision;  Surgeon: Yolonda Kida, MD;  Location: [facility] OR;  Service: Orthopedics;  Laterality: Right;  120 mins   SHUNT EXTERNALIZATION     Head x 2   TIBIA OSTEOTOMY Right 12/23/2018    Procedure: RIGHT MEDIAL MALLEOLUS OSTEOTOMY;  Surgeon: Toni Arthurs, MD;  Location: [facility] OR;  Service: Orthopedics;  Laterality: Right;   WISDOM TOOTH EXTRACTION     Past Medical History:  Diagnosis Date   Chiari malformation type II (HCC)    Family history of adverse reaction to anesthesia    grandfather died from anesthesia; grandfather had malignant hyperthermia   GERD (gastroesophageal reflux disease)    Hydrocephalus (HCC)    Malignant hyperthermia    Neuromuscular disorder (HCC)    Neuropathy   Osteoarthritis    PONV (postoperative nausea and vomiting)    Spina bifida (HCC)    BP 117/78   Pulse 78   Ht 5' (1.524 m)   Wt (!) 321 lb (145.6 kg)   SpO2 93%   BMI 62.69 kg/m   Opioid Risk Score:   Fall Risk Score:  `1  Depression screen [facility] 2/9     04/09/2023  10:47 AM 08/21/2022    3:34 PM 03/14/2022    3:35 PM 06/06/2021    3:24 PM 10/19/2020    2:55 PM 09/07/2020    3:42 PM 04/03/2020    2:39 PM  Depression screen PHQ 2/9  Decreased Interest 0 0 0 1 1 1 2   Down, Depressed, Hopeless 0 0 0 1 1 1 2   PHQ - 2 Score 0 0 0 2 2 2 4   Altered sleeping       2  Tired, decreased energy       2  Change in appetite       2  Feeling bad or failure about yourself        2  Trouble concentrating       2  Moving slowly or fidgety/restless       0  Suicidal thoughts       0  PHQ-9 Score       14  Difficult doing work/chores       Extremely dIfficult     Review of Systems  Constitutional: Negative.   HENT: Negative.    Eyes: Negative.   Respiratory: Negative.    Cardiovascular: Negative.   Gastrointestinal: Negative.   Endocrine: Negative.   Genitourinary: Negative.   Musculoskeletal:  Positive for gait problem.       Feet  Skin: Negative.   Allergic/Immunologic: Negative.   Hematological: Negative.   Psychiatric/Behavioral: Negative.    All other systems reviewed and are negative.      Objective:   Physical Exam   General no acute distress Mood and affect are  appropriate Extremities 4+ edema right lower extremity starting below the knee left side has 1+ edema Motor strength is 4/5 bilateral hip flexor knee extensor and ankle dorsiflexor Sensation reduced below the knee right lower extremity.  There is no skin discoloration on the right side temperature is equal side-to-side.      Assessment & Plan:   1.  Chronic right lower extremity lymphedema posttraumatic following try malleolus fracture.  She does have nerve injury associated with this.  Due to the significant swelling, EMG/NCV would be technically impossible. Continue Lyrica 100 mg 3 times daily Discussed chair yoga to keep her mobility and strength as is to avoid increasing care needs. Custom made shoes due to severe edema Continue elevating lower extremities when she is sitting for prolonged period of time Return to clinic 6 months

## 2023-07-17 ENCOUNTER — Encounter: Payer: Self-pay | Admitting: Physical Medicine & Rehabilitation

## 2023-08-21 ENCOUNTER — Encounter: Payer: Self-pay | Admitting: Physical Medicine & Rehabilitation

## 2023-08-31 ENCOUNTER — Other Ambulatory Visit: Payer: Self-pay | Admitting: Neurological Surgery

## 2023-08-31 DIAGNOSIS — M316 Other giant cell arteritis: Secondary | ICD-10-CM

## 2023-09-14 ENCOUNTER — Ambulatory Visit
Admission: RE | Admit: 2023-09-14 | Discharge: 2023-09-14 | Disposition: A | Discharge status: Home / Self Care | Payer: Medicare Other | Source: Ambulatory Visit | Attending: Neurological Surgery | Admitting: Neurological Surgery

## 2023-09-14 DIAGNOSIS — M316 Other giant cell arteritis: Secondary | ICD-10-CM

## 2023-09-24 ENCOUNTER — Other Ambulatory Visit: Payer: Self-pay

## 2023-09-24 ENCOUNTER — Emergency Department (HOSPITAL_BASED_OUTPATIENT_CLINIC_OR_DEPARTMENT_OTHER): Payer: Medicare Other

## 2023-09-24 ENCOUNTER — Encounter (HOSPITAL_BASED_OUTPATIENT_CLINIC_OR_DEPARTMENT_OTHER): Payer: Self-pay | Admitting: *Deleted

## 2023-09-24 DIAGNOSIS — G6289 Other specified polyneuropathies: Secondary | ICD-10-CM | POA: Diagnosis not present

## 2023-09-24 DIAGNOSIS — R519 Headache, unspecified: Secondary | ICD-10-CM | POA: Diagnosis not present

## 2023-09-24 DIAGNOSIS — M79601 Pain in right arm: Secondary | ICD-10-CM | POA: Diagnosis present

## 2023-09-24 DIAGNOSIS — Z91040 Latex allergy status: Secondary | ICD-10-CM | POA: Diagnosis not present

## 2023-09-24 DIAGNOSIS — L259 Unspecified contact dermatitis, unspecified cause: Secondary | ICD-10-CM | POA: Diagnosis not present

## 2023-09-24 DIAGNOSIS — Z79899 Other long term (current) drug therapy: Secondary | ICD-10-CM | POA: Insufficient documentation

## 2023-09-24 LAB — CBC WITH DIFFERENTIAL/PLATELET
Abs Immature Granulocytes: 0.09 10*3/uL — ABNORMAL HIGH (ref 0.00–0.07)
Basophils Absolute: 0.1 10*3/uL (ref 0.0–0.1)
Basophils Relative: 0 %
Eosinophils Absolute: 0.4 10*3/uL (ref 0.0–0.5)
Eosinophils Relative: 3 %
HCT: 42.1 % (ref 36.0–46.0)
Hemoglobin: 13.8 g/dL (ref 12.0–15.0)
Immature Granulocytes: 1 %
Lymphocytes Relative: 14 %
Lymphs Abs: 1.8 10*3/uL (ref 0.7–4.0)
MCH: 30.9 pg (ref 26.0–34.0)
MCHC: 32.8 g/dL (ref 30.0–36.0)
MCV: 94.2 fL (ref 80.0–100.0)
Monocytes Absolute: 0.8 10*3/uL (ref 0.1–1.0)
Monocytes Relative: 6 %
Neutro Abs: 9.3 10*3/uL — ABNORMAL HIGH (ref 1.7–7.7)
Neutrophils Relative %: 76 %
Platelets: 305 10*3/uL (ref 150–400)
RBC: 4.47 MIL/uL (ref 3.87–5.11)
RDW: 15.2 % (ref 11.5–15.5)
WBC: 12.4 10*3/uL — ABNORMAL HIGH (ref 4.0–10.5)
nRBC: 0 % (ref 0.0–0.2)

## 2023-09-24 LAB — COMPREHENSIVE METABOLIC PANEL
ALT: 19 U/L (ref 0–44)
AST: 18 U/L (ref 15–41)
Albumin: 4.2 g/dL (ref 3.5–5.0)
Alkaline Phosphatase: 118 U/L (ref 38–126)
Anion gap: 13 (ref 5–15)
BUN: 15 mg/dL (ref 6–20)
CO2: 28 mmol/L (ref 22–32)
Calcium: 9.9 mg/dL (ref 8.9–10.3)
Chloride: 96 mmol/L — ABNORMAL LOW (ref 98–111)
Creatinine, Ser: 0.95 mg/dL (ref 0.44–1.00)
GFR, Estimated: >60 mL/min (ref >60)
Glucose, Bld: 105 mg/dL — ABNORMAL HIGH (ref 70–99)
Potassium: 4 mmol/L (ref 3.5–5.1)
Sodium: 137 mmol/L (ref 135–145)
Total Bilirubin: 0.6 mg/dL (ref 0.3–1.2)
Total Protein: 8.5 g/dL — ABNORMAL HIGH (ref 6.5–8.1)

## 2023-09-24 LAB — PROTIME-INR
INR: 1 (ref 0.8–1.2)
Prothrombin Time: 13.2 s (ref 11.4–15.2)

## 2023-09-24 NOTE — ED Triage Notes (Signed)
Pt reports pain in her legs and arms that feels like neuropathy.  She describes it as a burning and cramping pain.  Pt also has some res sport on her left arm.

## 2023-09-24 NOTE — ED Provider Triage Note (Signed)
Emergency Medicine Provider Triage Evaluation Note  [name] , a 52 y.o. female  was evaluated in triage.  Pt complains of rash on her arms and numbness and nerve pain in arms and legs.  Pt has a history of spina bifida and a shunt.  Pt saw Dr. Danielle Dess for shunt evaluation recently.   Review of Systems  Positive: Weakness headache Negative: fever  Physical Exam  BP 137/78   Pulse 93   Temp 97.9 F (36.6 C)   Resp 16   SpO2 97%  Gen:   Awake, no distress   Resp:  Normal effort  MSK:   Moves extremities without difficulty  Other:    Medical Decision Making  Medically screening exam initiated at 9:11 PM.  Appropriate orders placed.  [name] was informed that the remainder of the evaluation will be completed by another provider, this initial triage assessment does not replace that evaluation, and the importance of remaining in the ED until their evaluation is complete.     Elson Areas, [place] 09/24/23 2113

## 2023-09-25 ENCOUNTER — Emergency Department (HOSPITAL_BASED_OUTPATIENT_CLINIC_OR_DEPARTMENT_OTHER)
Admission: EM | Admit: 2023-09-25 | Discharge: 2023-09-25 | Disposition: A | Discharge status: Home / Self Care | Payer: Medicare Other | Attending: Emergency Medicine | Admitting: Emergency Medicine

## 2023-09-25 DIAGNOSIS — G6289 Other specified polyneuropathies: Secondary | ICD-10-CM

## 2023-09-25 DIAGNOSIS — L309 Dermatitis, unspecified: Secondary | ICD-10-CM

## 2023-09-25 MED ORDER — TRIAMCINOLONE ACETONIDE 0.1 % EX CREA: 1.0000 | TOPICAL_CREAM | Freq: Two times a day (BID) | CUTANEOUS | 0 refills | Status: AC

## 2023-09-25 NOTE — ED Notes (Signed)
Discharge instructions discussed with pt. Pt verbalized understanding. Pt stable and ambulatory.  °

## 2023-09-25 NOTE — ED Provider Notes (Signed)
[place] EMERGENCY DEPARTMENT AT [facility]  Provider Note  CSN: [id number] Arrival date & time: 09/24/23 1931  History Chief Complaint  Patient presents with   Pain   Rash    [name] is a 52 y.o. female with history of spina bifida, chiari malformation with VP shunt managed by Dr. Danielle Dess and chronic neuropathy reports several weeks of burning and cramping pain in her arms and legs, R sided headaches and a rash on her arms. She has been seen by Dr. Danielle Dess, PCP and neurology for same, had some medication changes which have helped some. PCP saw her for the rash and looks like was planning topical steroid but patient states this was not called in.    Home Medications Prior to Admission medications   Medication Sig Start Date End Date Taking? Authorizing Provider  triamcinolone cream (KENALOG) 0.1 % Apply 1 Application topically 2 (two) times daily. 09/25/23  Yes Pollyann Savoy, MD  acetaminophen (TYLENOL) 325 MG tablet Take by mouth. 11/05/19   [provider]  ADVAIR DISKUS 250-50 MCG/ACT AEPB SMARTSIG:1 Puff(s) Via Inhaler Every 12 Hours 05/15/22   [provider]  albuterol (VENTOLIN HFA) 108 (90 Base) MCG/ACT inhaler Inhale into the lungs. 02/25/22   [provider]  b complex vitamins capsule Take 1 capsule by mouth daily.    [provider]  cyanocobalamin (VITAMIN B12) 1000 MCG/ML injection Inject into the skin. 11/19/21   [provider]  cyanocobalamin 1000 MCG tablet Take 1,000 mcg by mouth daily. Injection monthly    [provider]  DULoxetine (CYMBALTA) 60 MG capsule Take 1 capsule by mouth daily. 11/19/21   [provider]  folic acid (FOLVITE) 1 MG tablet Take 1 tablet by mouth daily. 08/07/20   [provider]  ibuprofen (ADVIL,MOTRIN) 200 MG tablet Take 400 mg by mouth every 6 (six) hours as needed for headache or moderate pain.     [provider]  isosorbide mononitrate  (IMDUR) 30 MG 24 hr tablet Take 1 tablet by mouth 2 (two) times daily. 03/25/22   [provider]  methocarbamol (ROBAXIN) 500 MG tablet Take by mouth. 05/25/19   [provider]  metoprolol tartrate (LOPRESSOR) 50 MG tablet Take 50 mg by mouth 2 (two) times daily. 05/31/21   [provider]  pregabalin (LYRICA) 100 MG capsule TAKE 1 CAPSULE BY MOUTH 3 TIMES DAILY 02/19/23   Kirsteins, Victorino Sparrow, MD  Vitamin D, Cholecalciferol, 25 MCG (1000 UT) CAPS Take 4,000 Units by mouth.    [provider]     Allergies    Anesthetics, amide; Gabapentin; Propofol; Succinylcholine; Acetaminophen; Oxycodone hcl; Latex; Other; and Oxycodone-acetaminophen   Review of Systems   Review of Systems Please see HPI for pertinent positives and negatives  Physical Exam BP 126/69   Pulse 93   Temp 97.9 F (36.6 C)   Resp 18   SpO2 95%   Physical Exam Vitals and nursing note reviewed.  HENT:     Head: Normocephalic.     Nose: Nose normal.  Eyes:     Extraocular Movements: Extraocular movements intact.  Pulmonary:     Effort: Pulmonary effort is normal.  Musculoskeletal:        General: Normal range of motion.     Cervical back: Neck supple.  Skin:    Findings: Rash (patchy, erythematous, ?nodular rash on bilateral elbows) present.  Neurological:     Mental Status: She is alert and oriented to  person, place, and time.  Psychiatric:        Mood and Affect: Mood normal.     ED Results / Procedures / Treatments   EKG None  Procedures Procedures  Medications Ordered in the ED Medications - No data to display  Initial Impression and Plan  Patient here with chronic neuropathy, labs done in triage show unremarkable CBC and CMP. I personally viewed the images from radiology studies and agree with radiologist interpretation: CT head shows stable VP shunt. No additional ED workup is indicated. Will Rx triamcinolone cream for the rash and recommend PCP follow up, RTED  for any other concerns.    ED Course       MDM Rules/Calculators/A&P Medical Decision Making Problems Addressed: Dermatitis: acute illness or injury Other polyneuropathy: chronic illness or injury  Amount and/or Complexity of Data Reviewed Labs: ordered. Decision-making details documented in ED Course. Radiology: ordered and independent interpretation performed. Decision-making details documented in ED Course.  Risk Prescription drug management.     Final Clinical Impression(s) / ED Diagnoses Final diagnoses:  Other polyneuropathy  Dermatitis    Rx / DC Orders ED Discharge Orders          Ordered    triamcinolone cream (KENALOG) 0.1 %  2 times daily        09/25/23 0143             Pollyann Savoy, MD 09/25/23 [phone]

## 2023-10-09 ENCOUNTER — Ambulatory Visit: Payer: Medicare Other | Admitting: Registered Nurse

## 2023-10-09 ENCOUNTER — Other Ambulatory Visit: Payer: Self-pay | Admitting: Physical Medicine & Rehabilitation

## 2023-10-09 ENCOUNTER — Encounter: Payer: Medicare Other | Attending: Registered Nurse | Admitting: Registered Nurse

## 2023-10-09 ENCOUNTER — Encounter: Payer: Self-pay | Admitting: Registered Nurse

## 2023-10-09 VITALS — BP 121/78 | HR 69 | Ht 60.0 in

## 2023-10-09 DIAGNOSIS — G8929 Other chronic pain: Secondary | ICD-10-CM | POA: Diagnosis present

## 2023-10-09 DIAGNOSIS — I890 Lymphedema, not elsewhere classified: Secondary | ICD-10-CM | POA: Diagnosis present

## 2023-10-09 DIAGNOSIS — M25571 Pain in right ankle and joints of right foot: Secondary | ICD-10-CM | POA: Insufficient documentation

## 2023-10-09 DIAGNOSIS — G894 Chronic pain syndrome: Secondary | ICD-10-CM | POA: Insufficient documentation

## 2023-10-09 MED ORDER — PREGABALIN 100 MG PO CAPS: 100.0000 mg | ORAL_CAPSULE | Freq: Three times a day (TID) | ORAL | 5 refills | Status: DC

## 2023-10-09 NOTE — Progress Notes (Signed)
Subjective:    Patient ID: [name], female    DOB: [birth year], 52 y.o.   MRN: [id number]  HPI: [name] is a 52 y.o. female who returns for follow up appointment for chronic pain and medication refill. She states her  pain is located in her Lower extremities with tingling and burning and right ankle pain. She rates her pain 2. Her current exercise regime is walking short distances in her home with walker.   [name] reports over the last three months she was experiencing increase tingling and numbness in her bilateral upper and lower extremities, and rash she was evaluated by her Neurologist, Neurosurgeon and PCP.   [name] went to [facility] ED on 09/25/2023  for polyneuropathy, note was reviewed.    Pain Inventory Average Pain 4 Pain Right Now 2 My pain is dull, stabbing, and aching  In the last 24 hours, has pain interfered with the following? General activity 7 Relation with others 0 Enjoyment of life 7 What TIME of day is your pain at its worst? daytime and evening Sleep (in general) Good  Pain is worse with: standing and some activites Pain improves with: rest and medication Relief from Meds: 5  Family History  Problem Relation Age of Onset   Non-Hodgkin's lymphoma Mother    Hypertension Mother    Hypertension Father    Social History   Socioeconomic History   Marital status: Married    Spouse name: Not on file   Number of children: Not on file   Years of education: Not on file   Highest education level: Not on file  Occupational History   Not on file  Tobacco Use   Smoking status: Never   Smokeless tobacco: Never  Vaping Use   Vaping status: Never Used  Substance and Sexual Activity   Alcohol use: Never   Drug use: Never   Sexual activity: Not on file  Other Topics Concern   Not on file  Social History Narrative   Not on file   Social Determinants of Health   Financial Resource Strain: Not on file  Food Insecurity: Not on  file  Transportation Needs: Not on file  Physical Activity: Not on file  Stress: Not on file  Social Connections: Unknown (02/03/2023)   [facility] from [facility]   Social Network    Social Network: Not on file   Past Surgical History:  Procedure Laterality Date   ANKLE ARTHROSCOPY Left    related to Tendon   ANKLE FUSION Right 12/23/2018   Procedure: right tibiotalar calcaneal arthrodesis;  Surgeon: Toni Arthurs, MD;  Location: [facility] OR;  Service: Orthopedics;  Laterality: Right;    ANKLE SURGERY Right 12/23/2018   BACK SURGERY  1971   Spinal Bifida   BACK SURGERY  2012   fusion   EXTERNAL FIXATION LEG Right 05/18/2018   Procedure: EXTERNAL FIXATION LEG;  Surgeon: Yolonda Kida, MD;  Location: [facility] OR;  Service: Orthopedics;  Laterality: Right;   EXTERNAL FIXATION REMOVAL Right 06/14/2018   Procedure: Right ankle external fixator removal with steinman pin placement;  Surgeon: Yolonda Kida, MD;  Location: [facility] OR;  Service: Orthopedics;  Laterality: Right;  60 mins   HARDWARE REMOVAL Right 05/18/2018   Procedure: HARDWARE REMOVAL;  Surgeon: Yolonda Kida, MD;  Location: [facility] OR;  Service: Orthopedics;  Laterality: Right;   HARDWARE REMOVAL Right 12/23/2018   Procedure: RIGHT ANKLE REMOVAL OF DEEP IMPLANTS;  Surgeon: Victorino Dike,  Jonny Ruiz, MD;  Location: [facility] OR;  Service: Orthopedics;  Laterality: Right;   KNEE ARTHROSCOPY Left 1983   ORIF ANKLE FRACTURE Right 04/23/2018   Procedure: OPEN REDUCTION INTERNAL FIXATION (ORIF) TRIMALLEOLAR ANKLE FRACTURE;  Surgeon: Yolonda Kida, MD;  Location: [facility];  Service: Orthopedics;  Laterality: Right;  120 mins   ORIF ANKLE FRACTURE Right 05/18/2018   Procedure: Right ankle open reduction internal fixation revision;  Surgeon: Yolonda Kida, MD;  Location: [facility] OR;  Service: Orthopedics;  Laterality: Right;  120 mins   SHUNT EXTERNALIZATION     Head x 2   TIBIA OSTEOTOMY Right 12/23/2018   Procedure: RIGHT  MEDIAL MALLEOLUS OSTEOTOMY;  Surgeon: Toni Arthurs, MD;  Location: [facility] OR;  Service: Orthopedics;  Laterality: Right;   WISDOM TOOTH EXTRACTION     Past Surgical History:  Procedure Laterality Date   ANKLE ARTHROSCOPY Left    related to Tendon   ANKLE FUSION Right 12/23/2018   Procedure: right tibiotalar calcaneal arthrodesis;  Surgeon: Toni Arthurs, MD;  Location: [facility] OR;  Service: Orthopedics;  Laterality: Right;    ANKLE SURGERY Right 12/23/2018   BACK SURGERY  1971   Spinal Bifida   BACK SURGERY  2012   fusion   EXTERNAL FIXATION LEG Right 05/18/2018   Procedure: EXTERNAL FIXATION LEG;  Surgeon: Yolonda Kida, MD;  Location: [facility] OR;  Service: Orthopedics;  Laterality: Right;   EXTERNAL FIXATION REMOVAL Right 06/14/2018   Procedure: Right ankle external fixator removal with steinman pin placement;  Surgeon: Yolonda Kida, MD;  Location: [facility] OR;  Service: Orthopedics;  Laterality: Right;  60 mins   HARDWARE REMOVAL Right 05/18/2018   Procedure: HARDWARE REMOVAL;  Surgeon: Yolonda Kida, MD;  Location: [facility] OR;  Service: Orthopedics;  Laterality: Right;   HARDWARE REMOVAL Right 12/23/2018   Procedure: RIGHT ANKLE REMOVAL OF DEEP IMPLANTS;  Surgeon: Toni Arthurs, MD;  Location: [facility] OR;  Service: Orthopedics;  Laterality: Right;   KNEE ARTHROSCOPY Left 1983   ORIF ANKLE FRACTURE Right 04/23/2018   Procedure: OPEN REDUCTION INTERNAL FIXATION (ORIF) TRIMALLEOLAR ANKLE FRACTURE;  Surgeon: Yolonda Kida, MD;  Location: [facility];  Service: Orthopedics;  Laterality: Right;  120 mins   ORIF ANKLE FRACTURE Right 05/18/2018   Procedure: Right ankle open reduction internal fixation revision;  Surgeon: Yolonda Kida, MD;  Location: [facility] OR;  Service: Orthopedics;  Laterality: Right;  120 mins   SHUNT EXTERNALIZATION     Head x 2   TIBIA OSTEOTOMY Right 12/23/2018   Procedure: RIGHT MEDIAL MALLEOLUS OSTEOTOMY;  Surgeon: Toni Arthurs, MD;  Location: [facility] OR;  Service:  Orthopedics;  Laterality: Right;   WISDOM TOOTH EXTRACTION     Past Medical History:  Diagnosis Date   Chiari malformation type II (HCC)    Family history of adverse reaction to anesthesia    grandfather died from anesthesia; grandfather had malignant hyperthermia   GERD (gastroesophageal reflux disease)    Hydrocephalus (HCC)    Malignant hyperthermia    Neuromuscular disorder (HCC)    Neuropathy   Osteoarthritis    PONV (postoperative nausea and vomiting)    Spina bifida (HCC)    Ht 5' (1.524 m)   BMI 62.69 kg/m   Opioid Risk Score:   Fall Risk Score:  `1  Depression screen [facility] 2/9     10/09/2023    3:18 PM 04/09/2023   10:47 AM 08/21/2022    3:34 PM 03/14/2022    3:35 PM 06/06/2021  3:24 PM 10/19/2020    2:55 PM 09/07/2020    3:42 PM  Depression screen PHQ 2/9  Decreased Interest 0 0 0 0 1 1 1   Down, Depressed, Hopeless 0 0 0 0 1 1 1   PHQ - 2 Score 0 0 0 0 2 2 2       Review of Systems  Musculoskeletal:  Positive for gait problem.  All other systems reviewed and are negative.     Objective:   Physical Exam Vitals and nursing note reviewed.  Constitutional:      Appearance: Normal appearance. She is obese.  Cardiovascular:     Rate and Rhythm: Normal rate and regular rhythm.     Pulses: Normal pulses.     Heart sounds: Normal heart sounds.  Pulmonary:     Effort: Pulmonary effort is normal.     Breath sounds: Normal breath sounds.  Musculoskeletal:     Comments: Normal Muscle Bulk and Muscle Testing Reveals:  Upper Extremities: Full ROM and Muscle Strength  5/5  Lower Extremities: Right : Decreased ROM and Muscle Strength 4/5 Left Lower Extremity Full ROM and Muscle Strength 5/5 Bilateral Lower Extremities: Lymphedema Arrived in scooter     Skin:    General: Skin is warm and dry.  Neurological:     Mental Status: She is alert and oriented to person, place, and time.  Psychiatric:        Mood and Affect: Mood normal.        Behavior: Behavior normal.          Assessment & Plan:  1.  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  She does have nerve injury associated with this: PerDr Kirsteins Note:  Refilled:  Lyrica 100 mg 3 times daily #90.  Referral Place for Lymphedema Clinic : Continue elevating lower extremities when she is sitting for prolong period of time.   F/U in 6 months

## 2023-11-04 ENCOUNTER — Ambulatory Visit: Payer: Medicare Other | Admitting: Occupational Therapy

## 2023-11-12 ENCOUNTER — Ambulatory Visit: Payer: Medicare Other | Attending: Registered Nurse | Admitting: Occupational Therapy

## 2024-04-01 ENCOUNTER — Encounter: Payer: Self-pay | Admitting: Registered Nurse

## 2024-04-01 ENCOUNTER — Encounter: Payer: Medicare Other | Attending: Registered Nurse | Admitting: Registered Nurse

## 2024-04-01 VITALS — BP 100/67 | HR 62 | Resp 16 | Ht 60.0 in | Wt 330.0 lb

## 2024-04-01 DIAGNOSIS — G8929 Other chronic pain: Secondary | ICD-10-CM | POA: Insufficient documentation

## 2024-04-01 DIAGNOSIS — I890 Lymphedema, not elsewhere classified: Secondary | ICD-10-CM | POA: Insufficient documentation

## 2024-04-01 DIAGNOSIS — G894 Chronic pain syndrome: Secondary | ICD-10-CM | POA: Diagnosis not present

## 2024-04-01 DIAGNOSIS — M25571 Pain in right ankle and joints of right foot: Secondary | ICD-10-CM | POA: Insufficient documentation

## 2024-04-01 MED ORDER — PREGABALIN 100 MG PO CAPS: 100.0000 mg | ORAL_CAPSULE | Freq: Three times a day (TID) | ORAL | 5 refills | Status: DC

## 2024-04-01 NOTE — Progress Notes (Signed)
 Subjective:    Patient ID: [name], female    DOB: Jan 04, 1971, 53 y.o.   MRN: [id number]  HPI: [name] is a 53 y.o. female who returns for follow up appointment for chronic pain and medication refill. She states her pain is located in her right ankle. She rates her pain 0 on Health and History  form. She is not following a current exercise regimen. She reports she is unable to walk, and usually in her Motorized wheelchair.   Pain Inventory Average Pain 4 Pain Right Now 0 My pain is dull and stabbing  In the last 24 hours, has pain interfered with the following? General activity 10 Relation with others 0 Enjoyment of life 7 What TIME of day is your pain at its worst? evening Sleep (in general) Fair  Pain is worse with: walking, bending, standing, and some activites Pain improves with: medication Relief from Meds: 5  Family History  Problem Relation Age of Onset   Non-Hodgkin's lymphoma Mother    Hypertension Mother    Hypertension Father    Social History   Socioeconomic History   Marital status: Married    Spouse name: Not on file   Number of children: Not on file   Years of education: Not on file   Highest education level: Not on file  Occupational History   Not on file  Tobacco Use   Smoking status: Never   Smokeless tobacco: Never  Vaping Use   Vaping status: Never Used  Substance and Sexual Activity   Alcohol  use: Never   Drug use: Never   Sexual activity: Not on file  Other Topics Concern   Not on file  Social History Narrative   Not on file   Social Drivers of Health   Financial Resource Strain: Not on file  Food Insecurity: Not on file  Transportation Needs: Not on file  Physical Activity: Not on file  Stress: Not on file  Social Connections: Unknown (02/03/2023)   Received from [facility]   Social Network    Social Network: Not on file   Past Surgical History:  Procedure Laterality Date   ANKLE ARTHROSCOPY  Left    related to Tendon   ANKLE FUSION Right 12/23/2018   Procedure: right tibiotalar calcaneal arthrodesis;  Surgeon: Amada Backer, MD;  Location: [facility] OR;  Service: Orthopedics;  Laterality: Right;    ANKLE SURGERY Right 12/23/2018   BACK SURGERY  1971   Spinal Bifida   BACK SURGERY  2012   fusion   EXTERNAL FIXATION LEG Right 05/18/2018   Procedure: EXTERNAL FIXATION LEG;  Surgeon: Janeth Medicus, MD;  Location: [facility] OR;  Service: Orthopedics;  Laterality: Right;   EXTERNAL FIXATION REMOVAL Right 06/14/2018   Procedure: Right ankle external fixator removal with steinman pin placement;  Surgeon: Janeth Medicus, MD;  Location: [facility] OR;  Service: Orthopedics;  Laterality: Right;  60 mins   HARDWARE REMOVAL Right 05/18/2018   Procedure: HARDWARE REMOVAL;  Surgeon: Janeth Medicus, MD;  Location: [facility] OR;  Service: Orthopedics;  Laterality: Right;   HARDWARE REMOVAL Right 12/23/2018   Procedure: RIGHT ANKLE REMOVAL OF DEEP IMPLANTS;  Surgeon: Amada Backer, MD;  Location: [facility] OR;  Service: Orthopedics;  Laterality: Right;   KNEE ARTHROSCOPY Left 1983   ORIF ANKLE FRACTURE Right 04/23/2018   Procedure: OPEN REDUCTION INTERNAL FIXATION (ORIF) TRIMALLEOLAR ANKLE FRACTURE;  Surgeon: Janeth Medicus, MD;  Location: [facility] ORS;  Service: Orthopedics;  Laterality: Right;  120 mins   ORIF ANKLE FRACTURE Right 05/18/2018   Procedure: Right ankle open reduction internal fixation revision;  Surgeon: Janeth Medicus, MD;  Location: [facility] OR;  Service: Orthopedics;  Laterality: Right;  120 mins   SHUNT EXTERNALIZATION     Head x 2   TIBIA OSTEOTOMY Right 12/23/2018   Procedure: RIGHT MEDIAL MALLEOLUS OSTEOTOMY;  Surgeon: Amada Backer, MD;  Location: [facility] OR;  Service: Orthopedics;  Laterality: Right;   WISDOM TOOTH EXTRACTION     Past Surgical History:  Procedure Laterality Date   ANKLE ARTHROSCOPY Left    related to Tendon   ANKLE FUSION Right 12/23/2018   Procedure: right tibiotalar  calcaneal arthrodesis;  Surgeon: Amada Backer, MD;  Location: [facility] OR;  Service: Orthopedics;  Laterality: Right;    ANKLE SURGERY Right 12/23/2018   BACK SURGERY  1971   Spinal Bifida   BACK SURGERY  2012   fusion   EXTERNAL FIXATION LEG Right 05/18/2018   Procedure: EXTERNAL FIXATION LEG;  Surgeon: Janeth Medicus, MD;  Location: [facility] OR;  Service: Orthopedics;  Laterality: Right;   EXTERNAL FIXATION REMOVAL Right 06/14/2018   Procedure: Right ankle external fixator removal with steinman pin placement;  Surgeon: Janeth Medicus, MD;  Location: [facility] OR;  Service: Orthopedics;  Laterality: Right;  60 mins   HARDWARE REMOVAL Right 05/18/2018   Procedure: HARDWARE REMOVAL;  Surgeon: Janeth Medicus, MD;  Location: [facility] OR;  Service: Orthopedics;  Laterality: Right;   HARDWARE REMOVAL Right 12/23/2018   Procedure: RIGHT ANKLE REMOVAL OF DEEP IMPLANTS;  Surgeon: Amada Backer, MD;  Location: [facility] OR;  Service: Orthopedics;  Laterality: Right;   KNEE ARTHROSCOPY Left 1983   ORIF ANKLE FRACTURE Right 04/23/2018   Procedure: OPEN REDUCTION INTERNAL FIXATION (ORIF) TRIMALLEOLAR ANKLE FRACTURE;  Surgeon: Janeth Medicus, MD;  Location: [facility] ORS;  Service: Orthopedics;  Laterality: Right;  120 mins   ORIF ANKLE FRACTURE Right 05/18/2018   Procedure: Right ankle open reduction internal fixation revision;  Surgeon: Janeth Medicus, MD;  Location: [facility] OR;  Service: Orthopedics;  Laterality: Right;  120 mins   SHUNT EXTERNALIZATION     Head x 2   TIBIA OSTEOTOMY Right 12/23/2018   Procedure: RIGHT MEDIAL MALLEOLUS OSTEOTOMY;  Surgeon: Amada Backer, MD;  Location: [facility] OR;  Service: Orthopedics;  Laterality: Right;   WISDOM TOOTH EXTRACTION     Past Medical History:  Diagnosis Date   Chiari malformation type II (HCC)    Family history of adverse reaction to anesthesia    grandfather died from anesthesia; grandfather had malignant hyperthermia   GERD (gastroesophageal reflux disease)     Hydrocephalus (HCC)    Malignant hyperthermia    Neuromuscular disorder (HCC)    Neuropathy   Osteoarthritis    PONV (postoperative nausea and vomiting)    Spina bifida (HCC)    BP 100/67   Pulse 62   Resp 16   Ht 5' (1.524 m)   Wt (!) 330 lb (149.7 kg)   SpO2 91%   BMI 64.45 kg/m   Opioid Risk Score:   Fall Risk Score:  `1  Depression screen [facility] 2/9     10/09/2023    3:18 PM 04/09/2023   10:47 AM 08/21/2022    3:34 PM 03/14/2022    3:35 PM 06/06/2021    3:24 PM 10/19/2020    2:55 PM 09/07/2020    3:42 PM  Depression screen PHQ 2/9  Decreased Interest 0 0  0 0 1 1 1   Down, Depressed, Hopeless 0 0 0 0 1 1 1   PHQ - 2 Score 0 0 0 0 2 2 2       Review of Systems  Musculoskeletal:        B/l shoulders B/l arms B/l wrist B/l legs Right ankle Lower back        Objective:   Physical Exam Vitals and nursing note reviewed.  Constitutional:      Appearance: Normal appearance.  Cardiovascular:     Rate and Rhythm: Normal rate and regular rhythm.     Pulses: Normal pulses.     Heart sounds: Normal heart sounds.  Pulmonary:     Effort: Pulmonary effort is normal.     Breath sounds: Normal breath sounds.  Musculoskeletal:     Comments: Normal Muscle Bulk and Muscle Testing Reveals:  Upper Extremities: Decreased ROM 45 Degrees  and Muscle Strength 5/5  Lumbar Paraspinal Tenderness: L-4-L-5 Lower Extremities: Decreased ROM and muscle Strength 5/5 Arrived in Motorized W/C    Skin:    General: Skin is warm and dry.  Neurological:     Mental Status: She is alert and oriented to person, place, and time.  Psychiatric:        Mood and Affect: Mood normal.        Behavior: Behavior normal.         Assessment & Plan:  1.  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  She does have nerve injury associated with this: PerDr Kirsteins Note:  Refilled:  Lyrica  100 mg 3 times daily #90. 04/01/2024  : Continue elevating lower  extremities when she is sitting for prolong period of time.  F/U in 6 months

## 2024-10-13 ENCOUNTER — Encounter: Admitting: Registered Nurse

## 2024-11-17 ENCOUNTER — Telehealth: Payer: Self-pay | Admitting: Registered Nurse

## 2024-11-17 ENCOUNTER — Telehealth: Payer: Self-pay

## 2024-11-17 DIAGNOSIS — G894 Chronic pain syndrome: Secondary | ICD-10-CM

## 2024-11-17 DIAGNOSIS — G8929 Other chronic pain: Secondary | ICD-10-CM

## 2024-11-17 MED ORDER — PREGABALIN 100 MG PO CAPS: 100.0000 mg | ORAL_CAPSULE | Freq: Two times a day (BID) | ORAL | 0 refills | Status: DC

## 2024-11-17 NOTE — Telephone Encounter (Signed)
 Patient called for a refill on Pregabalin . Last visit note states for patient to return in Nov. No appt. Called patient to let her know she would need an appt, no answer and VM full. I will send patient a MyChart message.

## 2024-11-17 NOTE — Telephone Encounter (Signed)
 Return Ms. Gurka call, she reports she has been taking her Lyrica  twice a day, she has a scheduled appointment with Dr. Carilyn this month, she will discussed her neuropathic pain with him. Her last office visit was in May, she verbalizes understanding.  Pregabalin  prescription sent to pharmacy.

## 2024-11-17 NOTE — Telephone Encounter (Signed)
 Patient has an appointment with Dr. Carilyn on 12/30. Can you send in refill on Pregabalin ?

## 2024-11-29 ENCOUNTER — Encounter: Payer: Self-pay | Admitting: Physical Medicine & Rehabilitation

## 2024-11-29 ENCOUNTER — Encounter: Attending: Physical Medicine & Rehabilitation | Admitting: Physical Medicine & Rehabilitation

## 2024-11-29 VITALS — BP 129/87 | HR 72 | Ht 60.0 in

## 2024-11-29 DIAGNOSIS — R5381 Other malaise: Secondary | ICD-10-CM | POA: Insufficient documentation

## 2024-11-29 DIAGNOSIS — G894 Chronic pain syndrome: Secondary | ICD-10-CM | POA: Diagnosis present

## 2024-11-29 DIAGNOSIS — M25571 Pain in right ankle and joints of right foot: Secondary | ICD-10-CM | POA: Diagnosis present

## 2024-11-29 DIAGNOSIS — G8929 Other chronic pain: Secondary | ICD-10-CM | POA: Diagnosis present

## 2024-11-29 MED ORDER — PREGABALIN 150 MG PO CAPS: 150.0000 mg | ORAL_CAPSULE | Freq: Two times a day (BID) | ORAL | 1 refills | Status: AC

## 2024-11-29 NOTE — Patient Instructions (Addendum)
" °  VISIT SUMMARY: During your visit, we discussed your worsening pain and functional decline, particularly in your extremities. We reviewed your chronic pain syndrome, possible fibromyalgia, chronic right lower extremity lymphedema, and potential carpal tunnel syndrome.  YOUR PLAN: CHRONIC PAIN SYNDROME WITH POSSIBLE FIBROMYALGIA: You have chronic, widespread pain with hypersensitivity, likely due to fibromyalgia. -Increase pregabalin  to 150 mg twice a day (total 300 mg daily). -Follow up in six weeks to assess response and adjust therapy as needed.  CHRONIC RIGHT LOWER EXTREMITY LYMPHEDEMA: You have chronic lymphedema in your right lower leg, which affects your mobility and function. -Continue physical therapy for lymphedema management, including wrapping and exercise.  POSSIBLE CARPAL TUNNEL SYNDROME: You have intermittent numbness in your hands, particularly on the left side, which may be due to carpal tunnel syndrome. -A nerve conduction study (EMG/NCV) of your hands has been ordered to evaluate for carpal tunnel syndrome.                      Contains text generated by Abridge.                                 Contains text generated by Abridge.   "

## 2024-11-29 NOTE — Progress Notes (Signed)
 "  Subjective:    Patient ID: [name], female    DOB: October 29, 1971, 53 y.o.   MRN: [id number]  HPI Discussed the use of AI scribe software for clinical note transcription with the patient, who gave verbal consent to proceed.  History of Present Illness [name] is a 53 year old female with chronic right lower extremity lymphedema and chronic pain syndrome who presents for evaluation of worsening pain and functional decline.  Over the past year, she has developed progressive, severe pain involving both upper and lower extremities, described as burning, throbbing, and achy. The pain is accompanied by marked allodynia, with hypersensitivity to touch such that even air contact elicits pain.  She is unable to lift her arms and has significant difficulty with activities of daily living, including bathing and dressing, often requiring assistance. Ambulation is limited to approximately twenty steps with markedly reduced stride length due to pain and weakness.  She experiences spontaneous numbness in her hands and arms, predominantly affecting the left side at rest. Profound fatigue is present, with sleep duration reaching up to twenty hours per day, as corroborated by her husband.  She also describes intermittent throat pain and episodes of transient aphonia, which resolve spontaneously.  She has previously experienced large purple ecchymoses with underlying nodules, though these have not recurred recently and prior platelet counts were normal.  She is currently taking pregabalin  100 mg twice daily and hydroxychloroquine at a reduced dose. She is scheduled to begin physical therapy for right lower extremity lymphedema and has been provided with a new wrap and home exercise program. The patient has been evaluated by neurosurgery, neurology as well as rheumatology.  She has also recently seen her primary care physician with similar complaints. Pain Inventory Average Pain 7 Pain Right  Now 6 My pain is burning, stabbing, and aching  In the last 24 hours, has pain interfered with the following? General activity 10 Relation with others 7 Enjoyment of life 7 What TIME of day is your pain at its worst? morning  Sleep (in general) Good  Pain is worse with: walking, standing, and some activites Pain improves with: na Relief from Meds: 5  Family History  Problem Relation Age of Onset   Non-Hodgkin's lymphoma Mother    Hypertension Mother    Hypertension Father    Social History   Socioeconomic History   Marital status: Married    Spouse name: Not on file   Number of children: Not on file   Years of education: Not on file   Highest education level: Not on file  Occupational History   Not on file  Tobacco Use   Smoking status: Never   Smokeless tobacco: Never  Vaping Use   Vaping status: Never Used  Substance and Sexual Activity   Alcohol  use: Never   Drug use: Never   Sexual activity: Not on file  Other Topics Concern   Not on file  Social History Narrative   Not on file   Social Drivers of Health   Tobacco Use: Low Risk (11/29/2024)   Patient History    Smoking Tobacco Use: Never    Smokeless Tobacco Use: Never    Passive Exposure: Not on file  Financial Resource Strain: Not on file  Food Insecurity: Low Risk (10/04/2024)   Received from [facility]   Epic    Within the past 12 months, you worried that your food would run out before you got money to buy more: Never true  Within the past 12 months, the food you bought just didn't last and you didn't have money to get more. : Never true  Transportation Needs: No Transportation Needs (10/04/2024)   Received from [facility]    In the past 12 months, has lack of reliable transportation kept you from medical appointments, meetings, work or from getting things needed for daily living? : No  Physical Activity: Not on file  Stress: Not on file  Social Connections: Unknown  (04/14/2022)   Received from [facility]   Social Network    Social Network: Not on file  Depression (PHQ2-9): Low Risk (11/29/2024)   Depression (PHQ2-9)    PHQ-2 Score: 0  Alcohol  Screen: Not on file  Housing: Low Risk (10/04/2024)   Received from [facility]   Epic    What is your living situation today?: I have a steady place to live    Think about the place you live. Do you have problems with any of the following? Choose all that apply:: Not on file  Utilities: Low Risk (10/04/2024)   Received from [facility]   Utilities    In the past 12 months has the electric, gas, oil, or water company threatened to shut off services in your home? : No  Health Literacy: Not on file   Past Surgical History:  Procedure Laterality Date   ANKLE ARTHROSCOPY Left    related to Tendon   ANKLE FUSION Right 12/23/2018   Procedure: right tibiotalar calcaneal arthrodesis;  Surgeon: Kit Rush, MD;  Location: [facility] OR;  Service: Orthopedics;  Laterality: Right;    ANKLE SURGERY Right 12/23/2018   BACK SURGERY  1971   Spinal Bifida   BACK SURGERY  2012   fusion   EXTERNAL FIXATION LEG Right 05/18/2018   Procedure: EXTERNAL FIXATION LEG;  Surgeon: Sharl Selinda Dover, MD;  Location: [facility] OR;  Service: Orthopedics;  Laterality: Right;   EXTERNAL FIXATION REMOVAL Right 06/14/2018   Procedure: Right ankle external fixator removal with steinman pin placement;  Surgeon: Sharl Selinda Dover, MD;  Location: [facility] OR;  Service: Orthopedics;  Laterality: Right;  60 mins   HARDWARE REMOVAL Right 05/18/2018   Procedure: HARDWARE REMOVAL;  Surgeon: Sharl Selinda Dover, MD;  Location: [facility] OR;  Service: Orthopedics;  Laterality: Right;   HARDWARE REMOVAL Right 12/23/2018   Procedure: RIGHT ANKLE REMOVAL OF DEEP IMPLANTS;  Surgeon: Kit Rush, MD;  Location: [facility] OR;  Service: Orthopedics;  Laterality: Right;   KNEE ARTHROSCOPY Left 1983   ORIF ANKLE FRACTURE Right 04/23/2018   Procedure: OPEN REDUCTION  INTERNAL FIXATION (ORIF) TRIMALLEOLAR ANKLE FRACTURE;  Surgeon: Sharl Selinda Dover, MD;  Location: [facility] ORS;  Service: Orthopedics;  Laterality: Right;  120 mins   ORIF ANKLE FRACTURE Right 05/18/2018   Procedure: Right ankle open reduction internal fixation revision;  Surgeon: Sharl Selinda Dover, MD;  Location: [facility] OR;  Service: Orthopedics;  Laterality: Right;  120 mins   SHUNT EXTERNALIZATION     Head x 2   TIBIA OSTEOTOMY Right 12/23/2018   Procedure: RIGHT MEDIAL MALLEOLUS OSTEOTOMY;  Surgeon: Kit Rush, MD;  Location: [facility] OR;  Service: Orthopedics;  Laterality: Right;   WISDOM TOOTH EXTRACTION     Past Surgical History:  Procedure Laterality Date   ANKLE ARTHROSCOPY Left    related to Tendon   ANKLE FUSION Right 12/23/2018   Procedure: right tibiotalar calcaneal arthrodesis;  Surgeon: Kit Rush, MD;  Location: [facility] OR;  Service: Orthopedics;  Laterality: Right;    ANKLE SURGERY Right 12/23/2018   BACK SURGERY  1971   Spinal Bifida   BACK SURGERY  2012   fusion   EXTERNAL FIXATION LEG Right 05/18/2018   Procedure: EXTERNAL FIXATION LEG;  Surgeon: Sharl Selinda Dover, MD;  Location: [facility] OR;  Service: Orthopedics;  Laterality: Right;   EXTERNAL FIXATION REMOVAL Right 06/14/2018   Procedure: Right ankle external fixator removal with steinman pin placement;  Surgeon: Sharl Selinda Dover, MD;  Location: [facility] OR;  Service: Orthopedics;  Laterality: Right;  60 mins   HARDWARE REMOVAL Right 05/18/2018   Procedure: HARDWARE REMOVAL;  Surgeon: Sharl Selinda Dover, MD;  Location: [facility] OR;  Service: Orthopedics;  Laterality: Right;   HARDWARE REMOVAL Right 12/23/2018   Procedure: RIGHT ANKLE REMOVAL OF DEEP IMPLANTS;  Surgeon: Kit Rush, MD;  Location: [facility] OR;  Service: Orthopedics;  Laterality: Right;   KNEE ARTHROSCOPY Left 1983   ORIF ANKLE FRACTURE Right 04/23/2018   Procedure: OPEN REDUCTION INTERNAL FIXATION (ORIF) TRIMALLEOLAR ANKLE FRACTURE;  Surgeon: Sharl Selinda Dover, MD;   Location: [facility] ORS;  Service: Orthopedics;  Laterality: Right;  120 mins   ORIF ANKLE FRACTURE Right 05/18/2018   Procedure: Right ankle open reduction internal fixation revision;  Surgeon: Sharl Selinda Dover, MD;  Location: [facility] OR;  Service: Orthopedics;  Laterality: Right;  120 mins   SHUNT EXTERNALIZATION     Head x 2   TIBIA OSTEOTOMY Right 12/23/2018   Procedure: RIGHT MEDIAL MALLEOLUS OSTEOTOMY;  Surgeon: Kit Rush, MD;  Location: [facility] OR;  Service: Orthopedics;  Laterality: Right;   WISDOM TOOTH EXTRACTION     Past Medical History:  Diagnosis Date   Chiari malformation type II (HCC)    Family history of adverse reaction to anesthesia    grandfather died from anesthesia; grandfather had malignant hyperthermia   GERD (gastroesophageal reflux disease)    Hydrocephalus (HCC)    Malignant hyperthermia    Neuromuscular disorder (HCC)    Neuropathy   Osteoarthritis    PONV (postoperative nausea and vomiting)    Spina bifida (HCC)    BP 129/87   Pulse 72   Ht 5' (1.524 m)   SpO2 93%   BMI 64.45 kg/m   Opioid Risk Score:   Fall Risk Score:  `1  Depression screen [facility] 2/9     11/29/2024    3:16 PM 10/09/2023    3:18 PM 04/09/2023   10:47 AM 08/21/2022    3:34 PM 03/14/2022    3:35 PM 06/06/2021    3:24 PM 10/19/2020    2:55 PM  Depression screen PHQ 2/9  Decreased Interest 0 0 0 0 0 1 1  Down, Depressed, Hopeless 0 0 0 0 0 1 1  PHQ - 2 Score 0 0 0 0 0 2 2     Review of Systems  Musculoskeletal:        Bilateral arms and right leg and left thigh  All other systems reviewed and are negative.      Objective:   Physical Exam  General no acute distress Morbid obesity Tenderness over bilateral upper traps as well as bilateral lateral epicondyles Motor strength is 5/5 bilateral deltoid, bicep, tricep, grip, 3 - bilateral hip flexor knee extensor ankle dorsiflexor There is no evidence of skin discoloration over the hands.  There is no evidence of joint deformities in the  upper extremities she has no evidence of synovitis in the hands.  No erythema.  No hypersensitivity to touch in the upper  limbs except some mild tenderness at the shoulders over the deltoid as well as the lateral elbow. No pain with range of motion of the neck. Finger thumb opposition is intact No evidence of ataxia in the upper extremities. Ambulation was not tested since she is nonambulatory in a scooter.  There is no evidence of hand intrinsic atrophy.  Sensation is normal to light touch bilateral upper extremities     Assessment & Plan:   Assessment and Plan Assessment & Plan Chronic pain syndrome with possible fibromyalgia Chronic, widespread pain with hypersensitivity and no definitive etiology. Likely fibromyalgia due to chronicity and absence of objective findings. Current pregabalin  dosing ineffective. - Increased pregabalin  to 150 mg twice daily- Scheduled follow-up in six weeks to assess response and adjust therapy as needed.  Chronic right lower extremity lymphedema posttraumatic following trimalleolus fracture Chronic lymphedema in right lower extremity affecting mobility and function. Engaged in physical therapy.  In addition morbid obesity is impacting function with chronic deconditioning - Continued physical therapy for lymphedema management, including wrapping and exercise.  Possible carpal tunnel syndrome Intermittent numbness in hands, particularly left, suggestive of carpal tunnel syndrome. Prior nerve conduction studies performed about a year ago. - Ordered nerve conduction study (EMG/NCV) of the hands to evaluate for carpal tunnel syndrome.   "

## 2025-01-13 ENCOUNTER — Encounter: Admitting: Physical Medicine & Rehabilitation
